# Patient Record
Sex: Male | Born: 1950
Health system: Southern US, Community
[De-identification: ages and names within clinical notes are randomized; demographics above are authoritative.]

## PROBLEM LIST (undated history)

## (undated) DIAGNOSIS — J019 Acute sinusitis, unspecified: Secondary | ICD-10-CM

## (undated) DIAGNOSIS — E119 Type 2 diabetes mellitus without complications: Secondary | ICD-10-CM

## (undated) DIAGNOSIS — Z8711 Personal history of peptic ulcer disease: Secondary | ICD-10-CM

## (undated) DIAGNOSIS — F329 Major depressive disorder, single episode, unspecified: Secondary | ICD-10-CM

## (undated) DIAGNOSIS — L989 Disorder of the skin and subcutaneous tissue, unspecified: Secondary | ICD-10-CM

## (undated) DIAGNOSIS — K279 Peptic ulcer, site unspecified, unspecified as acute or chronic, without hemorrhage or perforation: Secondary | ICD-10-CM

## (undated) DIAGNOSIS — C801 Malignant (primary) neoplasm, unspecified: Secondary | ICD-10-CM

## (undated) DIAGNOSIS — R197 Diarrhea, unspecified: Secondary | ICD-10-CM

## (undated) DIAGNOSIS — E785 Hyperlipidemia, unspecified: Secondary | ICD-10-CM

## (undated) DIAGNOSIS — F411 Generalized anxiety disorder: Secondary | ICD-10-CM

## (undated) DIAGNOSIS — G4700 Insomnia, unspecified: Secondary | ICD-10-CM

## (undated) DIAGNOSIS — E663 Overweight: Secondary | ICD-10-CM

## (undated) DIAGNOSIS — I10 Essential (primary) hypertension: Secondary | ICD-10-CM

## (undated) DIAGNOSIS — I82409 Acute embolism and thrombosis of unspecified deep veins of unspecified lower extremity: Secondary | ICD-10-CM

## (undated) DIAGNOSIS — Z9889 Other specified postprocedural states: Secondary | ICD-10-CM

## (undated) DIAGNOSIS — R112 Nausea with vomiting, unspecified: Secondary | ICD-10-CM

## (undated) HISTORY — DX: Type 2 diabetes mellitus without complications: E11.9

## (undated) HISTORY — DX: Nausea with vomiting, unspecified: R11.2

## (undated) HISTORY — DX: Personal history of peptic ulcer disease: Z87.11

## (undated) HISTORY — DX: Major depressive disorder, single episode, unspecified: F32.9

## (undated) HISTORY — DX: Generalized anxiety disorder: F41.1

## (undated) HISTORY — DX: Peptic ulcer, site unspecified, unspecified as acute or chronic, without hemorrhage or perforation: K27.9

## (undated) HISTORY — DX: Diarrhea, unspecified: R19.7

## (undated) HISTORY — DX: Acute sinusitis, unspecified: J01.90

## (undated) HISTORY — DX: Malignant (primary) neoplasm, unspecified: C80.1

## (undated) HISTORY — DX: Other specified postprocedural states: Z98.890

## (undated) HISTORY — DX: Essential (primary) hypertension: I10

## (undated) HISTORY — DX: Insomnia, unspecified: G47.00

## (undated) HISTORY — DX: Hyperlipidemia, unspecified: E78.5

## (undated) HISTORY — PX: OTHER SURGICAL HISTORY: SHX169

## (undated) HISTORY — DX: Acute embolism and thrombosis of unspecified deep veins of unspecified lower extremity: I82.409

## (undated) HISTORY — DX: Overweight: E66.3

## (undated) HISTORY — DX: Disorder of the skin and subcutaneous tissue, unspecified: L98.9

---

## 2000-02-12 ENCOUNTER — Encounter: Payer: Self-pay | Admitting: General Surgery

## 2000-02-12 ENCOUNTER — Encounter: Admission: RE | Admit: 2000-02-12 | Discharge: 2000-02-12 | Payer: Self-pay | Admitting: General Surgery

## 2000-05-02 ENCOUNTER — Encounter (INDEPENDENT_AMBULATORY_CARE_PROVIDER_SITE_OTHER): Payer: Self-pay

## 2000-05-02 ENCOUNTER — Inpatient Hospital Stay (HOSPITAL_COMMUNITY): Admission: RE | Admit: 2000-05-02 | Discharge: 2000-05-07 | Payer: Self-pay | Admitting: General Surgery

## 2004-07-17 ENCOUNTER — Ambulatory Visit: Payer: Self-pay | Admitting: Endocrinology

## 2004-10-01 ENCOUNTER — Encounter: Admission: RE | Admit: 2004-10-01 | Discharge: 2004-10-01 | Payer: Self-pay | Admitting: Internal Medicine

## 2005-05-21 ENCOUNTER — Ambulatory Visit: Payer: Self-pay | Admitting: Internal Medicine

## 2006-03-15 ENCOUNTER — Ambulatory Visit: Payer: Self-pay | Admitting: Internal Medicine

## 2006-06-16 ENCOUNTER — Ambulatory Visit: Payer: Self-pay | Admitting: Internal Medicine

## 2006-06-16 LAB — CONVERTED CEMR LAB
ALT: 37 units/L (ref 0–40)
AST: 32 units/L (ref 0–37)
Albumin: 4.2 g/dL (ref 3.5–5.2)
Alkaline Phosphatase: 56 units/L (ref 39–117)
BUN: 13 mg/dL (ref 6–23)
Basophils Absolute: 0 10*3/uL (ref 0.0–0.1)
Basophils Relative: 0.5 % (ref 0.0–1.0)
Bilirubin Urine: NEGATIVE
Bilirubin, Direct: 0.2 mg/dL (ref 0.0–0.3)
CO2: 29 meq/L (ref 19–32)
Calcium: 9.3 mg/dL (ref 8.4–10.5)
Chloride: 103 meq/L (ref 96–112)
Cholesterol: 140 mg/dL (ref 0–200)
Creatinine, Ser: 0.8 mg/dL (ref 0.4–1.5)
Eosinophils Absolute: 0.2 10*3/uL (ref 0.0–0.6)
Eosinophils Relative: 2.2 % (ref 0.0–5.0)
GFR calc Af Amer: 129 mL/min
GFR calc non Af Amer: 107 mL/min
Glucose, Bld: 111 mg/dL — ABNORMAL HIGH (ref 70–99)
HCT: 49 % (ref 39.0–52.0)
HDL: 38.8 mg/dL — ABNORMAL LOW (ref >39.0)
Hemoglobin, Urine: NEGATIVE
Hemoglobin: 17.3 g/dL — ABNORMAL HIGH (ref 13.0–17.0)
Hgb A1c MFr Bld: 5.3 % (ref 4.6–6.0)
Ketones, ur: NEGATIVE mg/dL
LDL Cholesterol: 79 mg/dL (ref 0–99)
Leukocytes, UA: NEGATIVE
Lymphocytes Relative: 17.8 % (ref 12.0–46.0)
MCHC: 35.2 g/dL (ref 30.0–36.0)
MCV: 91.8 fL (ref 78.0–100.0)
Monocytes Absolute: 0.8 10*3/uL — ABNORMAL HIGH (ref 0.2–0.7)
Monocytes Relative: 8.5 % (ref 3.0–11.0)
Neutro Abs: 7.1 10*3/uL (ref 1.4–7.7)
Neutrophils Relative %: 71 % (ref 43.0–77.0)
Nitrite: NEGATIVE
PSA: 1.66 ng/mL (ref 0.10–4.00)
Platelets: 232 10*3/uL (ref 150–400)
Potassium: 3.4 meq/L — ABNORMAL LOW (ref 3.5–5.1)
RBC: 5.34 M/uL (ref 4.22–5.81)
RDW: 11.5 % (ref 11.5–14.6)
Sodium: 140 meq/L (ref 135–145)
Specific Gravity, Urine: 1.015 (ref 1.000–1.03)
TSH: 0.86 microintl units/mL (ref 0.35–5.50)
Total Bilirubin: 1.4 mg/dL — ABNORMAL HIGH (ref 0.3–1.2)
Total CHOL/HDL Ratio: 3.6
Total Protein, Urine: NEGATIVE mg/dL
Total Protein: 7.2 g/dL (ref 6.0–8.3)
Triglycerides: 112 mg/dL (ref 0–149)
Urine Glucose: NEGATIVE mg/dL
Urobilinogen, UA: 0.2 (ref 0.0–1.0)
VLDL: 22 mg/dL (ref 0–40)
WBC: 9.8 10*3/uL (ref 4.5–10.5)
pH: 7 (ref 5.0–8.0)

## 2006-08-05 ENCOUNTER — Ambulatory Visit: Payer: Self-pay | Admitting: Internal Medicine

## 2006-11-17 ENCOUNTER — Encounter: Payer: Self-pay | Admitting: Internal Medicine

## 2006-11-17 DIAGNOSIS — I10 Essential (primary) hypertension: Secondary | ICD-10-CM

## 2006-11-17 DIAGNOSIS — Z8711 Personal history of peptic ulcer disease: Secondary | ICD-10-CM

## 2006-11-17 DIAGNOSIS — E663 Overweight: Secondary | ICD-10-CM

## 2006-11-17 HISTORY — DX: Overweight: E66.3

## 2006-11-17 HISTORY — DX: Essential (primary) hypertension: I10

## 2006-11-17 HISTORY — DX: Personal history of peptic ulcer disease: Z87.11

## 2006-11-21 DIAGNOSIS — E785 Hyperlipidemia, unspecified: Secondary | ICD-10-CM

## 2006-11-21 DIAGNOSIS — F411 Generalized anxiety disorder: Secondary | ICD-10-CM

## 2006-11-21 HISTORY — DX: Generalized anxiety disorder: F41.1

## 2006-11-21 HISTORY — DX: Hyperlipidemia, unspecified: E78.5

## 2007-07-21 ENCOUNTER — Ambulatory Visit: Payer: Self-pay | Admitting: Internal Medicine

## 2007-07-21 DIAGNOSIS — G4700 Insomnia, unspecified: Secondary | ICD-10-CM

## 2007-07-21 HISTORY — DX: Insomnia, unspecified: G47.00

## 2007-07-22 DIAGNOSIS — K279 Peptic ulcer, site unspecified, unspecified as acute or chronic, without hemorrhage or perforation: Secondary | ICD-10-CM

## 2007-07-22 DIAGNOSIS — E119 Type 2 diabetes mellitus without complications: Secondary | ICD-10-CM

## 2007-07-22 DIAGNOSIS — R7303 Prediabetes: Secondary | ICD-10-CM

## 2007-07-22 HISTORY — DX: Type 2 diabetes mellitus without complications: E11.9

## 2007-07-22 HISTORY — DX: Peptic ulcer, site unspecified, unspecified as acute or chronic, without hemorrhage or perforation: K27.9

## 2007-07-24 LAB — CONVERTED CEMR LAB
ALT: 31 units/L (ref 0–53)
AST: 25 units/L (ref 0–37)
Albumin: 4.3 g/dL (ref 3.5–5.2)
Alkaline Phosphatase: 48 units/L (ref 39–117)
BUN: 15 mg/dL (ref 6–23)
Basophils Absolute: 0 10*3/uL (ref 0.0–0.1)
Basophils Relative: 0 % (ref 0.0–1.0)
Bilirubin Urine: NEGATIVE
Bilirubin, Direct: 0.1 mg/dL (ref 0.0–0.3)
CO2: 32 meq/L (ref 19–32)
Calcium: 9.6 mg/dL (ref 8.4–10.5)
Chloride: 106 meq/L (ref 96–112)
Cholesterol: 139 mg/dL (ref 0–200)
Creatinine, Ser: 0.8 mg/dL (ref 0.4–1.5)
Eosinophils Absolute: 0.1 10*3/uL (ref 0.0–0.7)
Eosinophils Relative: 1.8 % (ref 0.0–5.0)
GFR calc Af Amer: 128 mL/min
GFR calc non Af Amer: 106 mL/min
Glucose, Bld: 100 mg/dL — ABNORMAL HIGH (ref 70–99)
HCT: 47.4 % (ref 39.0–52.0)
HDL: 36.1 mg/dL — ABNORMAL LOW (ref >39.0)
Hemoglobin, Urine: NEGATIVE
Hemoglobin: 16.1 g/dL (ref 13.0–17.0)
Hgb A1c MFr Bld: 5.5 % (ref 4.6–6.0)
Ketones, ur: NEGATIVE mg/dL
LDL Cholesterol: 78 mg/dL (ref 0–99)
Leukocytes, UA: NEGATIVE
Lymphocytes Relative: 16.8 % (ref 12.0–46.0)
MCHC: 33.9 g/dL (ref 30.0–36.0)
MCV: 94.9 fL (ref 78.0–100.0)
Monocytes Absolute: 0.9 10*3/uL (ref 0.1–1.0)
Monocytes Relative: 10.7 % (ref 3.0–12.0)
Neutro Abs: 5.7 10*3/uL (ref 1.4–7.7)
Neutrophils Relative %: 70.7 % (ref 43.0–77.0)
Nitrite: NEGATIVE
PSA: 1.64 ng/mL (ref 0.10–4.00)
Platelets: 208 10*3/uL (ref 150–400)
Potassium: 3.2 meq/L — ABNORMAL LOW (ref 3.5–5.1)
RBC: 5 M/uL (ref 4.22–5.81)
RDW: 12.4 % (ref 11.5–14.6)
Sodium: 142 meq/L (ref 135–145)
Specific Gravity, Urine: 1.015 (ref 1.000–1.03)
TSH: 0.85 microintl units/mL (ref 0.35–5.50)
Total Bilirubin: 1.1 mg/dL (ref 0.3–1.2)
Total CHOL/HDL Ratio: 3.9
Total Protein, Urine: NEGATIVE mg/dL
Total Protein: 7.5 g/dL (ref 6.0–8.3)
Triglycerides: 126 mg/dL (ref 0–149)
Urine Glucose: NEGATIVE mg/dL
Urobilinogen, UA: 0.2 (ref 0.0–1.0)
VLDL: 25 mg/dL (ref 0–40)
WBC: 8 10*3/uL (ref 4.5–10.5)
pH: 8 (ref 5.0–8.0)

## 2007-07-25 ENCOUNTER — Encounter: Payer: Self-pay | Admitting: Internal Medicine

## 2007-07-26 ENCOUNTER — Telehealth: Payer: Self-pay | Admitting: Internal Medicine

## 2007-07-30 ENCOUNTER — Encounter: Payer: Self-pay | Admitting: Internal Medicine

## 2007-08-02 ENCOUNTER — Encounter: Payer: Self-pay | Admitting: Internal Medicine

## 2007-08-04 ENCOUNTER — Telehealth: Payer: Self-pay | Admitting: Internal Medicine

## 2007-12-08 ENCOUNTER — Ambulatory Visit: Payer: Self-pay | Admitting: Internal Medicine

## 2007-12-08 DIAGNOSIS — J019 Acute sinusitis, unspecified: Secondary | ICD-10-CM

## 2007-12-08 DIAGNOSIS — L989 Disorder of the skin and subcutaneous tissue, unspecified: Secondary | ICD-10-CM

## 2007-12-08 HISTORY — DX: Disorder of the skin and subcutaneous tissue, unspecified: L98.9

## 2007-12-08 HISTORY — DX: Acute sinusitis, unspecified: J01.90

## 2008-02-25 IMAGING — CR DG CHEST 2V
2 series · 2 of 2 positions shown · non-contrast
Comparison: 07/17/04.

CLINICAL DATA: Cough and fever. 
 CHEST - 2 VIEW:

[view not recorded (1 of 2)]
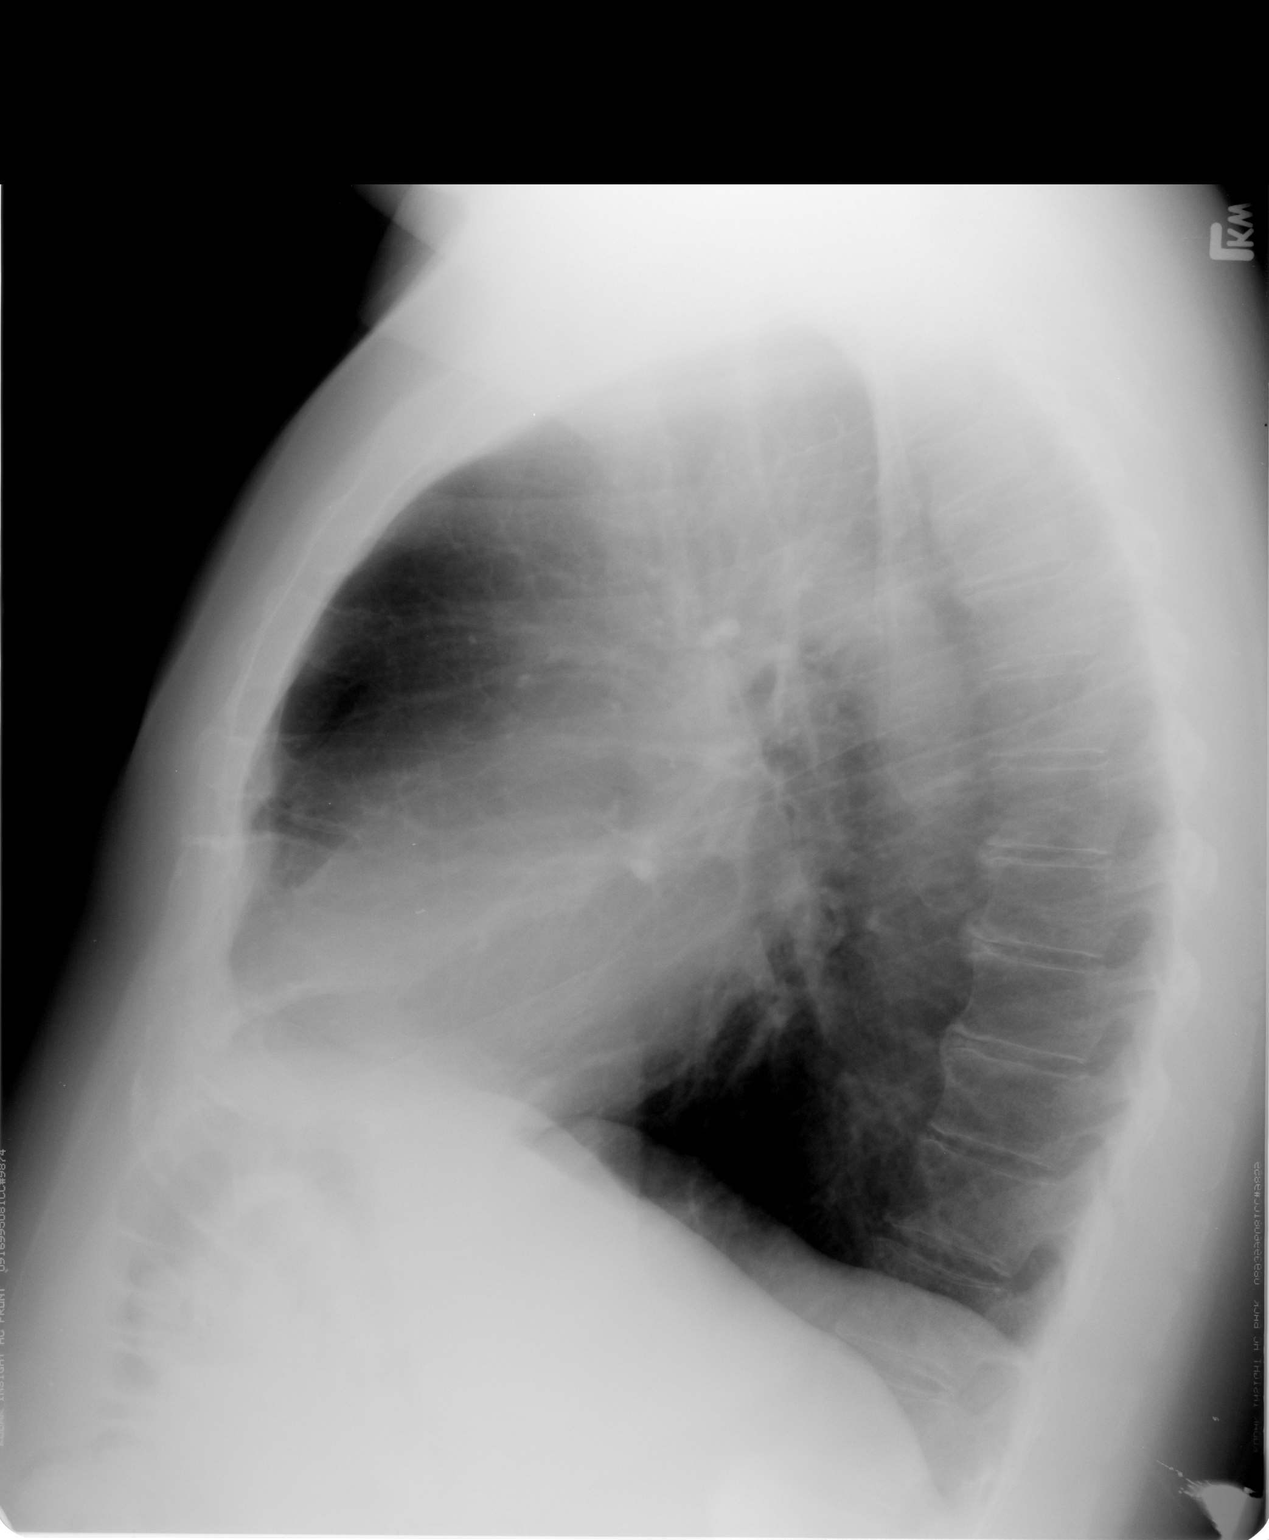

[view not recorded (2 of 2)]
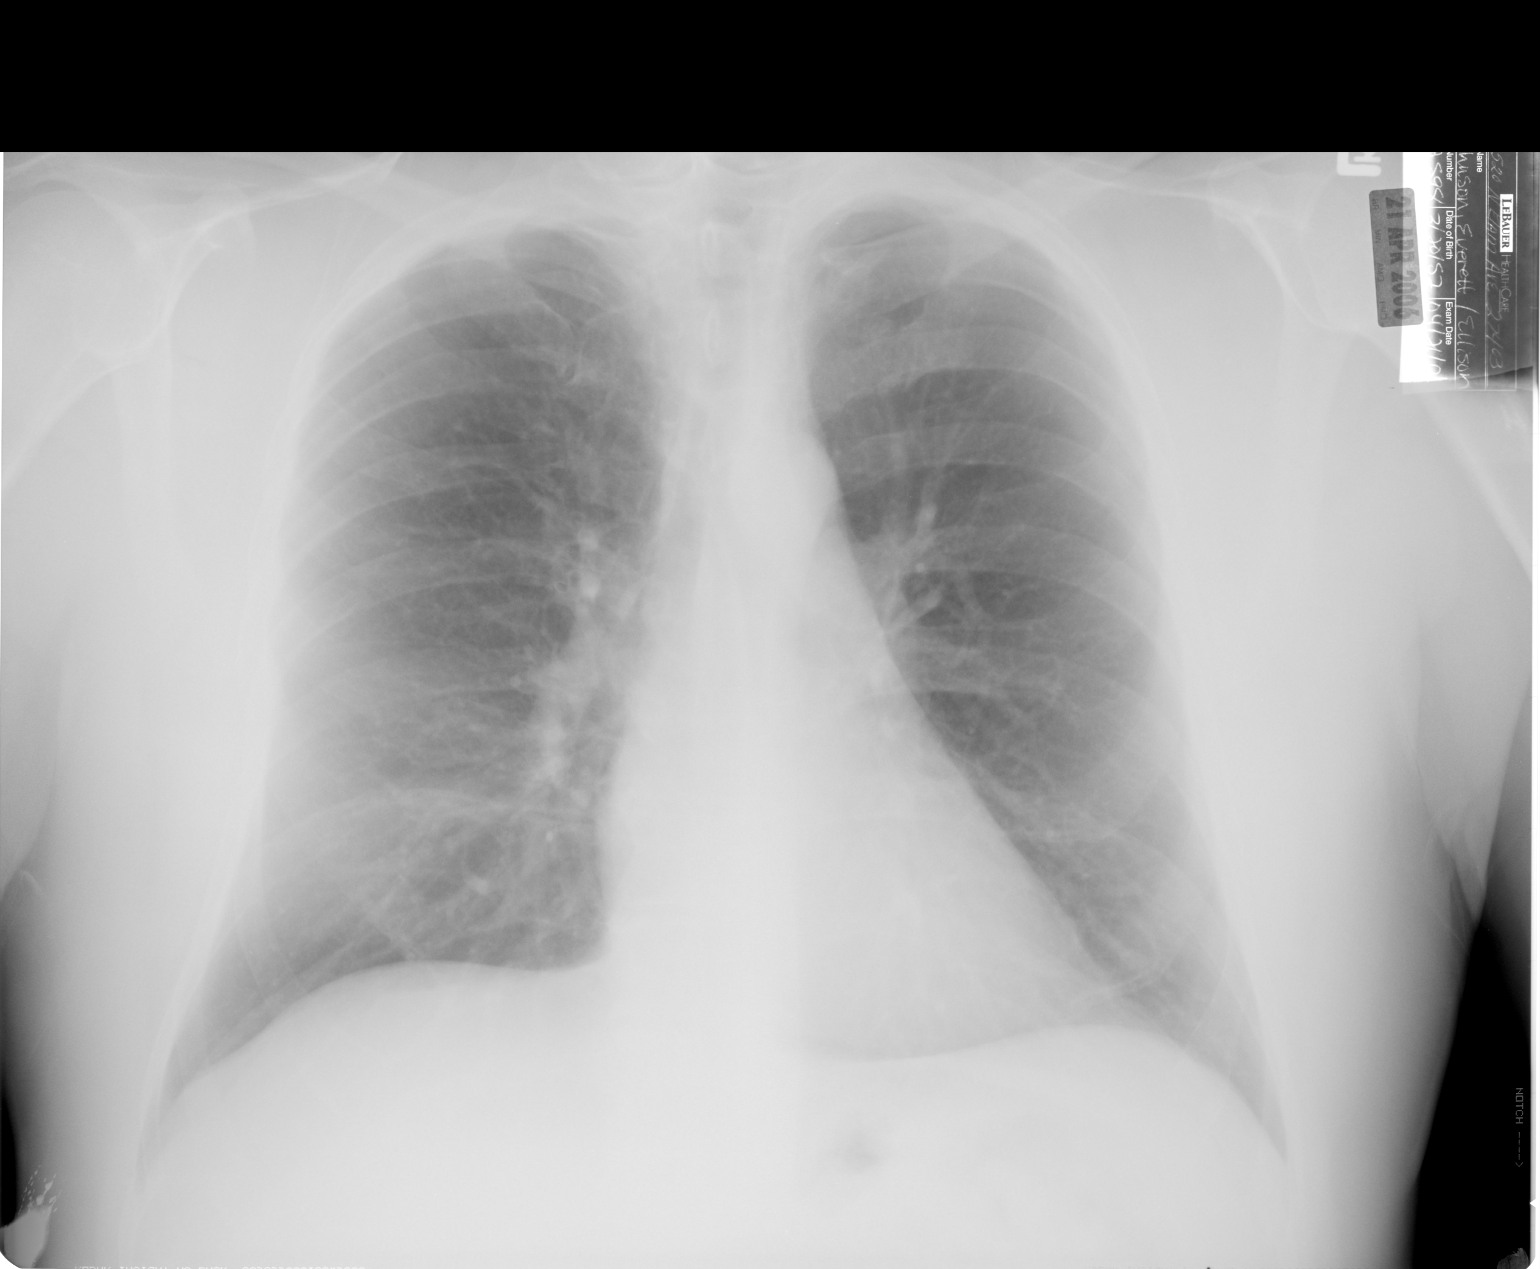

[2 of 2 positions shown; findings below may reference images not displayed]

FINDINGS: The lungs are clear.  Heart size is normal.   No effusion or focal bony abnormality.
IMPRESSION: No acute disease.  Stable compared to prior exam.

## 2008-04-16 ENCOUNTER — Telehealth: Payer: Self-pay | Admitting: Internal Medicine

## 2008-04-18 ENCOUNTER — Telehealth: Payer: Self-pay | Admitting: Internal Medicine

## 2008-05-31 ENCOUNTER — Ambulatory Visit: Payer: Self-pay | Admitting: Internal Medicine

## 2008-05-31 ENCOUNTER — Telehealth: Payer: Self-pay | Admitting: Internal Medicine

## 2008-05-31 DIAGNOSIS — F3289 Other specified depressive episodes: Secondary | ICD-10-CM

## 2008-05-31 DIAGNOSIS — F329 Major depressive disorder, single episode, unspecified: Secondary | ICD-10-CM

## 2008-05-31 DIAGNOSIS — R197 Diarrhea, unspecified: Secondary | ICD-10-CM | POA: Insufficient documentation

## 2008-05-31 HISTORY — DX: Other specified depressive episodes: F32.89

## 2008-05-31 HISTORY — DX: Major depressive disorder, single episode, unspecified: F32.9

## 2008-05-31 HISTORY — DX: Diarrhea, unspecified: R19.7

## 2008-07-29 ENCOUNTER — Ambulatory Visit: Payer: Self-pay | Admitting: Internal Medicine

## 2008-07-29 ENCOUNTER — Encounter: Payer: Self-pay | Admitting: Internal Medicine

## 2008-07-30 ENCOUNTER — Encounter: Payer: Self-pay | Admitting: Internal Medicine

## 2008-07-30 LAB — CONVERTED CEMR LAB
ALT: 27 units/L (ref 0–53)
AST: 22 units/L (ref 0–37)
Albumin: 4.4 g/dL (ref 3.5–5.2)
Alkaline Phosphatase: 60 units/L (ref 39–117)
BUN: 18 mg/dL (ref 6–23)
Basophils Absolute: 0 10*3/uL (ref 0.0–0.1)
Basophils Relative: 0.2 % (ref 0.0–3.0)
Bilirubin Urine: NEGATIVE
Bilirubin, Direct: 0.2 mg/dL (ref 0.0–0.3)
CO2: 34 meq/L — ABNORMAL HIGH (ref 19–32)
Calcium: 9.9 mg/dL (ref 8.4–10.5)
Chloride: 102 meq/L (ref 96–112)
Cholesterol: 183 mg/dL (ref 0–200)
Creatinine, Ser: 0.9 mg/dL (ref 0.4–1.5)
Eosinophils Absolute: 0.2 10*3/uL (ref 0.0–0.7)
Eosinophils Relative: 1.4 % (ref 0.0–5.0)
GFR calc non Af Amer: 92.08 mL/min (ref >60)
Glucose, Bld: 107 mg/dL — ABNORMAL HIGH (ref 70–99)
HCT: 48.6 % (ref 39.0–52.0)
HDL: 37.2 mg/dL — ABNORMAL LOW (ref >39.00)
Hemoglobin, Urine: NEGATIVE
Hemoglobin: 17.5 g/dL — ABNORMAL HIGH (ref 13.0–17.0)
Ketones, ur: NEGATIVE mg/dL
LDL Cholesterol: 112 mg/dL — ABNORMAL HIGH (ref 0–99)
Leukocytes, UA: NEGATIVE
Lymphocytes Relative: 8.7 % — ABNORMAL LOW (ref 12.0–46.0)
Lymphs Abs: 1.1 10*3/uL (ref 0.7–4.0)
MCHC: 36 g/dL (ref 30.0–36.0)
MCV: 94.5 fL (ref 78.0–100.0)
Monocytes Absolute: 1 10*3/uL (ref 0.1–1.0)
Monocytes Relative: 7.8 % (ref 3.0–12.0)
Neutro Abs: 10.2 10*3/uL — ABNORMAL HIGH (ref 1.4–7.7)
Neutrophils Relative %: 81.9 % — ABNORMAL HIGH (ref 43.0–77.0)
Nitrite: NEGATIVE
PSA: 2.1 ng/mL (ref 0.10–4.00)
Platelets: 198 10*3/uL (ref 150.0–400.0)
Potassium: 3.6 meq/L (ref 3.5–5.1)
RBC: 5.14 M/uL (ref 4.22–5.81)
RDW: 12.4 % (ref 11.5–14.6)
Sodium: 140 meq/L (ref 135–145)
Specific Gravity, Urine: 1.015 (ref 1.000–1.030)
TSH: 1.03 microintl units/mL (ref 0.35–5.50)
Total Bilirubin: 1.5 mg/dL — ABNORMAL HIGH (ref 0.3–1.2)
Total CHOL/HDL Ratio: 5
Total Protein, Urine: NEGATIVE mg/dL
Total Protein: 7.9 g/dL (ref 6.0–8.3)
Triglycerides: 170 mg/dL — ABNORMAL HIGH (ref 0.0–149.0)
Urine Glucose: NEGATIVE mg/dL
Urobilinogen, UA: 2 (ref 0.0–1.0)
VLDL: 34 mg/dL (ref 0.0–40.0)
WBC: 12.5 10*3/uL — ABNORMAL HIGH (ref 4.5–10.5)
pH: 7 (ref 5.0–8.0)

## 2008-08-05 ENCOUNTER — Ambulatory Visit: Payer: Self-pay | Admitting: Internal Medicine

## 2008-08-05 LAB — CONVERTED CEMR LAB
Basophils Absolute: 0 10*3/uL (ref 0.0–0.1)
Basophils Relative: 0.1 % (ref 0.0–3.0)
Eosinophils Absolute: 0.1 10*3/uL (ref 0.0–0.7)
Eosinophils Relative: 0.9 % (ref 0.0–5.0)
HCT: 45.4 % (ref 39.0–52.0)
Hemoglobin: 16.2 g/dL (ref 13.0–17.0)
Lymphocytes Relative: 9.7 % — ABNORMAL LOW (ref 12.0–46.0)
Lymphs Abs: 0.9 10*3/uL (ref 0.7–4.0)
MCHC: 35.8 g/dL (ref 30.0–36.0)
MCV: 95.1 fL (ref 78.0–100.0)
Monocytes Absolute: 0.7 10*3/uL (ref 0.1–1.0)
Monocytes Relative: 6.8 % (ref 3.0–12.0)
Neutro Abs: 8 10*3/uL — ABNORMAL HIGH (ref 1.4–7.7)
Neutrophils Relative %: 82.5 % — ABNORMAL HIGH (ref 43.0–77.0)
Platelets: 208 10*3/uL (ref 150.0–400.0)
RBC: 4.78 M/uL (ref 4.22–5.81)
RDW: 12.3 % (ref 11.5–14.6)
WBC: 9.7 10*3/uL (ref 4.5–10.5)

## 2008-10-01 ENCOUNTER — Telehealth (INDEPENDENT_AMBULATORY_CARE_PROVIDER_SITE_OTHER): Payer: Self-pay | Admitting: *Deleted

## 2009-04-16 ENCOUNTER — Encounter: Payer: Self-pay | Admitting: Internal Medicine

## 2009-05-02 ENCOUNTER — Encounter: Payer: Self-pay | Admitting: Internal Medicine

## 2009-07-17 ENCOUNTER — Telehealth: Payer: Self-pay | Admitting: Internal Medicine

## 2009-11-03 ENCOUNTER — Ambulatory Visit: Payer: Self-pay | Admitting: Internal Medicine

## 2009-11-03 LAB — CONVERTED CEMR LAB
ALT: 26 units/L (ref 0–53)
Alkaline Phosphatase: 63 units/L (ref 39–117)
BUN: 15 mg/dL (ref 6–23)
Basophils Relative: 0.3 % (ref 0.0–3.0)
Bilirubin, Direct: 0.2 mg/dL (ref 0.0–0.3)
CO2: 30 meq/L (ref 19–32)
Chloride: 102 meq/L (ref 96–112)
GFR calc non Af Amer: 92.87 mL/min (ref >60)
Glucose, Bld: 94 mg/dL (ref 70–99)
HCT: 51.2 % (ref 39.0–52.0)
HDL: 32.6 mg/dL — ABNORMAL LOW (ref >39.00)
Hemoglobin, Urine: NEGATIVE
Hemoglobin: 17.9 g/dL — ABNORMAL HIGH (ref 13.0–17.0)
MCHC: 35 g/dL (ref 30.0–36.0)
MCV: 94.2 fL (ref 78.0–100.0)
Monocytes Relative: 9.8 % (ref 3.0–12.0)
Platelets: 213 10*3/uL (ref 150.0–400.0)
Potassium: 3.8 meq/L (ref 3.5–5.1)
RDW: 12.7 % (ref 11.5–14.6)
Sodium: 141 meq/L (ref 135–145)
TSH: 0.99 microintl units/mL (ref 0.35–5.50)
Total CHOL/HDL Ratio: 6
Total Protein: 7.8 g/dL (ref 6.0–8.3)
Urine Glucose: NEGATIVE mg/dL
Urobilinogen, UA: 0.2 (ref 0.0–1.0)

## 2010-04-28 NOTE — Letter (Signed)
Summary: [facility]  [facility]   Imported By: Sherian Rein 05/14/2009 15:17:15  _____________________________________________________________________  External Attachment:    Type:   Image     Comment:   External Document

## 2010-04-28 NOTE — Assessment & Plan Note (Signed)
Summary: F/U APPT PER PT/#/CD   Vital Signs:  Patient profile:   60 year old male Height:      69 inches Weight:      233 pounds BMI:     34.53 O2 Sat:      96 % on Room air Temp:     98.6 degrees F oral Pulse rate:   55 / minute BP sitting:   128 / 80  (left arm) Cuff size:   large  Vitals Entered By: Zella Ball Ewing CMA (AAMA) (November 03, 2009 10:18 AM)  O2 Flow:  Room air  Preventive Care Screening     decliens colonoscopy  CC: followup/RE Pain Assessment Patient in pain? no        CC:  followup/RE.  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  Still with chronic LLE sweling and vein incompteance so he spent $5000 to fix the vein.  Trying to follow lower chol diet and vytorin now too expensvie.    Problems Prior to Update: 1)  Preventive Health Care  (ICD-V70.0) 2)  Depression  (ICD-311) 3)  Diarrhea  (ICD-787.91) 4)  Skin Lesion  (ICD-709.9) 5)  Sinusitis- Acute-nos  (ICD-461.9) 6)  Diabetes Mellitus, Type II  (ICD-250.00) 7)  Peptic Ulcer Disease  (ICD-533.90) 8)  Insomnia-sleep Disorder-unspec  (ICD-780.52) 9)  Preventive Health Care  (ICD-V70.0) 10)  Anxiety  (ICD-300.00) 11)  Obesity, Mild  (ICD-278.02) 12)  Peptic Ulcer Disease, Hx of  (ICD-V12.71) 13)  Hyperlipidemia  (ICD-272.4) 14)  Hypertension  (ICD-401.9)  Medications Prior to Update: 1)  Ecotrin Low Strength 81 Mg  Tbec (Aspirin) .Marland Kitchen.. 1 By Mouth Once Daily 2)  Vytorin 10-40 Mg  Tabs (Ezetimibe-Simvastatin) .Marland Kitchen.. 1 By Mouth Once Daily 3)  Atenolol-Chlorthalidone 100-25 Mg  Tabs (Atenolol-Chlorthalidone) .Marland Kitchen.. 1 By Mouth Once Daily 4)  Ambien Cr 12.5 Mg  Cr-Tabs (Zolpidem Tartrate) .Marland Kitchen.. 1po At Bedtime As Needed 5)  Klor-Con M10 10 Meq  Tbcr (Potassium Chloride Crys Cr) .Marland Kitchen.. 1 By Mouth Once Daily 6)  Amlodipine Besy-Benazepril Hcl 5-20 Mg Caps (Amlodipine Besy-Benazepril Hcl) .... Take 2 Capsule By Mouth Once A Day 7)  Fluoxetine Hcl 20 Mg Tabs (Fluoxetine Hcl) .Marland Kitchen.. 1po Once Daily 8)  Amlodipine Besylate 5 Mg Tabs (Amlodipine Besylate) .Marland Kitchen.. 1 By Mouth Once Daily 9)  Benazepril Hcl 20 Mg Tabs (Benazepril Hcl) .Marland Kitchen.. 1 By Mouth Once Daily 10)  Amlodipine Besylate 10 Mg Tabs (Amlodipine Besylate) .Marland Kitchen.. 1 By Mouth Once Daily 11)  Benazepril Hcl 40 Mg Tabs (Benazepril Hcl) .Marland Kitchen.. 1 By Mouth Once Daily  Current Medications (verified): 1)  Ecotrin Low Strength 81 Mg  Tbec (Aspirin) .Marland Kitchen.. 1 By Mouth Once Daily 2)  Simvastatin 20 Mg Tabs (Simvastatin) .Marland Kitchen.. 1po Once Daily 3)  Atenolol-Chlorthalidone 100-25 Mg  Tabs (Atenolol-Chlorthalidone) .Marland Kitchen.. 1 By Mouth Once Daily 4)  Klor-Con M10 10 Meq  Tbcr (Potassium Chloride Crys Cr) .Marland Kitchen.. 1 By Mouth Once Daily 5)  Fluoxetine Hcl 20 Mg Tabs (Fluoxetine Hcl) .Marland Kitchen.. 1po Once Daily 6)  Amlodipine Besylate 10 Mg Tabs (Amlodipine Besylate) .Marland Kitchen.. 1 By Mouth Once Daily 7)  Benazepril Hcl 40 Mg  Tabs (Benazepril Hcl) .Marland Kitchen.. 1 By Mouth Once Daily  Allergies (verified): 1)  ! Lipitor 2)  ! Pravachol 3)  ! 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         all otherwise negative per pt -    Physical Exam  General:  alert and overweight-appearing.   Head:  normocephalic and atraumatic.   Eyes:  vision grossly intact, pupils equal, and pupils round.   Ears:  R ear normal and L ear normal.   Nose:  no external deformity and no nasal discharge.   Mouth:  no gingival abnormalities and pharynx pink and moist.   Neck:  supple and no masses.   Lungs:  normal respiratory effort and normal breath sounds.   Heart:  normal rate and regular rhythm.   Abdomen:  soft, non-tender, and normal bowel sounds.   Msk:  no joint tenderness and no joint swelling.   Extremities:  no edema, no erythema  Neurologic:  cranial nerves II-XII intact and strength normal in all extremities.   Skin:  color normal and no rashes.   Psych:  normally interactive, good eye contact, and moderately anxious.     Impression & Recommendations:  Problem # 1:  Preventive Health Care (ICD-V70.0)  Overall doing well, age appropriate education and counseling updated and referral for appropriate preventive services done unless declined, immunizations up to date or declined, diet counseling done if overweight, urged to quit smoking if smokes , most recent labs reviewed and  current ordered if appropriate, ecg reviewed or declined (interpretation per ECG scanned in the EMR if done); information regarding Medicare Prevention requirements given if appropriate; speciality referrals updated as appropriate   Orders: TLB-BMP (Basic Metabolic Panel-BMET) (80048-METABOL) TLB-CBC Platelet - w/Differential (85025-CBCD) TLB-Hepatic/Liver Function Pnl (80076-HEPATIC) TLB-Lipid Panel (80061-LIPID) TLB-TSH (Thyroid Stimulating Hormone) (84443-TSH) TLB-PSA (Prostate Specific Antigen) (84153-PSA) TLB-Udip ONLY (81003-UDIP)  Problem # 2:  HYPERLIPIDEMIA (ICD-272.4)  His updated medication list for this problem includes:    Simvastatin 20 Mg Tabs (Simvastatin) .Marland Kitchen... 1po once daily treat as above, f/u any worsening signs or symptoms  - lower dose due to concomitant amlodipine  Complete Medication List: 1)  Ecotrin Low Strength 81 Mg Tbec (Aspirin) .Marland Kitchen.. 1 by mouth once daily 2)  Simvastatin 20 Mg Tabs (Simvastatin) .Marland Kitchen.. 1po once daily 3)  Atenolol-chlorthalidone 100-25 Mg Tabs (Atenolol-chlorthalidone) .Marland Kitchen.. 1 by mouth once daily 4)  Klor-con M10 10 Meq Tbcr (Potassium chloride crys cr) .Marland Kitchen.. 1 by  mouth once daily 5)  Fluoxetine Hcl 20 Mg Tabs (Fluoxetine hcl) .Marland Kitchen.. 1po once daily 6)  Amlodipine Besylate 10 Mg Tabs (Amlodipine besylate) .Marland Kitchen.. 1 by mouth once daily 7)  Benazepril Hcl 40 Mg Tabs (Benazepril hcl) .Marland Kitchen.. 1 by mouth once daily  Patient Instructions: 1)  stop the vytorin 2)  start the simvastatin at 20 mg per day 3)  Continue all other previous medications as before this visit 4)  Please go to the Lab in the basement for your blood and/or urine tests today 5)  Please schedule a follow-up appointment in 1 year or sooner if needed 6)  Check your Blood Pressure regularly. If it is above 140/90: you should make an appointment. Prescriptions: BENAZEPRIL HCL 40 MG TABS (BENAZEPRIL HCL) 1 by mouth once daily  #90 x 3   Entered and Authorized by:   [name] MD   Signed  by:   [name] MD on 11/03/2009   Method used:   Print then Give to Patient   RxID:   [id number] AMLODIPINE BESYLATE 10 MG TABS (AMLODIPINE BESYLATE) 1 by mouth once daily  #90 x 3   Entered and Authorized by:   [name] MD   Signed by:   [name] MD on 11/03/2009   Method used:   Print then Give to Patient   RxID:   [id number] BENAZEPRIL HCL 20 MG TABS (BENAZEPRIL HCL) 1 by mouth once daily  #90 x 3   Entered and Authorized by:   [name] MD   Signed by:   [name] MD on 11/03/2009   Method used:   Print then Give to Patient   RxID:   [id number] AMLODIPINE BESYLATE 5 MG TABS (AMLODIPINE BESYLATE) 1 by mouth once daily  #90 x 3   Entered and Authorized by:   [name] MD   Signed by:   [name] MD on 11/03/2009   Method used:   Print then Give to Patient   RxID:   [id number] FLUOXETINE HCL 20 MG TABS (FLUOXETINE HCL) 1po once daily  #90 x 3   Entered and Authorized by:   [name] MD   Signed by:   [name] MD on 11/03/2009   Method used:   Print then Give to Patient   RxID:   [id number] KLOR-CON M10 10 MEQ  TBCR (POTASSIUM CHLORIDE CRYS CR) 1 by mouth once daily  #90 x 3   Entered and Authorized by:   [name] MD   Signed by:   [name] MD on 11/03/2009   Method used:   Print then Give to Patient   RxID:   [id number] ATENOLOL-CHLORTHALIDONE 100-25 MG  TABS (ATENOLOL-CHLORTHALIDONE) 1 by mouth once daily  #90 x 3   Entered and Authorized by:   [name] MD   Signed by:   [name] MD on 11/03/2009   Method used:   Print then Give to Patient   RxID:   [id number] SIMVASTATIN 20 MG TABS (SIMVASTATIN) 1po once daily  #90 x 3   Entered and Authorized by:   [name] MD   Signed by:   [name] MD on 11/03/2009   Method used:   Print then Give to Patient   RxID:   [id number]

## 2010-04-28 NOTE — Progress Notes (Signed)
Summary: Medication   Phone Note From Pharmacy   Caller: [facility] [address] [employer] of Call: Pharmacy called stating new prescription for Amlodipine 5mg  and Benazepril 20mg  both 1 by mouth once daily should be taken as 1 by mouth two times a day.Pharmacy stated pt was getting the combination as 1 by mouth two times a day . Initial call taken by: Scharlene Gloss,  July 17, 2009 4:17 PM  Follow-up for Phone Call        change amlodipine to 10 mg per day, and benazepril to 40 mg per day - to robin to handle Follow-up by: [name] MD,  July 17, 2009 4:29 PM    New/Updated Medications: AMLODIPINE BESYLATE 10 MG TABS (AMLODIPINE BESYLATE) 1 by mouth once daily BENAZEPRIL HCL 40 MG TABS (BENAZEPRIL HCL) 1 by mouth once daily Prescriptions: BENAZEPRIL HCL 40 MG TABS (BENAZEPRIL HCL) 1 by mouth once daily  #30 x 2   Entered by:   Scharlene Gloss   Authorized by:   [name] MD   Signed by:   Scharlene Gloss on 07/17/2009   Method used:   Faxed to ...       [facility] [address] #[id number]* (retail)       [address] [place], [place]  [address]       Ph: [phone]       Fax: [phone]   RxID:   [id number] AMLODIPINE BESYLATE 10 MG TABS (AMLODIPINE BESYLATE) 1 by mouth once daily  #30 x 2   Entered by:   Scharlene Gloss   Authorized by:   [name] MD   Signed by:   Scharlene Gloss on 07/17/2009   Method used:   Faxed to ...       [facility] [address] #[id number]* (retail)       [address] [place], [place]  [address]       Ph: [phone]       Fax: [phone]   RxID:   [id number]

## 2010-04-28 NOTE — Letter (Signed)
Summary: [facility]  [facility]   Imported By: Lester [place] 06/12/2009 10:49:32  _____________________________________________________________________  External Attachment:    Type:   Image     Comment:   External Document

## 2010-08-14 NOTE — Op Note (Signed)
[facility]  Patient:    Joel King, Joel King                    MRN: [id number] Proc. Date: 05/02/00 Adm. Date:  [id number] Attending:  Brandy Hale CC:         [name], M.D. [facility]   Operative Report  PREOPERATIVE DIAGNOSES: 1. Ventral hernia, recurrent. 2. Bilateral inguinal hernias.  POSTOPERATIVE DIAGNOSES: 1. Incarcerated, recurrent ventral incisional hernia. 2. Meckels diverticulum. 3. Bilateral inguinal hernias (direct type).  OPERATIONS PERFORMED: 1. Repair incarcerated recurrent ventral incisional hernia with mesh. 2. Meckels diverticulectomy. 3. Partial omentectomy. 4. Repair bilateral inguinal hernias with mesh Armanda Heritage repair).  SURGEON:  Angelia Mould. Derrell Lolling, M.D.  FIRST ASSISTANT:  Abigail Miyamoto, M.D.  OPERATIVE INDICATIONS:  This is a 60 year old gentleman who had large umbilical hernia repaired in the 1980s with primary repair with Prolene sutures.  He did well from that.  He thinks the incision is somewhat lumpy around the umbilicus.  He has had a left inguinal hernia for at least a year and would like to have this repaired.  On exam, he had moderately large bilateral inguinal hernias and a lumpy incision, suggesting a recurrent ventral hernia.  A CT scan confirmed the presence of bilateral inguinal hernias and a large ventral hernia centered around the umbilicus with at least one loop of nonobstructive small bowel present.  No other abnormalities on CT were noted.  He is brought to the operating room electively for repair of his incarcerated recurrent ventral hernia and repair of his bilateral inguinal hernias.  DESCRIPTION OF PROCEDURE:  Following the induction of general endotracheal anesthesia, a Foley catheter was inserted.  The patients abdomen and genitalia were prepped and draped in a sterile fashion.  A curved, transverse incision was made below the umbilicus, using part of the previous  incision. Dissection was carried down to the subcutaneous tissue.  We had to dissect out numerous hernia sacs to free up the fascia all the way around.  We ultimately identified what appeared to be a loop of small bowel, but on further dissection, we found that the patient had a long Meckels diverticulum incarcerating the hernia.  The tip of the Meckels diverticulum looked like it was chronically inflamed and densely scarred to the surrounding fat.  The Meckels diverticulum was about 10 cm in length.  The rest of the small bowel proximal and distal to this was normal.  Dr. Magnus Ivan and I felt that the diverticulum was at risk for future adhesions, and the tip of it looked chronically inflamed, and we were concerned and felt that it would be worth the risk to take this out.  We blocked the incision off and elevated the small bowel.  We transected the Meckels diverticulum with the GIA stapling device and removed without handling it with anything but instruments.  We painted the staple line on the small bowel with Betadine.  The staple line was a transverse incision.  We did not compromise the lumen of the small bowel whatsoever.  We let the small bowel return to the abdominal cavity.  We had several loops of omentum which had holes in them and were ischemic from scar tissue.  We resected a moderate amount of the omentum.  Multiple Kelly clamps were placed, and the omentum was divided.  The omental vessels were ligated with 2-0 silk ties.  We had excellent hemostasis.  We undermined the subcutaneous tissue off of the anterior abdominal  fascia circumferentially using electrocautery, getting about a 4 cm margin in all directions.  We debrided all of the hernia sacs away until the edges of the fascia were fairly healthy.  We closed the fascia in the midline transversely using interrupted sutures of #1 Novofil.  We reinforced the repair with an onlay graft of polypropylene mesh.  The mesh was  about 6 inches transversely by about 5 inches vertically and cut into an elliptical shape.  The mesh was sutured in place with about 12 interrupted mattress sutures of 0 Prolene. This provided a very secure and wide coverage of the fascia and the fascial closure.  The wound was irrigated with saline.  Hemostasis was excellent.  We placed two 19 French Blake drains in the wound and brought them out through separate stab incisions on the right side.  The drains were sutured to the skin with nylon sutures and connected to suction vaults.  The subcutaneous tissue was closed with interrupted sutures of 2-0 Vicryl.  The skin was closed with skin staples.  At this point, we chose to reprep and redrape and use new instruments.  The patient was given further antibiotics.  The patient had bilateral direct inguinal hernias, and they will be dictated simultaneously, as the repair was quite similar.  Bilateral transverse oblique incisions were made overlying the inguinal canal.  Dissection was carried down to the subcutaneous tissue.  The aponeurosis of the external oblique was exposed and incised in the direction of its fibers, opening up the external inguinal ring.  Sensory nerves were preserved.  Cord structures were mobilized and encircled with a Penrose drain.  Premesenteric muscle fibers were skeletonized.  On both sides, the patient was found to have moderately large direct inguinal hernia.  Careful search was made on both sides for indirect hernia sac, and there was no evidence of indirect hernia sac on either side. The direct hernias were reduced, and the overlying soft tissue oversewn with a running suture of 2-0 Vicryl to keep it in place.  On each side, we repaired and reinforced the floor of the inguinal canal with an onlay graft of polypropylene mesh.  The mesh was cut in appropriate shape and sutured in place with running sutures of 2-0 Prolene.  The mesh was initially sutured so as to  generously overlap the fascia at the pubic tubercle, then along the  inguinal ligament inferiorly, then along the internal oblique and transversus abdominis superiorly.  The mesh on each side was then incised laterally so it would wrap around the cord structures at the internal ring.  The tails of the mesh were overlapped laterally and the suture lines completed bilaterally.  On each side, this provided a very secure repair both medial and lateral to the cord structures but allowed an adequate opening for the cord structures.  The wounds were irrigated with saline.  On each side, the external oblique was closed with a running suture of 2-0 Vicryl.  On each side, the Scarpas fascia was closed with 3-0 Vicryl sutures and on each side, the skin was closed with skin staples.  Clean bandages were placed and the patient taken to the recovery room in stable condition.  Estimated blood loss was about 75-100 cc.  Complications none.  Sponge, needle and instrument counts were correct. DD:  05/02/00 TD:  05/03/00 Job: [id number]

## 2010-08-14 NOTE — Discharge Summary (Signed)
[facility]  Patient:    Joel King, Joel King                     MRN: [id number] Proc. Date: 05/19/00 Adm. Date:  05/02/00 Disc. Date: 05/07/00 Attending:  Angelia Mould. Derrell Lolling, M.D. CC:         Joel King, M.D. [facility]   Discharge Summary  FINAL DIAGNOSES:  1. Incarcerated recurrent ventral hernia.  2. Meckels diverticulum.  3. Bilateral inguinal hernias.  4. Hypertension.  OPERATION PERFORMED:  Repair incarcerated recurrent ventral hernia with mesh, Meckels diverticulectomy, partial omentectomy, repair of bilateral inguinal hernias with mesh.  HISTORY OF PRESENT ILLNESS:  This is a 60 year old gentleman who had a large umbilical hernia repaired in the 1980s with primary repair with Prolene sutures. He did well from that. He has thought that the incision was somewhat lumpy. He has had a left inguinal hernia for at least a year, this bothers him and he requested repair. On exam, he has moderately large bilateral inguinal hernias and a lumpy abdominal incision suggesting recurrent hernia. A CT scan confirmed the presence of bilateral inguinal hernias and a large ventral hernia centered around the umbilicus with at least one loop of nonobstructive small bowel present. He was admitted to the hospital electively for repair of his multiple hernias.  For details of his past history, social history, and family history, please see the detailed admission note.  PHYSICAL EXAMINATION:  GENERAL:  Pleasant middle aged gentleman. Slightly over weight. Somewhat anxious.  LUNGS:  Clear to auscultation.  HEART:  Regular rate and rhythm, no murmur.  ABDOMEN:  Slightly protuberant. There was a curved transverse incision below the umbilicus which is about 15 cm in length. The periumbilical area is lumpy with incarcerated hernia.  GENITALIA:  Revealed normal penis, scrotum, testes, and medium size bilateral inguinal hernias, both of which are  reducible.  HOSPITAL COURSE:  On the day of admission, the patient was taken to the operating room and underwent repair of all of his hernias. I found that he had a complex recurrent incarcerated ventral hernia containing numerous defects. Incarcerated in the hernia was a large amount of omentum as well as a Meckels diverticulum. We resected part of the omentum and we resected the Meckels diverticulum and repaired the hernia with mesh. He then underwent repair of his bilateral inguinal hernias with Lichtenstein repairs.  Postoperatively, the patient did reasonably well. His Foley catheter was removed on the first postoperative day but he had urinary retention problems and had to have it reinserted for another 48 hours. He had no wound problems. He progressed on his diet and activities slowly but uneventfully. The Foley catheter was removed on February 8 and he had no problems voiding thereafter. He was discharged from the hospital on February 9. At that time, his abdomen was soft and benign. All of the wounds were clean and healing uneventfully. A Jackson-Pratt drain in the ventral hernia wound was still draining about 35 cc for 24 hours. He was given a prescription for Vicodin. We left the drains in and asked him to return to the office in three to four days to remove his staples and the drain. DD:  05/19/00 TD:  05/20/00 Job: [id number]

## 2010-11-01 ENCOUNTER — Encounter: Payer: Self-pay | Admitting: Internal Medicine

## 2010-11-01 DIAGNOSIS — Z0000 Encounter for general adult medical examination without abnormal findings: Secondary | ICD-10-CM | POA: Insufficient documentation

## 2010-11-01 DIAGNOSIS — Z0001 Encounter for general adult medical examination with abnormal findings: Secondary | ICD-10-CM | POA: Insufficient documentation

## 2010-11-02 ENCOUNTER — Other Ambulatory Visit: Payer: Self-pay | Admitting: Internal Medicine

## 2010-11-02 ENCOUNTER — Encounter: Payer: Self-pay | Admitting: Internal Medicine

## 2010-11-02 ENCOUNTER — Ambulatory Visit (INDEPENDENT_AMBULATORY_CARE_PROVIDER_SITE_OTHER): Payer: Self-pay | Admitting: Internal Medicine

## 2010-11-02 ENCOUNTER — Other Ambulatory Visit (INDEPENDENT_AMBULATORY_CARE_PROVIDER_SITE_OTHER): Payer: Self-pay

## 2010-11-02 DIAGNOSIS — I10 Essential (primary) hypertension: Secondary | ICD-10-CM

## 2010-11-02 DIAGNOSIS — Z0000 Encounter for general adult medical examination without abnormal findings: Secondary | ICD-10-CM

## 2010-11-02 DIAGNOSIS — R5383 Other fatigue: Secondary | ICD-10-CM

## 2010-11-02 DIAGNOSIS — E119 Type 2 diabetes mellitus without complications: Secondary | ICD-10-CM

## 2010-11-02 DIAGNOSIS — R5381 Other malaise: Secondary | ICD-10-CM

## 2010-11-02 LAB — CBC WITH DIFFERENTIAL/PLATELET
Basophils Absolute: 0 10*3/uL (ref 0.0–0.1)
Eosinophils Absolute: 0.4 10*3/uL (ref 0.0–0.7)
Eosinophils Relative: 3.5 % (ref 0.0–5.0)
MCV: 94.2 fl (ref 78.0–100.0)
Monocytes Absolute: 1.3 10*3/uL — ABNORMAL HIGH (ref 0.1–1.0)
Neutrophils Relative %: 70.3 % (ref 43.0–77.0)
Platelets: 205 10*3/uL (ref 150.0–400.0)
RDW: 13.2 % (ref 11.5–14.6)
WBC: 10.4 10*3/uL (ref 4.5–10.5)

## 2010-11-02 LAB — BASIC METABOLIC PANEL
CO2: 27 mEq/L (ref 19–32)
Calcium: 10.1 mg/dL (ref 8.4–10.5)
Chloride: 96 mEq/L (ref 96–112)
Sodium: 137 mEq/L (ref 135–145)

## 2010-11-02 LAB — LIPID PANEL
HDL: 31.9 mg/dL — ABNORMAL LOW (ref >39.00)
Triglycerides: 282 mg/dL — ABNORMAL HIGH (ref 0.0–149.0)

## 2010-11-02 LAB — LDL CHOLESTEROL, DIRECT: Direct LDL: 104.4 mg/dL

## 2010-11-02 LAB — TSH: TSH: 1.45 u[IU]/mL (ref 0.35–5.50)

## 2010-11-02 LAB — HEPATIC FUNCTION PANEL
AST: 26 U/L (ref 0–37)
Albumin: 5 g/dL (ref 3.5–5.2)

## 2010-11-02 LAB — PSA: PSA: 1.88 ng/mL (ref 0.10–4.00)

## 2010-11-02 MED ORDER — BENAZEPRIL HCL 40 MG PO TABS: 40.0000 mg | ORAL_TABLET | Freq: Every day | ORAL | Status: DC

## 2010-11-02 MED ORDER — ATENOLOL-CHLORTHALIDONE 100-25 MG PO TABS: 1.0000 | ORAL_TABLET | Freq: Every day | ORAL | Status: DC

## 2010-11-02 MED ORDER — SIMVASTATIN 20 MG PO TABS: 20.0000 mg | ORAL_TABLET | Freq: Every day | ORAL | Status: DC

## 2010-11-02 MED ORDER — AMLODIPINE BESYLATE 10 MG PO TABS: 10.0000 mg | ORAL_TABLET | Freq: Every day | ORAL | Status: DC

## 2010-11-02 MED ORDER — POTASSIUM CHLORIDE 10 MEQ PO TBCR: 10.0000 meq | EXTENDED_RELEASE_TABLET | Freq: Every day | ORAL | Status: DC

## 2010-11-02 NOTE — Patient Instructions (Addendum)
Continue all other medications as before Please go to LAB in the Basement for the blood and/or urine tests to be done today Please call the phone number [phone] (the PhoneTree System) for results of testing in 2-3 days;  When calling, simply dial the number, and when prompted enter the MRN number above (the Medical Record Number) and the # key, then the message should start. Please return in 1 year for your yearly visit, or sooner if needed 

## 2010-11-07 NOTE — Assessment & Plan Note (Signed)
Etiology unclear, Exam otherwise benign, to check labs as documented, follow with expectant management  

## 2010-11-07 NOTE — Assessment & Plan Note (Signed)
stable overall by hx and exam, most recent data reviewed with pt, and pt to continue medical treatment as before  Lab Results  Component Value Date   HGBA1C 5.5 07/21/2007

## 2010-11-07 NOTE — Progress Notes (Signed)
  Subjective:    Patient ID: Joel King, male    DOB: 29-Dec-1950, 60 y.o.   MRN: [id number]  HPI Here to f/u; overall doing ok,  Pt denies chest pain, increased sob or doe, wheezing, orthopnea, PND, increased LE swelling, palpitations, dizziness or syncope.  Pt denies new neurological symptoms such as new headache, or facial or extremity weakness or numbness   Pt denies polydipsia, polyuria,.  Pt states overall good compliance with meds, trying to follow lower cholesterol diet, wt overall stable but little exercise however. Does have sense of ongoing fatigue, but denies signficant hypersomnolence, despite significant wt gain. Currently unemployed, no health insurance, needs refills and labs today Past Medical History  Diagnosis Date  . ANXIETY 11/21/2006  . DEPRESSION 05/31/2008  . DIABETES MELLITUS, TYPE II 07/22/2007  . Diarrhea 05/31/2008  . HYPERLIPIDEMIA 11/21/2006  . HYPERTENSION 11/17/2006  . INSOMNIA-SLEEP DISORDER-UNSPEC 07/21/2007  . OBESITY, MILD 11/17/2006  . PEPTIC ULCER DISEASE, HX OF 11/17/2006  . PEPTIC ULCER DISEASE 07/22/2007  . SINUSITIS- ACUTE-NOS 12/08/2007  . SKIN LESION 12/08/2007   Past Surgical History  Procedure Date  . Inguinal herniorrhapy     bilat  . S/p incarcerated recurrent ventral hernia   . S/p meckel's diverticulum     reports that he has never smoked. He does not have any smokeless tobacco history on file. He reports that he does not drink alcohol. His drug history not on file. family history includes Cancer in his other and Stroke in his other. Allergies  Allergen Reactions  . Atorvastatin   . Pravastatin Sodium   . Rosuvastatin    Current Outpatient Prescriptions on File Prior to Visit  Medication Sig Dispense Refill  . aspirin 81 MG tablet Take 81 mg by mouth daily.         Review of Systems Review of Systems  Constitutional: Negative for diaphoresis and unexpected weight change.  HENT: Negative for drooling and tinnitus.   Eyes: Negative for  photophobia and visual disturbance.  Respiratory: Negative for choking and stridor.   Gastrointestinal: Negative for vomiting and blood in stool.  Genitourinary: Negative for hematuria and decreased urine volume.  Musculoskeletal: Negative for gait problem.  Skin: Negative for color change and wound.  Neurological: Negative for tremors and numbness.  Psychiatric/Behavioral: Negative for decreased concentration. The patient is not hyperactive.       Objective:   Physical Exam BP 122/88  Pulse 64  Temp(Src) 97.7 F (36.5 C) (Oral)  Ht 5\' 8"  (1.727 m)  Wt 254 lb 6 oz (115.384 kg)  BMI 38.68 kg/m2  SpO2 95% Physical Exam  VS noted Constitutional: Pt appears well-developed and well-nourished.  HENT: Head: Normocephalic.  Right Ear: External ear normal.  Left Ear: External ear normal.  Eyes: Conjunctivae and EOM are normal. Pupils are equal, round, and reactive to light.  Neck: Normal range of motion. Neck supple.  Cardiovascular: Normal rate and regular rhythm.   Pulmonary/Chest: Effort normal and breath sounds normal.  Abd:  Soft, NT, non-distended, + BS Neurological: Pt is alert. No cranial nerve deficit.  Skin: Skin is warm. No erythema.  Psychiatric: Pt behavior is normal. Thought content normal. depressed mood, 1+ nervous       Assessment & Plan:

## 2010-11-07 NOTE — Assessment & Plan Note (Signed)
stable overall by hx and exam, most recent data reviewed with pt, and pt to continue medical treatment as before  BP Readings from Last 3 Encounters:  11/02/10 122/88  11/03/09 128/80  07/29/08 126/68

## 2011-09-20 ENCOUNTER — Other Ambulatory Visit: Payer: Self-pay | Admitting: Internal Medicine

## 2012-01-12 ENCOUNTER — Telehealth: Payer: Self-pay | Admitting: Internal Medicine

## 2012-01-12 MED ORDER — ATENOLOL-CHLORTHALIDONE 100-25 MG PO TABS: 1.0000 | ORAL_TABLET | Freq: Every day | ORAL | Status: DC

## 2012-01-12 MED ORDER — SIMVASTATIN 20 MG PO TABS: 20.0000 mg | ORAL_TABLET | Freq: Every day | ORAL | Status: DC

## 2012-01-12 MED ORDER — AMLODIPINE BESYLATE 10 MG PO TABS: 10.0000 mg | ORAL_TABLET | Freq: Every day | ORAL | Status: DC

## 2012-01-12 MED ORDER — BENAZEPRIL HCL 40 MG PO TABS: 40.0000 mg | ORAL_TABLET | Freq: Every day | ORAL | Status: DC

## 2012-01-12 MED ORDER — POTASSIUM CHLORIDE ER 10 MEQ PO TBCR: 10.0000 meq | EXTENDED_RELEASE_TABLET | Freq: Every day | ORAL | Status: DC

## 2012-01-12 NOTE — Telephone Encounter (Signed)
The patient does not have insurance and is in need of getting his medicine renewed.  He states he is getting insurance in January and plans on making a follow up apt then.  His callback number is [phone]

## 2012-01-12 NOTE — Telephone Encounter (Signed)
Called the patient left detailed message of MD instructions. 

## 2012-01-12 NOTE — Telephone Encounter (Signed)
meds refilled through jan - should make ROV in jan 2014

## 2012-02-28 ENCOUNTER — Ambulatory Visit (INDEPENDENT_AMBULATORY_CARE_PROVIDER_SITE_OTHER): Payer: Self-pay | Admitting: Internal Medicine

## 2012-02-28 ENCOUNTER — Encounter: Payer: Self-pay | Admitting: Internal Medicine

## 2012-02-28 VITALS — BP 130/78 | HR 63 | Temp 97.0°F | Ht 68.0 in | Wt 258.0 lb

## 2012-02-28 DIAGNOSIS — R062 Wheezing: Secondary | ICD-10-CM

## 2012-02-28 DIAGNOSIS — Z0000 Encounter for general adult medical examination without abnormal findings: Secondary | ICD-10-CM

## 2012-02-28 DIAGNOSIS — E119 Type 2 diabetes mellitus without complications: Secondary | ICD-10-CM

## 2012-02-28 DIAGNOSIS — J40 Bronchitis, not specified as acute or chronic: Secondary | ICD-10-CM | POA: Insufficient documentation

## 2012-02-28 DIAGNOSIS — J209 Acute bronchitis, unspecified: Secondary | ICD-10-CM

## 2012-02-28 DIAGNOSIS — I10 Essential (primary) hypertension: Secondary | ICD-10-CM

## 2012-02-28 MED ORDER — BENZONATATE 100 MG PO CAPS: ORAL_CAPSULE | ORAL | Status: DC

## 2012-02-28 MED ORDER — CEPHALEXIN 500 MG PO CAPS: 500.0000 mg | ORAL_CAPSULE | Freq: Four times a day (QID) | ORAL | Status: DC

## 2012-02-28 MED ORDER — PREDNISONE 10 MG PO TABS: ORAL_TABLET | ORAL | Status: DC

## 2012-02-28 NOTE — Progress Notes (Signed)
  Subjective:    Patient ID: Joel King, male    DOB: 12-24-1950, 61 y.o.   MRN: [id number]  HPI Here with acute onset mild to mod 2-3 days ST, HA, general weakness and malaise, with prod cough greenish sputum, but Pt denies chest pain, increased sob or doe, wheezing, orthopnea, PND, increased LE swelling, palpitations, dizziness or syncope except for midl wheezing onset last night. Past Medical History  Diagnosis Date  . ANXIETY 11/21/2006  . DEPRESSION 05/31/2008  . DIABETES MELLITUS, TYPE II 07/22/2007  . Diarrhea 05/31/2008  . HYPERLIPIDEMIA 11/21/2006  . HYPERTENSION 11/17/2006  . INSOMNIA-SLEEP DISORDER-UNSPEC 07/21/2007  . OBESITY, MILD 11/17/2006  . PEPTIC ULCER DISEASE, HX OF 11/17/2006  . PEPTIC ULCER DISEASE 07/22/2007  . SINUSITIS- ACUTE-NOS 12/08/2007  . SKIN LESION 12/08/2007   Past Surgical History  Procedure Date  . Inguinal herniorrhapy     bilat  . S/p incarcerated recurrent ventral hernia   . S/p meckel's diverticulum     reports that he has never smoked. He does not have any smokeless tobacco history on file. He reports that he does not drink alcohol. His drug history not on file. family history includes Cancer in his other and Stroke in his other. Allergies  Allergen Reactions  . Atorvastatin   . Pravastatin Sodium   . Rosuvastatin    Current Outpatient Prescriptions on File Prior to Visit  Medication Sig Dispense Refill  . amLODipine (NORVASC) 10 MG tablet Take 1 tablet (10 mg total) by mouth daily.  90 tablet  0  . aspirin 81 MG tablet Take 81 mg by mouth daily.        Marland Kitchen atenolol-chlorthalidone (TENORETIC) 100-25 MG per tablet Take 1 tablet by mouth daily.  90 tablet  0  . benazepril (LOTENSIN) 40 MG tablet Take 1 tablet (40 mg total) by mouth daily.  90 tablet  0  . potassium chloride (KLOR-CON 10) 10 MEQ tablet Take 1 tablet (10 mEq total) by mouth daily.  90 tablet  0  . simvastatin (ZOCOR) 20 MG tablet Take 1 tablet (20 mg total) by mouth daily.  90 tablet   0   Review of Systems All otherwise neg per pt     Objective:   Physical Exam BP 130/78  Pulse 63  Temp 97 F (36.1 C) (Oral)  Ht 5\' 8"  (1.727 m)  Wt 258 lb (117.028 kg)  BMI 39.23 kg/m2  SpO2 92% Physical Exam  VS noted,mild ill  Constitutional: Pt appears well-developed and well-nourished.  HENT: Head: Normocephalic.  Right Ear: External ear normal.  Left Ear: External ear normal.  Bilat tm's mild erythema.  Sinus nontender.  Pharynx mild erythema Eyes: Conjunctivae and EOM are normal. Pupils are equal, round, and reactive to light.  Neck: Normal range of motion. Neck supple.  Cardiovascular: Normal rate and regular rhythm.   Pulmonary/Chest: Effort normal and breath sounds with midl wheeze bilat.  Neurological: Pt is alert. Not confused  Skin: Skin is warm. No erythema.  Psychiatric: Pt behavior is normal. Thought content normal.     Assessment & Plan:

## 2012-02-28 NOTE — Patient Instructions (Addendum)
Take all new medications as prescribed Continue all other medications as before Please continue your efforts at being more active, low cholesterol diet, and weight control. Please return in 6 weeks with Lab testing done 3-5 days before

## 2012-02-28 NOTE — Assessment & Plan Note (Signed)
Mild to mod, for antibx course,  to f/u any worsening symptoms or concerns 

## 2012-02-28 NOTE — Assessment & Plan Note (Signed)
stable overall by hx and exam, most recent data reviewed with pt, and pt to continue medical treatment as before BP Readings from Last 3 Encounters:  02/28/12 130/78  11/02/10 122/88  11/03/09 128/80

## 2012-02-28 NOTE — Assessment & Plan Note (Signed)
stable overall by hx and exam, most recent data reviewed with pt, and pt to continue medical treatment as before  Lab Results  Component Value Date   HGBA1C 5.5 07/21/2007    

## 2012-02-28 NOTE — Assessment & Plan Note (Signed)
Mild to mod, for predpack  course,  to f/u any worsening symptoms or concerns 

## 2012-04-19 ENCOUNTER — Other Ambulatory Visit: Payer: Self-pay | Admitting: Internal Medicine

## 2012-10-02 ENCOUNTER — Telehealth: Payer: Self-pay

## 2012-10-02 DIAGNOSIS — Z0000 Encounter for general adult medical examination without abnormal findings: Secondary | ICD-10-CM

## 2012-10-02 DIAGNOSIS — E119 Type 2 diabetes mellitus without complications: Secondary | ICD-10-CM

## 2012-10-02 NOTE — Telephone Encounter (Signed)
Labs entered and patient aware 

## 2012-10-02 NOTE — Telephone Encounter (Signed)
Labs entered.

## 2012-10-03 ENCOUNTER — Other Ambulatory Visit (INDEPENDENT_AMBULATORY_CARE_PROVIDER_SITE_OTHER): Payer: Self-pay

## 2012-10-03 DIAGNOSIS — E119 Type 2 diabetes mellitus without complications: Secondary | ICD-10-CM

## 2012-10-03 DIAGNOSIS — Z0000 Encounter for general adult medical examination without abnormal findings: Secondary | ICD-10-CM

## 2012-10-03 LAB — CBC WITH DIFFERENTIAL/PLATELET
Basophils Absolute: 0 10*3/uL (ref 0.0–0.1)
Eosinophils Relative: 2.5 % (ref 0.0–5.0)
Lymphocytes Relative: 17.3 % (ref 12.0–46.0)
Monocytes Relative: 10 % (ref 3.0–12.0)
Neutrophils Relative %: 69.8 % (ref 43.0–77.0)
Platelets: 198 10*3/uL (ref 150.0–400.0)
RDW: 13.3 % (ref 11.5–14.6)
WBC: 9.3 10*3/uL (ref 4.5–10.5)

## 2012-10-03 LAB — URINALYSIS, ROUTINE W REFLEX MICROSCOPIC
Hgb urine dipstick: NEGATIVE
Leukocytes, UA: NEGATIVE
Nitrite: NEGATIVE
RBC / HPF: NONE SEEN (ref 0–?)
Specific Gravity, Urine: 1.01 (ref 1.000–1.030)
Urine Glucose: NEGATIVE
Urobilinogen, UA: 0.2 (ref 0.0–1.0)

## 2012-10-03 LAB — MICROALBUMIN / CREATININE URINE RATIO
Creatinine,U: 71.5 mg/dL
Microalb Creat Ratio: 13.7 mg/g (ref 0.0–30.0)

## 2012-10-03 LAB — TSH: TSH: 2.09 u[IU]/mL (ref 0.35–5.50)

## 2012-10-03 LAB — BASIC METABOLIC PANEL
CO2: 31 mEq/L (ref 19–32)
Calcium: 9.9 mg/dL (ref 8.4–10.5)
Chloride: 100 mEq/L (ref 96–112)
Potassium: 3.5 mEq/L (ref 3.5–5.1)
Sodium: 138 mEq/L (ref 135–145)

## 2012-10-03 LAB — LIPID PANEL: Total CHOL/HDL Ratio: 6

## 2012-10-03 LAB — PSA: PSA: 2.23 ng/mL (ref 0.10–4.00)

## 2012-10-03 LAB — HEMOGLOBIN A1C: Hgb A1c MFr Bld: 5.8 % (ref 4.6–6.5)

## 2012-10-03 LAB — HEPATIC FUNCTION PANEL
Albumin: 4.3 g/dL (ref 3.5–5.2)
Alkaline Phosphatase: 57 U/L (ref 39–117)

## 2012-10-10 ENCOUNTER — Encounter: Payer: Self-pay | Admitting: Internal Medicine

## 2012-10-10 ENCOUNTER — Ambulatory Visit (INDEPENDENT_AMBULATORY_CARE_PROVIDER_SITE_OTHER): Payer: BC Managed Care – PPO | Admitting: Internal Medicine

## 2012-10-10 VITALS — BP 142/80 | HR 58 | Temp 98.3°F | Ht 68.0 in | Wt 258.0 lb

## 2012-10-10 DIAGNOSIS — Z0000 Encounter for general adult medical examination without abnormal findings: Secondary | ICD-10-CM

## 2012-10-10 DIAGNOSIS — E119 Type 2 diabetes mellitus without complications: Secondary | ICD-10-CM

## 2012-10-10 DIAGNOSIS — R972 Elevated prostate specific antigen [PSA]: Secondary | ICD-10-CM

## 2012-10-10 MED ORDER — SIMVASTATIN 20 MG PO TABS: ORAL_TABLET | ORAL | Status: DC

## 2012-10-10 MED ORDER — ATENOLOL-CHLORTHALIDONE 100-25 MG PO TABS: ORAL_TABLET | ORAL | Status: DC

## 2012-10-10 MED ORDER — AMLODIPINE BESYLATE 10 MG PO TABS: ORAL_TABLET | ORAL | Status: DC

## 2012-10-10 MED ORDER — POTASSIUM CHLORIDE CRYS ER 10 MEQ PO TBCR: EXTENDED_RELEASE_TABLET | ORAL | Status: DC

## 2012-10-10 MED ORDER — BENAZEPRIL HCL 40 MG PO TABS: ORAL_TABLET | ORAL | Status: DC

## 2012-10-10 NOTE — Progress Notes (Signed)
Subjective:    Patient ID: Joel King, male    DOB: [birth year], 62 y.o.   MRN: [id number]  HPI  Here for wellness and f/u;  Overall doing ok;  Pt denies CP, worsening SOB, DOE, wheezing, orthopnea, PND, worsening LE edema, palpitations, dizziness or syncope.  Pt denies neurological change such as new headache, facial or extremity weakness.  Pt denies polydipsia, polyuria, or low sugar symptoms. Pt states overall good compliance with treatment and medications, good tolerability, and has been trying to follow lower cholesterol diet.  Pt denies worsening depressive symptoms, suicidal ideation or panic. No fever, night sweats, wt loss, loss of appetite, or other constitutional symptoms.  Pt states good ability with ADL's, has low fall risk, home safety reviewed and adequate, no other significant changes in hearing or vision, and only occasionally active with exercise.  Still declines colonsocopy.\ Past Medical History  Diagnosis Date  . ANXIETY 11/21/2006  . DEPRESSION 05/31/2008  . DIABETES MELLITUS, TYPE II 07/22/2007  . Diarrhea 05/31/2008  . HYPERLIPIDEMIA 11/21/2006  . HYPERTENSION 11/17/2006  . INSOMNIA-SLEEP DISORDER-UNSPEC 07/21/2007  . OBESITY, MILD 11/17/2006  . PEPTIC ULCER DISEASE, HX OF 11/17/2006  . PEPTIC ULCER DISEASE 07/22/2007  . SINUSITIS- ACUTE-NOS 12/08/2007  . SKIN LESION 12/08/2007   Past Surgical History  Procedure Laterality Date  . Inguinal herniorrhapy      bilat  . S/p incarcerated recurrent ventral hernia    . S/p meckel's diverticulum      reports that he has never smoked. He does not have any smokeless tobacco history on file. He reports that he does not drink alcohol. His drug history is not on file. family history includes Cancer in his other and Stroke in his other. Allergies  Allergen Reactions  . Atorvastatin   . Pravastatin Sodium   . Rosuvastatin    Current Outpatient Prescriptions on File Prior to Visit  Medication Sig Dispense Refill  . amLODipine  (NORVASC) 10 MG tablet take 1 tablet by mouth once daily  90 tablet  1  . aspirin 81 MG tablet Take 81 mg by mouth daily.        Marland Kitchen atenolol-chlorthalidone (TENORETIC) 100-25 MG per tablet take 1 tablet by mouth once daily  90 tablet  1  . benazepril (LOTENSIN) 40 MG tablet take 1 tablet by mouth once daily  90 tablet  1  . KLOR-CON M10 10 MEQ tablet take 1 tablet by mouth once daily  90 each  1  . simvastatin (ZOCOR) 20 MG tablet take 1 tablet by mouth once daily  90 tablet  1   No current facility-administered medications on file prior to visit.   Review of Systems Constitutional: Negative for diaphoresis, activity change, appetite change or unexpected weight change.  HENT: Negative for hearing loss, ear pain, facial swelling, mouth sores and neck stiffness.   Eyes: Negative for pain, redness and visual disturbance.  Respiratory: Negative for shortness of breath and wheezing.   Cardiovascular: Negative for chest pain and palpitations.  Gastrointestinal: Negative for diarrhea, blood in stool, abdominal distention or other pain Genitourinary: Negative for hematuria, flank pain or change in urine volume.  Musculoskeletal: Negative for myalgias and joint swelling.  Skin: Negative for color change and wound.  Neurological: Negative for syncope and numbness. other than noted Hematological: Negative for adenopathy.  Psychiatric/Behavioral: Negative for hallucinations, self-injury, decreased concentration and agitation.      Objective:   Physical Exam BP 142/80  Pulse 58  Temp(Src) 98.3 F (36.8 C) (  Oral)  Ht 5\' 8"  (1.727 m)  Wt 258 lb (117.028 kg)  BMI 39.24 kg/m2  SpO2 97% VS noted,  Constitutional: Pt is oriented to person, place, and time. Appears well-developed and well-nourished./obese  Head: Normocephalic and atraumatic.  Right Ear: External ear normal.  Left Ear: External ear normal.  Nose: Nose normal.  Mouth/Throat: Oropharynx is clear and moist.  Eyes: Conjunctivae and  EOM are normal. Pupils are equal, round, and reactive to light.  Neck: Normal range of motion. Neck supple. No JVD present. No tracheal deviation present.  Cardiovascular: Normal rate, regular rhythm, normal heart sounds and intact distal pulses.   Pulmonary/Chest: Effort normal and breath sounds normal.  Abdominal: Soft. Bowel sounds are normal. There is no tenderness. No HSM  Musculoskeletal: Normal range of motion. Exhibits no edema.  Lymphadenopathy:  Has no cervical adenopathy.  Neurological: Pt is alert and oriented to person, place, and time. Pt has normal reflexes. No cranial nerve deficit.  Skin: Skin is warm and dry. No rash noted.  Psychiatric:  Has  normal mood and affect. Behavior is normal.     Assessment & Plan:

## 2012-10-10 NOTE — Assessment & Plan Note (Addendum)

## 2012-10-10 NOTE — Patient Instructions (Signed)
Your EKG was OK today You are given the copy of the lab work today Please continue all other medications as before, and refills have been done if requested. Please have the pharmacy call with any other refills you may need. Please continue your efforts at being more active, low cholesterol diet, and weight control  Please remember to sign up for My Chart if you have not done so, as this will be important to you in the future with finding out test results, communicating by private email, and scheduling acute appointments online when needed.  Please return in 6 months, or sooner if needed, with Lab testing done 3-5 days before

## 2012-10-10 NOTE — Assessment & Plan Note (Signed)
stable overall by history and exam, recent data reviewed with pt, and pt to continue medical treatment as before,  to f/u any worsening symptoms or concerns Lab Results  Component Value Date   HGBA1C 5.8 10/03/2012

## 2013-01-30 DIAGNOSIS — M722 Plantar fascial fibromatosis: Secondary | ICD-10-CM

## 2013-06-25 DIAGNOSIS — M79609 Pain in unspecified limb: Secondary | ICD-10-CM

## 2013-10-01 ENCOUNTER — Telehealth: Payer: Self-pay | Admitting: *Deleted

## 2013-10-01 DIAGNOSIS — Z0000 Encounter for general adult medical examination without abnormal findings: Secondary | ICD-10-CM

## 2013-10-01 NOTE — Telephone Encounter (Signed)
Patient would like to come to the lab before his appointment to have his physical done.  Please place lab orders if okay.

## 2013-10-02 NOTE — Telephone Encounter (Signed)
Patient is aware 

## 2013-10-02 NOTE — Telephone Encounter (Signed)
Labs ordered.

## 2013-10-24 ENCOUNTER — Other Ambulatory Visit (INDEPENDENT_AMBULATORY_CARE_PROVIDER_SITE_OTHER): Payer: No Typology Code available for payment source

## 2013-10-24 DIAGNOSIS — Z0000 Encounter for general adult medical examination without abnormal findings: Secondary | ICD-10-CM

## 2013-10-24 LAB — CBC WITH DIFFERENTIAL/PLATELET
Basophils Absolute: 0 10*3/uL (ref 0.0–0.1)
Basophils Relative: 0.4 % (ref 0.0–3.0)
EOS PCT: 4.2 % (ref 0.0–5.0)
Eosinophils Absolute: 0.4 10*3/uL (ref 0.0–0.7)
HEMATOCRIT: 47.9 % (ref 39.0–52.0)
HEMOGLOBIN: 16.5 g/dL (ref 13.0–17.0)
LYMPHS ABS: 1.6 10*3/uL (ref 0.7–4.0)
Lymphocytes Relative: 18.2 % (ref 12.0–46.0)
MCHC: 34.4 g/dL (ref 30.0–36.0)
MCV: 92.4 fl (ref 78.0–100.0)
MONO ABS: 1 10*3/uL (ref 0.1–1.0)
Monocytes Relative: 11.4 % (ref 3.0–12.0)
NEUTROS ABS: 5.9 10*3/uL (ref 1.4–7.7)
Neutrophils Relative %: 65.8 % (ref 43.0–77.0)
Platelets: 178 10*3/uL (ref 150.0–400.0)
RBC: 5.19 Mil/uL (ref 4.22–5.81)
RDW: 13 % (ref 11.5–15.5)
WBC: 8.9 10*3/uL (ref 4.0–10.5)

## 2013-10-24 LAB — BASIC METABOLIC PANEL
BUN: 16 mg/dL (ref 6–23)
CO2: 27 meq/L (ref 19–32)
CREATININE: 0.9 mg/dL (ref 0.4–1.5)
Calcium: 9.3 mg/dL (ref 8.4–10.5)
Chloride: 102 mEq/L (ref 96–112)
GFR: 86.05 mL/min (ref >60.00)
Glucose, Bld: 111 mg/dL — ABNORMAL HIGH (ref 70–99)
Potassium: 3.3 mEq/L — ABNORMAL LOW (ref 3.5–5.1)
SODIUM: 139 meq/L (ref 135–145)

## 2013-10-24 LAB — LIPID PANEL
Cholesterol: 154 mg/dL (ref 0–200)
HDL: 29 mg/dL — ABNORMAL LOW (ref >39.00)
LDL Cholesterol: 93 mg/dL (ref 0–99)
NONHDL: 125
Total CHOL/HDL Ratio: 5
Triglycerides: 159 mg/dL — ABNORMAL HIGH (ref 0.0–149.0)
VLDL: 31.8 mg/dL (ref 0.0–40.0)

## 2013-10-24 LAB — URINALYSIS, ROUTINE W REFLEX MICROSCOPIC
BILIRUBIN URINE: NEGATIVE
Hgb urine dipstick: NEGATIVE
Ketones, ur: NEGATIVE
Leukocytes, UA: NEGATIVE
NITRITE: NEGATIVE
PH: 6.5 (ref 5.0–8.0)
Specific Gravity, Urine: 1.01 (ref 1.000–1.030)
TOTAL PROTEIN, URINE-UPE24: NEGATIVE
URINE GLUCOSE: NEGATIVE
Urobilinogen, UA: 0.2 (ref 0.0–1.0)

## 2013-10-24 LAB — HEPATIC FUNCTION PANEL
ALK PHOS: 62 U/L (ref 39–117)
ALT: 25 U/L (ref 0–53)
AST: 23 U/L (ref 0–37)
Albumin: 4.1 g/dL (ref 3.5–5.2)
BILIRUBIN DIRECT: 0.1 mg/dL (ref 0.0–0.3)
TOTAL PROTEIN: 6.9 g/dL (ref 6.0–8.3)
Total Bilirubin: 0.7 mg/dL (ref 0.2–1.2)

## 2013-10-24 LAB — PSA: PSA: 2.11 ng/mL (ref 0.10–4.00)

## 2013-10-24 LAB — TSH: TSH: 1.78 u[IU]/mL (ref 0.35–4.50)

## 2013-11-02 ENCOUNTER — Ambulatory Visit (INDEPENDENT_AMBULATORY_CARE_PROVIDER_SITE_OTHER): Payer: No Typology Code available for payment source | Admitting: Internal Medicine

## 2013-11-02 ENCOUNTER — Encounter: Payer: Self-pay | Admitting: Internal Medicine

## 2013-11-02 VITALS — BP 132/78 | HR 59 | Temp 97.9°F | Wt 257.5 lb

## 2013-11-02 DIAGNOSIS — I10 Essential (primary) hypertension: Secondary | ICD-10-CM

## 2013-11-02 DIAGNOSIS — Z0000 Encounter for general adult medical examination without abnormal findings: Secondary | ICD-10-CM

## 2013-11-02 DIAGNOSIS — E119 Type 2 diabetes mellitus without complications: Secondary | ICD-10-CM

## 2013-11-02 MED ORDER — ATENOLOL-CHLORTHALIDONE 100-25 MG PO TABS: ORAL_TABLET | ORAL | Status: DC

## 2013-11-02 MED ORDER — BENAZEPRIL HCL 40 MG PO TABS: ORAL_TABLET | ORAL | Status: DC

## 2013-11-02 MED ORDER — AMLODIPINE BESYLATE 10 MG PO TABS: ORAL_TABLET | ORAL | Status: DC

## 2013-11-02 MED ORDER — SIMVASTATIN 20 MG PO TABS: ORAL_TABLET | ORAL | Status: DC

## 2013-11-02 MED ORDER — POTASSIUM CHLORIDE CRYS ER 10 MEQ PO TBCR: EXTENDED_RELEASE_TABLET | ORAL | Status: DC

## 2013-11-02 NOTE — Assessment & Plan Note (Addendum)
Diet controlled, for wt loss, better diet,  to f/u any worsening symptoms or concerns Lab Results  Component Value Date   HGBA1C 5.8 10/03/2012

## 2013-11-02 NOTE — Progress Notes (Signed)
Pre visit review using our clinic review tool, if applicable. No additional management support is needed unless otherwise documented below in the visit note. 

## 2013-11-02 NOTE — Progress Notes (Signed)
Subjective:    Patient ID: Joel King, male    DOB: [birth year], 63 y.o.   MRN: [id number]  HPI  Here for wellness and f/u;  Overall doing ok;  Pt denies CP, worsening SOB, DOE, wheezing, orthopnea, PND, worsening LE edema, palpitations, dizziness or syncope.  Pt denies neurological change such as new headache, facial or extremity weakness.  Pt denies polydipsia, polyuria, or low sugar symptoms. Pt states overall good compliance with treatment and medications, good tolerability, and has been trying to follow lower cholesterol diet.  Pt denies worsening depressive symptoms, suicidal ideation or panic. No fever, night sweats, wt loss, loss of appetite, or other constitutional symptoms.  Pt states good ability with ADL's, has low fall risk, home safety reviewed and adequate, no other significant changes in hearing or vision, and only rarely if at all active with exercise.  No current complaints Past Medical History  Diagnosis Date  . ANXIETY 11/21/2006  . DEPRESSION 05/31/2008  . DIABETES MELLITUS, TYPE II 07/22/2007  . Diarrhea 05/31/2008  . HYPERLIPIDEMIA 11/21/2006  . HYPERTENSION 11/17/2006  . INSOMNIA-SLEEP DISORDER-UNSPEC 07/21/2007  . OBESITY, MILD 11/17/2006  . PEPTIC ULCER DISEASE, HX OF 11/17/2006  . PEPTIC ULCER DISEASE 07/22/2007  . SINUSITIS- ACUTE-NOS 12/08/2007  . SKIN LESION 12/08/2007   Past Surgical History  Procedure Laterality Date  . Inguinal herniorrhapy      bilat  . S/p incarcerated recurrent ventral hernia    . S/p meckel's diverticulum      reports that he has never smoked. He does not have any smokeless tobacco history on file. He reports that he does not drink alcohol. His drug history is not on file. family history includes Cancer in his other; Stroke in his other. Allergies  Allergen Reactions  . Atorvastatin   . Pravastatin Sodium   . Rosuvastatin    Current Outpatient Prescriptions on File Prior to Visit  Medication Sig Dispense Refill  . aspirin 81 MG  tablet Take 81 mg by mouth daily.         No current facility-administered medications on file prior to visit.   Review of Systems Constitutional: Negative for increased diaphoresis, other activity, appetite or other siginficant weight change  HENT: Negative for worsening hearing loss, ear pain, facial swelling, mouth sores and neck stiffness.   Eyes: Negative for other worsening pain, redness or visual disturbance.  Respiratory: Negative for shortness of breath and wheezing.   Cardiovascular: Negative for chest pain and palpitations.  Gastrointestinal: Negative for diarrhea, blood in stool, abdominal distention or other pain Genitourinary: Negative for hematuria, flank pain or change in urine volume.  Musculoskeletal: Negative for myalgias or other joint complaints.  Skin: Negative for color change and wound.  Neurological: Negative for syncope and numbness. other than noted Hematological: Negative for adenopathy. or other swelling Psychiatric/Behavioral: Negative for hallucinations, self-injury, decreased concentration or other worsening agitation.      Objective:   Physical Exam BP 132/78  Pulse 59  Temp(Src) 97.9 F (36.6 C) (Oral)  Wt 257 lb 8 oz (116.801 kg)  SpO2 96% VS noted,  Constitutional: Pt is oriented to person, place, and time. Appears well-developed and well-nourished. Annabell Sabal Head: Normocephalic and atraumatic.  Right Ear: External ear normal.  Left Ear: External ear normal.  Nose: Nose normal.  Mouth/Throat: Oropharynx is clear and moist.  Eyes: Conjunctivae and EOM are normal. Pupils are equal, round, and reactive to light.  Neck: Normal range of motion. Neck supple. No JVD present. No tracheal  deviation present.  Cardiovascular: Normal rate, regular rhythm, normal heart sounds and intact distal pulses.   Pulmonary/Chest: Effort normal and breath sounds without rales or wheezing  Abdominal: Soft. Bowel sounds are normal. NT. No HSM  Musculoskeletal: Normal  range of motion. Exhibits no edema.  Lymphadenopathy:  Has no cervical adenopathy.  Neurological: Pt is alert and oriented to person, place, and time. Pt has normal reflexes. No cranial nerve deficit. Motor grossly intact Skin: Skin is warm and dry. No rash noted.  Psychiatric:  Has mild nervous mood and affect. Behavior is normal.     Assessment & Plan:

## 2013-11-02 NOTE — Patient Instructions (Addendum)
Please continue all other medications as before, and refills have been done if requested.  Please have the pharmacy call with any other refills you may need.  Please continue your efforts at being more active, low cholesterol diet, and weight control.  You are otherwise up to date with prevention measures today.  Please keep your appointments with your specialists as you may have planned  Please call if you change your mind about the colonoscopy  Please return in 1 year for your yearly visit, or sooner if needed, with Lab testing done 3-5 days before

## 2013-11-02 NOTE — Assessment & Plan Note (Signed)
stable overall by history and exam, recent data reviewed with pt, and pt to continue medical treatment as before,  to f/u any worsening symptoms or concerns BP Readings from Last 3 Encounters:  11/02/13 132/78  10/10/12 142/80  02/28/12 130/78

## 2013-11-02 NOTE — Assessment & Plan Note (Signed)

## 2013-11-05 ENCOUNTER — Telehealth: Payer: Self-pay | Admitting: Internal Medicine

## 2013-11-05 NOTE — Telephone Encounter (Signed)
Patient has a question in regards to his lab work that Dr. Jenny Reichmann reviewed with him on Friday. Please advise.

## 2013-11-05 NOTE — Telephone Encounter (Signed)
Called pt no answer LMOM RTC.../lmb 

## 2013-11-05 NOTE — Telephone Encounter (Signed)
Relevant patient education mailed to patient.  

## 2013-11-05 NOTE — Telephone Encounter (Signed)
Pt return call back he wanted to know did md check his a1c, inform pt md didn't he per his chart md is going to check next time order is already entered...Johny Chess

## 2013-12-22 ENCOUNTER — Encounter: Payer: Self-pay | Admitting: Family Medicine

## 2013-12-22 ENCOUNTER — Ambulatory Visit (INDEPENDENT_AMBULATORY_CARE_PROVIDER_SITE_OTHER): Payer: No Typology Code available for payment source | Admitting: Family Medicine

## 2013-12-22 ENCOUNTER — Other Ambulatory Visit: Payer: Self-pay

## 2013-12-22 VITALS — BP 140/72 | HR 60 | Temp 98.0°F | Wt 244.0 lb

## 2013-12-22 DIAGNOSIS — M25561 Pain in right knee: Secondary | ICD-10-CM | POA: Insufficient documentation

## 2013-12-22 DIAGNOSIS — M25569 Pain in unspecified knee: Secondary | ICD-10-CM

## 2013-12-22 MED ORDER — NAPROXEN 500 MG PO TABS: ORAL_TABLET | ORAL | Status: DC

## 2013-12-22 NOTE — Assessment & Plan Note (Signed)
Exam overall benign - anticipate knee strain or early arthritis flare. Treat supportively with NSAID (discussed take with food, discussed GI precautions, discussed don't take with other NSAIDs, tylenol ok), elevation and rest, ice/heating pad, discussed knee sleeve use. Update if not improving as expected. Would consider xray as next step. Pt agrees with plan.

## 2013-12-22 NOTE — Progress Notes (Signed)
   BP 140/72  Pulse 60  Temp(Src) 98 F (36.7 C)  Wt 244 lb (110.678 kg)   CC: knee pain  Subjective:    Patient ID: Joel King, male    DOB: Sep 24, 1950, 63 y.o.   MRN: [id number]  HPI: Joel King is a 63 y.o. male presenting on 12/22/2013 for Knee Pain   R knee pain ongoing for last 2-3 days. Has had pain prior to this but intermittent and quickly resolved on its own. Has been treating with bengay which helps temporarily. Improved with laying supine. Some radiation of pain up lateral leg. May have strained knee after cleaning bath tub. No locking of knee. No knee instability. Has tried tylenol and ibuprofen 600mg  which helps but temporarily. Denies swelling or redness or warmth of knee.   No h/o gout. No h/o knee problems. Denies inciting trauma or injury to knee.   Past Medical History  Diagnosis Date  . ANXIETY 11/21/2006  . DEPRESSION 05/31/2008  . DIABETES MELLITUS, TYPE II 07/22/2007  . Diarrhea 05/31/2008  . HYPERLIPIDEMIA 11/21/2006  . HYPERTENSION 11/17/2006  . INSOMNIA-SLEEP DISORDER-UNSPEC 07/21/2007  . OBESITY, MILD 11/17/2006  . PEPTIC ULCER DISEASE, HX OF 11/17/2006  . PEPTIC ULCER DISEASE 07/22/2007  . SINUSITIS- ACUTE-NOS 12/08/2007  . SKIN LESION 12/08/2007     Relevant past medical, surgical, family and social history reviewed and updated as indicated.  Allergies and medications reviewed and updated. Current Outpatient Prescriptions on File Prior to Visit  Medication Sig  . amLODipine (NORVASC) 10 MG tablet take 1 tablet by mouth once daily  . aspirin 81 MG tablet Take 81 mg by mouth daily.    Marland Kitchen atenolol-chlorthalidone (TENORETIC) 100-25 MG per tablet take 1 tablet by mouth once daily  . benazepril (LOTENSIN) 40 MG tablet take 1 tablet by mouth once daily  . potassium chloride (KLOR-CON M10) 10 MEQ tablet take 1 tablet by mouth once daily  . simvastatin (ZOCOR) 20 MG tablet take 1 tablet by mouth once daily   No current facility-administered  medications on file prior to visit.    Review of Systems Per HPI unless specifically indicated above    Objective:    BP 140/72  Pulse 60  Temp(Src) 98 F (36.7 C)  Wt 244 lb (110.678 kg)  Physical Exam  Nursing note and vitals reviewed. Constitutional: He appears well-developed and well-nourished. No distress.  Musculoskeletal: He exhibits no edema.  L knee WNL R Knee exam: No deformity on inspection. No pain with palpation of knee landmarks. Mild swelling noted. FROM in flex/extension with some crepitus. No popliteal fullness. Neg drawer test. Neg mcmurray test. No pain with valgus/varus stress. No PFgrind. No abnormal patellar mobility.       Assessment & Plan:   Problem List Items Addressed This Visit   Right knee pain - Primary     Exam overall benign - anticipate knee strain or early arthritis flare. Treat supportively with NSAID (discussed take with food, discussed GI precautions, discussed don't take with other NSAIDs, tylenol ok), elevation and rest, ice/heating pad, discussed knee sleeve use. Update if not improving as expected. Would consider xray as next step. Pt agrees with plan.        Follow up plan: Return if symptoms worsen or fail to improve.

## 2013-12-22 NOTE — Patient Instructions (Signed)
I think you have a knee strain or early arthritis. Treat with elevation of leg, stop ibuprofen and take naprosyn twice daily with food for 5 days then as needed. Continue ice or heating pad (whichever soothes knee better) may continue bengay. If not improving may buy a sleeve brace and if not better with this, return for further evaluation - and possible xray.  Knee Pain The knee is the complex joint between your thigh and your lower leg. It is made up of bones, tendons, ligaments, and cartilage. The bones that make up the knee are:  The femur in the thigh.  The tibia and fibula in the lower leg.  The patella or kneecap riding in the groove on the lower femur. CAUSES  Knee pain is a common complaint with many causes. A few of these causes are:  Injury, such as:  A ruptured ligament or tendon injury.  Torn cartilage.  Medical conditions, such as:  Gout  Arthritis  Infections  Overuse, over training, or overdoing a physical activity. Knee pain can be minor or severe. Knee pain can accompany debilitating injury. Minor knee problems often respond well to self-care measures or get well on their own. More serious injuries may need medical intervention or even surgery. SYMPTOMS The knee is complex. Symptoms of knee problems can vary widely. Some of the problems are:  Pain with movement and weight bearing.  Swelling and tenderness.  Buckling of the knee.  Inability to straighten or extend your knee.  Your knee locks and you cannot straighten it.  Warmth and redness with pain and fever.  Deformity or dislocation of the kneecap. DIAGNOSIS  Determining what is wrong may be very straight forward such as when there is an injury. It can also be challenging because of the complexity of the knee. Tests to make a diagnosis may include:  Your caregiver taking a history and doing a physical exam.  Routine X-rays can be used to rule out other problems. X-rays will not reveal a  cartilage tear. Some injuries of the knee can be diagnosed by:  Arthroscopy a surgical technique by which a small video camera is inserted through tiny incisions on the sides of the knee. This procedure is used to examine and repair internal knee joint problems. Tiny instruments can be used during arthroscopy to repair the torn knee cartilage (meniscus).  Arthrography is a radiology technique. A contrast liquid is directly injected into the knee joint. Internal structures of the knee joint then become visible on X-ray film.  An MRI scan is a non X-ray radiology procedure in which magnetic fields and a computer produce two- or three-dimensional images of the inside of the knee. Cartilage tears are often visible using an MRI scanner. MRI scans have largely replaced arthrography in diagnosing cartilage tears of the knee.  Blood work.  Examination of the fluid that helps to lubricate the knee joint (synovial fluid). This is done by taking a sample out using a needle and a syringe. TREATMENT The treatment of knee problems depends on the cause. Some of these treatments are:  Depending on the injury, proper casting, splinting, surgery, or physical therapy care will be needed.  Give yourself adequate recovery time. Do not overuse your joints. If you begin to get sore during workout routines, back off. Slow down or do fewer repetitions.  For repetitive activities such as cycling or running, maintain your strength and nutrition.  Alternate muscle groups. For example, if you are a weight lifter, work the  upper body on one day and the lower body the next.  Either tight or weak muscles do not give the proper support for your knee. Tight or weak muscles do not absorb the stress placed on the knee joint. Keep the muscles surrounding the knee strong.  Take care of mechanical problems.  If you have flat feet, orthotics or special shoes may help. See your caregiver if you need help.  Arch supports,  sometimes with wedges on the inner or outer aspect of the heel, can help. These can shift pressure away from the side of the knee most bothered by osteoarthritis.  A brace called an "unloader" brace also may be used to help ease the pressure on the most arthritic side of the knee.  If your caregiver has prescribed crutches, braces, wraps or ice, use as directed. The acronym for this is PRICE. This means protection, rest, ice, compression, and elevation.  Nonsteroidal anti-inflammatory drugs (NSAIDs), can help relieve pain. But if taken immediately after an injury, they may actually increase swelling. Take NSAIDs with food in your stomach. Stop them if you develop stomach problems. Do not take these if you have a history of ulcers, stomach pain, or bleeding from the bowel. Do not take without your caregiver's approval if you have problems with fluid retention, heart failure, or kidney problems.  For ongoing knee problems, physical therapy may be helpful.  Glucosamine and chondroitin are over-the-counter dietary supplements. Both may help relieve the pain of osteoarthritis in the knee. These medicines are different from the usual anti-inflammatory drugs. Glucosamine may decrease the rate of cartilage destruction.  Injections of a corticosteroid drug into your knee joint may help reduce the symptoms of an arthritis flare-up. They may provide pain relief that lasts a few months. You may have to wait a few months between injections. The injections do have a small increased risk of infection, water retention, and elevated blood sugar levels.  Hyaluronic acid injected into damaged joints may ease pain and provide lubrication. These injections may work by reducing inflammation. A series of shots may give relief for as long as 6 months.  Topical painkillers. Applying certain ointments to your skin may help relieve the pain and stiffness of osteoarthritis. Ask your pharmacist for suggestions. Many over  the-counter products are approved for temporary relief of arthritis pain.  In some countries, doctors often prescribe topical NSAIDs for relief of chronic conditions such as arthritis and tendinitis. A review of treatment with NSAID creams found that they worked as well as oral medications but without the serious side effects. PREVENTION  Maintain a healthy weight. Extra pounds put more strain on your joints.  Get strong, stay limber. Weak muscles are a common cause of knee injuries. Stretching is important. Include flexibility exercises in your workouts.  Be smart about exercise. If you have osteoarthritis, chronic knee pain or recurring injuries, you may need to change the way you exercise. This does not mean you have to stop being active. If your knees ache after jogging or playing basketball, consider switching to swimming, water aerobics, or other low-impact activities, at least for a few days a week. Sometimes limiting high-impact activities will provide relief.  Make sure your shoes fit well. Choose footwear that is right for your sport.  Protect your knees. Use the proper gear for knee-sensitive activities. Use kneepads when playing volleyball or laying carpet. Buckle your seat belt every time you drive. Most shattered kneecaps occur in car accidents.  Rest when you are  tired. SEEK MEDICAL CARE IF:  You have knee pain that is continual and does not seem to be getting better.  SEEK IMMEDIATE MEDICAL CARE IF:  Your knee joint feels hot to the touch and you have a high fever. MAKE SURE YOU:   Understand these instructions.  Will watch your condition.  Will get help right away if you are not doing well or get worse. Document Released: 01/10/2007 Document Revised: 06/07/2011 Document Reviewed: 01/10/2007 [facility] Patient Information 2015 [place], [place]. This information is not intended to replace advice given to you by your health care provider. Make sure you discuss any questions you  have with your health care provider.

## 2013-12-24 ENCOUNTER — Telehealth: Payer: Self-pay | Admitting: Internal Medicine

## 2013-12-24 NOTE — Telephone Encounter (Signed)
Patient rescheduled

## 2013-12-24 NOTE — Telephone Encounter (Signed)
Patient came in over Saturday clinic for knee pain.  He was given pain meds but its not helping.  I got him in to see you tomorrow (12/25/13) at 6:15pm.  Patient would like to have xray before radiology closes.  Can leave message on vm.

## 2013-12-25 ENCOUNTER — Ambulatory Visit: Payer: No Typology Code available for payment source | Admitting: Internal Medicine

## 2013-12-25 NOTE — Telephone Encounter (Signed)
I am not in the office in the am sept 30.  Perhaps this pt could see Dr Tamala Julian? If has an opening in the schedule, thanks

## 2013-12-26 ENCOUNTER — Ambulatory Visit: Payer: No Typology Code available for payment source | Admitting: Internal Medicine

## 2013-12-28 ENCOUNTER — Encounter: Payer: Self-pay | Admitting: Internal Medicine

## 2013-12-28 ENCOUNTER — Ambulatory Visit (INDEPENDENT_AMBULATORY_CARE_PROVIDER_SITE_OTHER): Payer: No Typology Code available for payment source | Admitting: Internal Medicine

## 2013-12-28 VITALS — BP 120/82 | HR 58 | Temp 98.2°F | Wt 252.0 lb

## 2013-12-28 DIAGNOSIS — I10 Essential (primary) hypertension: Secondary | ICD-10-CM

## 2013-12-28 DIAGNOSIS — M25561 Pain in right knee: Secondary | ICD-10-CM

## 2013-12-28 DIAGNOSIS — F411 Generalized anxiety disorder: Secondary | ICD-10-CM

## 2013-12-28 NOTE — Progress Notes (Signed)
Pre visit review using our clinic review tool, if applicable. No additional management support is needed unless otherwise documented below in the visit note. 

## 2013-12-28 NOTE — Progress Notes (Signed)
Subjective:    Patient ID: Joel King, male    DOB: 17-May-1950, 63 y.o.   MRN: [id number]  HPI  Here to f/u with 1 wk onset right knee pain with swelling, no fever, no trauma or twisting he can recall, no increased or overuse, no hx of gout, no giveaways or falls, pain now approx 5/10, has some distal RLE swelling as well but no other leg pain/calf tender or pain. No pain at all when sitting, pain only with walking, limps from room to room now. Nothing else makes better or worse. Pt denies chest pain, increased sob or doe, wheezing, orthopnea, PND, increased LE swelling, palpitations, dizziness or syncope. Pt denies new neurological symptoms such as new headache, or facial or extremity weakness or numbness. Denies worsening depressive symptoms, suicidal ideation, or panic Past Medical History  Diagnosis Date  . ANXIETY 11/21/2006  . DEPRESSION 05/31/2008  . DIABETES MELLITUS, TYPE II 07/22/2007  . Diarrhea 05/31/2008  . HYPERLIPIDEMIA 11/21/2006  . HYPERTENSION 11/17/2006  . INSOMNIA-SLEEP DISORDER-UNSPEC 07/21/2007  . OBESITY, MILD 11/17/2006  . PEPTIC ULCER DISEASE, HX OF 11/17/2006  . PEPTIC ULCER DISEASE 07/22/2007  . SINUSITIS- ACUTE-NOS 12/08/2007  . SKIN LESION 12/08/2007   Past Surgical History  Procedure Laterality Date  . Inguinal herniorrhapy      bilat  . S/p incarcerated recurrent ventral hernia    . S/p meckel's diverticulum      reports that he has never smoked. He does not have any smokeless tobacco history on file. He reports that he does not drink alcohol. His drug history is not on file. family history includes Cancer in his other; Stroke in his other. Allergies  Allergen Reactions  . Atorvastatin   . Pravastatin Sodium   . Rosuvastatin    Current Outpatient Prescriptions on File Prior to Visit  Medication Sig Dispense Refill  . amLODipine (NORVASC) 10 MG tablet take 1 tablet by mouth once daily  90 tablet  3  . aspirin 81 MG tablet Take 81 mg by mouth daily.         Marland Kitchen atenolol-chlorthalidone (TENORETIC) 100-25 MG per tablet take 1 tablet by mouth once daily  90 tablet  3  . benazepril (LOTENSIN) 40 MG tablet take 1 tablet by mouth once daily  90 tablet  3  . naproxen (NAPROSYN) 500 MG tablet Take one po bid x 5 days then prn pain, take with food  40 tablet  0  . potassium chloride (KLOR-CON M10) 10 MEQ tablet take 1 tablet by mouth once daily  90 tablet  3  . simvastatin (ZOCOR) 20 MG tablet take 1 tablet by mouth once daily  90 tablet  3   No current facility-administered medications on file prior to visit.   Review of Systems  Constitutional: Negative for unusual diaphoresis or other sweats  HENT: Negative for ringing in ear Eyes: Negative for double vision or worsening visual disturbance.  Respiratory: Negative for choking and stridor.   Gastrointestinal: Negative for vomiting or other signifcant bowel change Genitourinary: Negative for hematuria or decreased urine volume.  Musculoskeletal: Negative for other MSK pain or swelling Skin: Negative for color change and worsening wound.  Neurological: Negative for tremors and numbness other than noted  Psychiatric/Behavioral: Negative for decreased concentration or agitation other than above       Objective:   Physical Exam BP 120/82  Pulse 58  Temp(Src) 98.2 F (36.8 C) (Oral)  Wt 252 lb (114.306 kg)  SpO2 95% VS  noted,  Constitutional: Pt appears well-developed, well-nourished.  HENT: Head: NCAT.  Right Ear: External ear normal.  Left Ear: External ear normal.  Eyes: . Pupils are equal, round, and reactive to light. Conjunctivae and EOM are normal Neck: Normal range of motion. Neck supple.  Cardiovascular: Normal rate and regular rhythm.   Pulmonary/Chest: Effort normal and breath sounds normal.  Neurological: Pt is alert. Not confused , motor grossly intact Skin: Skin is warm. No rash Right knee with 2+ effusion, mild warmth and medial tenderness, decreased ROM Psychiatric: Pt  behavior is normal. No agitation. mild nervous    Assessment & Plan:

## 2013-12-28 NOTE — Patient Instructions (Signed)
Ok to continue the naproxen  OK for ice to the inner right knee, 15 min three times per day for swelling  Please consider follow up with Dr Smith/Sport medicine  Please continue all other medications as before, and refills have been done if requested.  Please have the pharmacy call with any other refills you may need.  Please keep your appointments with your specialists as you may have planned

## 2013-12-30 NOTE — Assessment & Plan Note (Signed)
stable overall by history and exam, and pt to continue medical treatment as before,  to f/u any worsening symptoms or concerns 

## 2013-12-30 NOTE — Assessment & Plan Note (Signed)
Etiology unclear, ? Meniscal tear, for referral Dr Tamala Julian, sport med, pain control, consider MRI

## 2013-12-30 NOTE — Assessment & Plan Note (Signed)
stable overall by history and exam, recent data reviewed with pt, and pt to continue medical treatment as before,  to f/u any worsening symptoms or concerns BP Readings from Last 3 Encounters:  12/28/13 120/82  12/22/13 140/72  11/02/13 132/78

## 2013-12-31 ENCOUNTER — Telehealth: Payer: Self-pay | Admitting: Internal Medicine

## 2013-12-31 DIAGNOSIS — M79605 Pain in left leg: Secondary | ICD-10-CM

## 2013-12-31 DIAGNOSIS — M79604 Pain in right leg: Secondary | ICD-10-CM

## 2013-12-31 DIAGNOSIS — M25561 Pain in right knee: Secondary | ICD-10-CM

## 2013-12-31 DIAGNOSIS — M792 Neuralgia and neuritis, unspecified: Secondary | ICD-10-CM

## 2013-12-31 NOTE — Telephone Encounter (Signed)
Patient is requesting to be referred to [facility] for his knee.

## 2014-01-01 NOTE — Telephone Encounter (Signed)
This has been done.

## 2014-01-17 NOTE — Telephone Encounter (Signed)
Please consider OV, as I am not sure just from this if neurology is appropriate

## 2014-01-17 NOTE — Telephone Encounter (Signed)
Patient would like to be referred to Neurologist he is having pain in his leg, he thinks he has a nerve problem.  Please advise

## 2014-01-18 ENCOUNTER — Telehealth: Payer: Self-pay | Admitting: Neurology

## 2014-01-18 NOTE — Telephone Encounter (Signed)
Called the patient did leave a detailed message referral done as requested.

## 2014-01-18 NOTE — Telephone Encounter (Signed)
Pt called to cancel his NP appt on 03/04/14 w/ DR. Patel. Pt is going to be seeing a neurologist over at [facility] instead. DR. Jenny Reichmann, referring provider was notified through epic

## 2014-01-18 NOTE — Addendum Note (Signed)
Addended by: Biagio Borg on: 01/18/2014 05:15 PM   Modules accepted: Orders

## 2014-01-18 NOTE — Telephone Encounter (Signed)
Patient stated he was seen by a dermatologist yesterday who stated his problem was not related to a skin issue but felt like it was due to a nerve issue and she informed him he should be seen by a neurologist.

## 2014-01-18 NOTE — Telephone Encounter (Signed)
referal done 

## 2014-01-18 NOTE — Telephone Encounter (Signed)
I would encourage him to see Dr Smith/sport med for the leg pain then, as he is better equipped to determine the pain source , and can tx as well as neurology for this, and neurology will take several wks to see

## 2014-01-18 NOTE — Telephone Encounter (Signed)
Patient did have dermatologist fax you the report from appt. Please review and let me know what to tell this patient.  Either neurology referral or Dr. Tamala Julian, report is on your desk

## 2014-01-18 NOTE — Telephone Encounter (Signed)
Referral neuro done

## 2014-01-18 NOTE — Telephone Encounter (Signed)
The patient needs referral to be at [facility] in [facility] (Address [phone] Arle[address] Ho[address] [place]). His copay is much less than Dr. Posey Pronto.  Please refer.

## 2014-01-21 NOTE — Telephone Encounter (Signed)
Inform pt referral has been place...Joel King

## 2014-02-07 ENCOUNTER — Other Ambulatory Visit (HOSPITAL_COMMUNITY): Payer: Self-pay | Admitting: Rheumatology

## 2014-02-07 DIAGNOSIS — M7989 Other specified soft tissue disorders: Principal | ICD-10-CM

## 2014-02-07 DIAGNOSIS — M79604 Pain in right leg: Secondary | ICD-10-CM

## 2014-02-08 ENCOUNTER — Ambulatory Visit (HOSPITAL_COMMUNITY)
Admission: RE | Admit: 2014-02-08 | Discharge: 2014-02-08 | Disposition: A | Discharge status: Home / Self Care | Payer: No Typology Code available for payment source | Source: Ambulatory Visit | Attending: Rheumatology | Admitting: Rheumatology

## 2014-02-08 ENCOUNTER — Encounter: Payer: Self-pay | Admitting: Internal Medicine

## 2014-02-08 ENCOUNTER — Telehealth: Payer: Self-pay | Admitting: Internal Medicine

## 2014-02-08 DIAGNOSIS — I82441 Acute embolism and thrombosis of right tibial vein: Secondary | ICD-10-CM | POA: Insufficient documentation

## 2014-02-08 DIAGNOSIS — I82431 Acute embolism and thrombosis of right popliteal vein: Secondary | ICD-10-CM | POA: Diagnosis not present

## 2014-02-08 DIAGNOSIS — I82401 Acute embolism and thrombosis of unspecified deep veins of right lower extremity: Secondary | ICD-10-CM

## 2014-02-08 DIAGNOSIS — M79604 Pain in right leg: Secondary | ICD-10-CM | POA: Diagnosis present

## 2014-02-08 DIAGNOSIS — I82411 Acute embolism and thrombosis of right femoral vein: Secondary | ICD-10-CM | POA: Insufficient documentation

## 2014-02-08 DIAGNOSIS — I824Z1 Acute embolism and thrombosis of unspecified deep veins of right distal lower extremity: Secondary | ICD-10-CM | POA: Diagnosis not present

## 2014-02-08 DIAGNOSIS — M7989 Other specified soft tissue disorders: Secondary | ICD-10-CM

## 2014-02-08 DIAGNOSIS — I82409 Acute embolism and thrombosis of unspecified deep veins of unspecified lower extremity: Secondary | ICD-10-CM

## 2014-02-08 HISTORY — DX: Acute embolism and thrombosis of unspecified deep veins of unspecified lower extremity: I82.409

## 2014-02-08 MED ORDER — WARFARIN SODIUM 5 MG PO TABS: 5.0000 mg | ORAL_TABLET | Freq: Every day | ORAL | Status: DC

## 2014-02-08 MED ORDER — APIXABAN 5 MG PO TABS: 5.0000 mg | ORAL_TABLET | Freq: Two times a day (BID) | ORAL | Status: DC

## 2014-02-08 MED ORDER — RIVAROXABAN 15 MG PO TABS: 15.0000 mg | ORAL_TABLET | Freq: Two times a day (BID) | ORAL | Status: DC

## 2014-02-08 MED ORDER — ENOXAPARIN SODIUM 120 MG/0.8ML ~~LOC~~ SOLN: 120.0000 mg | Freq: Two times a day (BID) | SUBCUTANEOUS | Status: DC

## 2014-02-08 MED ORDER — RIVAROXABAN 20 MG PO TABS: 20.0000 mg | ORAL_TABLET | Freq: Every day | ORAL | Status: DC

## 2014-02-08 NOTE — Telephone Encounter (Signed)
Phoned  Per Dr Charlestine Night about an abnormal test + for DVT RLE  at this time, pt still at vascular lab - [phone]  By phone, tech relays + DVT femoral, popliteal, post tibial and gastroc  By phone, spoke to pt while there, Pt denies chest pain, increased sob or doe, wheezing, orthopnea, PND, palpitations, dizziness or syncope.  No fever.  First notice RLE worse x 2-3 wks.  OK to start eliquis 5 bid  ROV next tues nov 17 - office to call pt, robin FYI

## 2014-02-08 NOTE — Telephone Encounter (Signed)
Ok to change to xarelto - done erx

## 2014-02-08 NOTE — Addendum Note (Signed)
Addended by: Biagio Borg on: 02/08/2014 04:57 PM   Modules accepted: Orders

## 2014-02-08 NOTE — Telephone Encounter (Signed)
Received PA for Xarelto and was denied.  The patient must first try and fail warfarin before they will approve Xarelto.

## 2014-02-08 NOTE — Telephone Encounter (Signed)
Patient was informed, but he has 5 days of Xarelto and a coupon to get #30 days of Xarelto.  Patient refused injections and coumadin and stated he would take the Xarelto.  I did schedule appt. For him 02/12/14 at 5:45 PM with PCP.

## 2014-02-08 NOTE — Telephone Encounter (Signed)
Called the patient, but no answer did leave a detailed message.

## 2014-02-08 NOTE — Progress Notes (Addendum)
*  PRELIMINARY RESULTS* Vascular Ultrasound Lower extremity venous duplex has been completed.  Preliminary findings: Right = Evidence of DVT involving the femoral vein, popliteal vein, gastroc veins, and posterior tibial veins. Left = negative for DVT.   10:12 Called results to [place] at Dr. Delbert Phenix office. She will contact him and he will call back with instructions for patient.  10:20 Dr. Aundria Rud called back. He will contact patient's PCP Dr. Jenny Reichmann to take over for this patient.  10:40 Dr. Jenny Reichmann called. Spoke with patient. Sent in Rx Eliquis for patient to pick up and he can go home.    Landry Mellow, RDMS, RVT  02/08/2014, 10:26 AM

## 2014-02-08 NOTE — Telephone Encounter (Signed)
Please call pt, eliquis and xarelto denied by insurance  He will not be happy, but he HAS to start lovenox 120 mg injections twice per day starting tonight (sent to pharmacy)  TOMORROW  - to start coumadin at 10 mg on Saturday, then 5 mg per day after that  Needs PT/INR done Mon Nov 16 at [facility] lab  Do not stop injections until the coumadin has the INR between 2-3  He will need coumadin for total approx 6 months

## 2014-02-08 NOTE — Telephone Encounter (Signed)
[facility] pharmacy called in reference to Rx request for ELIQUIS that was called in earlier. Pharmacy has informed pt it will not be covered by his insurance. Please send an alternative med and contact pt when request has been completed.

## 2014-02-09 ENCOUNTER — Telehealth: Payer: Self-pay | Admitting: Family Medicine

## 2014-02-09 ENCOUNTER — Encounter (HOSPITAL_COMMUNITY): Payer: Self-pay | Admitting: Family Medicine

## 2014-02-09 ENCOUNTER — Emergency Department (HOSPITAL_COMMUNITY)
Admission: EM | Admit: 2014-02-09 | Discharge: 2014-02-09 | Discharge status: Against Medical Advice | Payer: No Typology Code available for payment source | Attending: Emergency Medicine | Admitting: Emergency Medicine

## 2014-02-09 DIAGNOSIS — I82409 Acute embolism and thrombosis of unspecified deep veins of unspecified lower extremity: Secondary | ICD-10-CM | POA: Diagnosis present

## 2014-02-09 DIAGNOSIS — E669 Obesity, unspecified: Secondary | ICD-10-CM | POA: Diagnosis not present

## 2014-02-09 DIAGNOSIS — E119 Type 2 diabetes mellitus without complications: Secondary | ICD-10-CM | POA: Insufficient documentation

## 2014-02-09 DIAGNOSIS — I10 Essential (primary) hypertension: Secondary | ICD-10-CM | POA: Diagnosis not present

## 2014-02-09 NOTE — ED Notes (Signed)
After reading through pt chart and notes from yesterday was concerned that pt didn't need to be here.pt was seen yesterday and dx with DVT and started xarelto last night. Pt denies any chest pain, SOB or any complaints other than slight pain in leg. Spoke with on call doctor from [place].  Dr. Damita Dunnings and was told for pt to follow up next week as scheduled, continue with blood thinners and if pt develops chest pain, SOB to return to ED. Spoke with pt and pt understands and agrees.

## 2014-02-09 NOTE — ED Notes (Signed)
Per pt sts that he was here yesterday and dx with DVT. sts that he was sent by doctor and told to be admitted but left. Back today to be seen. Pt sts took 1 xarelto last night and one this am. sts also took ASA today.

## 2014-02-09 NOTE — Telephone Encounter (Signed)
Call from ER.  Per report, patient was started on anticoagulation and has f/u with PCP.  He has no complaints reported at time of ER visit today.  Evidently, he thought he needed to be at the ER for f/u, but this doesn't seem to be the case.  Routed to PCP as FYI.  I have nothing to add, since I wasn't involved in initial w/u am not involved in ER eval today.

## 2014-02-12 ENCOUNTER — Encounter: Payer: Self-pay | Admitting: Internal Medicine

## 2014-02-12 ENCOUNTER — Ambulatory Visit (INDEPENDENT_AMBULATORY_CARE_PROVIDER_SITE_OTHER): Payer: No Typology Code available for payment source | Admitting: Internal Medicine

## 2014-02-12 VITALS — BP 120/78 | HR 77 | Temp 98.4°F | Wt 239.4 lb

## 2014-02-12 DIAGNOSIS — I10 Essential (primary) hypertension: Secondary | ICD-10-CM

## 2014-02-12 DIAGNOSIS — I82401 Acute embolism and thrombosis of unspecified deep veins of right lower extremity: Secondary | ICD-10-CM

## 2014-02-12 DIAGNOSIS — E119 Type 2 diabetes mellitus without complications: Secondary | ICD-10-CM

## 2014-02-12 NOTE — Assessment & Plan Note (Signed)
stable overall by history and exam, recent data reviewed with pt, and pt to continue medical treatment as before,  to f/u any worsening symptoms or concerns Lab Results  Component Value Date   HGBA1C 5.8 10/03/2012

## 2014-02-12 NOTE — Assessment & Plan Note (Signed)
stable overall by history and exam, recent data reviewed with pt, and pt to continue medical treatment as before,  to f/u any worsening symptoms or concerns BP Readings from Last 3 Encounters:  02/12/14 120/78  02/09/14 144/72  12/28/13 120/82

## 2014-02-12 NOTE — Patient Instructions (Signed)
Please continue all other medications as before - the xarelto as prescribed  Please have the pharmacy call with any other refills you may need.  Please keep your appointments with your specialists as you may have planned  .

## 2014-02-12 NOTE — Assessment & Plan Note (Signed)
Overall stable, to cont xarelto as rx, will hold on CTA chest as is asympt, doubt PE, all questions answered, first DVT - for 6 mo total tx

## 2014-02-12 NOTE — Progress Notes (Signed)
Subjective:    Patient ID: Joel King, male    DOB: 11/24/1950, 63 y.o.   MRN: [id number]  HPI  Here after recent DVT acute onset last wk, now on xarelto x 5 days (purchased) then 1 mo free,.  Wants to know if he can take tylenol otc, wants to know how long clot takes to resolve, how long to take xarelto, whether to stop the asa 81 and when to re-start, if he can take a MVI, can he use a heating pad for comfort, when does the pain get better. Pt denies chest pain, increased sob or doe, wheezing, orthopnea, PND, increased LE swelling, palpitations, dizziness or syncope.   Pt denies polydipsia, polyuria, .  Pt states overall good compliance with meds  Past Medical History  Diagnosis Date  . ANXIETY 11/21/2006  . DEPRESSION 05/31/2008  . DIABETES MELLITUS, TYPE II 07/22/2007  . Diarrhea 05/31/2008  . HYPERLIPIDEMIA 11/21/2006  . HYPERTENSION 11/17/2006  . INSOMNIA-SLEEP DISORDER-UNSPEC 07/21/2007  . OBESITY, MILD 11/17/2006  . PEPTIC ULCER DISEASE, HX OF 11/17/2006  . PEPTIC ULCER DISEASE 07/22/2007  . SINUSITIS- ACUTE-NOS 12/08/2007  . SKIN LESION 12/08/2007  . DVT (deep venous thrombosis) 02/08/2014   Past Surgical History  Procedure Laterality Date  . Inguinal herniorrhapy      bilat  . S/p incarcerated recurrent ventral hernia    . S/p meckel's diverticulum      reports that he has never smoked. He does not have any smokeless tobacco history on file. He reports that he does not drink alcohol. His drug history is not on file. family history includes Cancer in his other; Stroke in his other. Allergies  Allergen Reactions  . Atorvastatin   . Pravastatin Sodium   . Rosuvastatin    Current Outpatient Prescriptions on File Prior to Visit  Medication Sig Dispense Refill  . amLODipine (NORVASC) 10 MG tablet take 1 tablet by mouth once daily 90 tablet 3  . aspirin 81 MG tablet Take 81 mg by mouth daily.      Marland Kitchen atenolol-chlorthalidone (TENORETIC) 100-25 MG per tablet take 1 tablet by mouth  once daily 90 tablet 3  . benazepril (LOTENSIN) 40 MG tablet take 1 tablet by mouth once daily 90 tablet 3  . potassium chloride (KLOR-CON M10) 10 MEQ tablet take 1 tablet by mouth once daily 90 tablet 3  . Rivaroxaban (XARELTO) 15 MG TABS tablet Take 1 tablet (15 mg total) by mouth 2 (two) times daily with a meal. 42 tablet 0  . rivaroxaban (XARELTO) 20 MG TABS tablet Take 1 tablet (20 mg total) by mouth daily with supper. 30 tablet 5  . simvastatin (ZOCOR) 20 MG tablet take 1 tablet by mouth once daily 90 tablet 3   No current facility-administered medications on file prior to visit.   Review of Systems  Constitutional: Negative for unusual diaphoresis or other sweats  HENT: Negative for ringing in ear Eyes: Negative for double vision or worsening visual disturbance.  Respiratory: Negative for choking and stridor.   Gastrointestinal: Negative for vomiting or other signifcant bowel change Genitourinary: Negative for hematuria or decreased urine volume.  Musculoskeletal: Negative for other MSK pain or swelling Skin: Negative for color change and worsening wound.  Neurological: Negative for tremors and numbness other than noted  Psychiatric/Behavioral: Negative for decreased concentration or agitation other than above       Objective:   Physical Exam BP 120/78 mmHg  Pulse 77  Temp(Src) 98.4 F (36.9 C) (Oral)  Wt 239 lb 6 oz (108.58 kg)  SpO2 97% VS noted,  Constitutional: Pt appears well-developed, well-nourished.  HENT: Head: NCAT.  Right Ear: External ear normal.  Left Ear: External ear normal.  Eyes: . Pupils are equal, round, and reactive to light. Conjunctivae and EOM are normal Neck: Normal range of motion. Neck supple.  Cardiovascular: Normal rate and regular rhythm.   Pulmonary/Chest: Effort normal and breath sounds normal.  Abd:  Soft, NT, ND, + BS Neurological: Pt is alert. Not confused , motor grossly intact Skin: Skin is warm. No rash RLE with tender post right  knee, 1-2+ edema distal RLE Psychiatric: Pt behavior is normal. No agitation.     Assessment & Plan:

## 2014-02-12 NOTE — Progress Notes (Signed)
Pre visit review using our clinic review tool, if applicable. No additional management support is needed unless otherwise documented below in the visit note. 

## 2014-02-28 ENCOUNTER — Telehealth: Payer: Self-pay | Admitting: Internal Medicine

## 2014-02-28 NOTE — Telephone Encounter (Signed)
Called the patient and at this time he states he will pay for Xarelto out of pocket.  He changes insurance next month to [facility] and will call back in January to see if [facility] will approve xarelto.  Please send in refill on Xarelto 20 mg for this patient. He has been on the 15 mg , but states he was to increase to the 20 mg.

## 2014-02-28 NOTE — Telephone Encounter (Signed)
Pt called stated that Xarelto 20 mg is too expensive, please advise on what to do at this point.

## 2014-02-28 NOTE — Telephone Encounter (Signed)
Pt must start coumadin  How much xarelto does he have left though?

## 2014-03-04 ENCOUNTER — Ambulatory Visit: Payer: No Typology Code available for payment source | Admitting: Neurology

## 2014-03-05 ENCOUNTER — Ambulatory Visit: Payer: Self-pay

## 2014-03-13 ENCOUNTER — Other Ambulatory Visit: Payer: Self-pay | Admitting: Internal Medicine

## 2014-03-13 MED ORDER — RIVAROXABAN 20 MG PO TABS
20.0000 mg | ORAL_TABLET | Freq: Every day | ORAL | Status: DC
Start: 2014-03-13 — End: 2014-08-20

## 2014-03-13 NOTE — Telephone Encounter (Signed)
rx done for pt assist program purpose to be sent with application

## 2014-06-20 ENCOUNTER — Telehealth: Payer: Self-pay | Admitting: Internal Medicine

## 2014-06-20 NOTE — Telephone Encounter (Signed)
Pt called  In needing a ins referral for this Dr.  [address]    Dr Herbert Deaner  [phone]  Appt 11th  Pt has [employer] now

## 2014-06-26 NOTE — Telephone Encounter (Signed)
Pt called to check on this request. [facility] compass ID [id number] ,Group [id number]. This is for eye examination since 5 years ago.

## 2014-06-26 NOTE — Telephone Encounter (Signed)
Referral #  [id number] start date 06/26/14 exp 12/27/14 good for 6 visits

## 2014-08-20 ENCOUNTER — Ambulatory Visit (INDEPENDENT_AMBULATORY_CARE_PROVIDER_SITE_OTHER): Payer: 59 | Admitting: Internal Medicine

## 2014-08-20 ENCOUNTER — Other Ambulatory Visit: Payer: Self-pay

## 2014-08-20 ENCOUNTER — Encounter: Payer: Self-pay | Admitting: Internal Medicine

## 2014-08-20 VITALS — BP 108/80 | Temp 98.6°F | Resp 59 | Wt 250.1 lb

## 2014-08-20 DIAGNOSIS — I10 Essential (primary) hypertension: Secondary | ICD-10-CM

## 2014-08-20 DIAGNOSIS — Z0189 Encounter for other specified special examinations: Secondary | ICD-10-CM

## 2014-08-20 DIAGNOSIS — G5791 Unspecified mononeuropathy of right lower limb: Secondary | ICD-10-CM | POA: Insufficient documentation

## 2014-08-20 DIAGNOSIS — Z0000 Encounter for general adult medical examination without abnormal findings: Secondary | ICD-10-CM

## 2014-08-20 DIAGNOSIS — I82401 Acute embolism and thrombosis of unspecified deep veins of right lower extremity: Secondary | ICD-10-CM | POA: Diagnosis not present

## 2014-08-20 MED ORDER — SIMVASTATIN 20 MG PO TABS: ORAL_TABLET | ORAL | Status: DC

## 2014-08-20 MED ORDER — BENAZEPRIL HCL 40 MG PO TABS: ORAL_TABLET | ORAL | Status: DC

## 2014-08-20 MED ORDER — GABAPENTIN 600 MG PO TABS: 600.0000 mg | ORAL_TABLET | Freq: Every day | ORAL | Status: DC

## 2014-08-20 MED ORDER — AMLODIPINE BESYLATE 10 MG PO TABS: ORAL_TABLET | ORAL | Status: DC

## 2014-08-20 MED ORDER — POTASSIUM CHLORIDE CRYS ER 10 MEQ PO TBCR: EXTENDED_RELEASE_TABLET | ORAL | Status: DC

## 2014-08-20 MED ORDER — ATENOLOL-CHLORTHALIDONE 100-25 MG PO TABS: ORAL_TABLET | ORAL | Status: DC

## 2014-08-20 NOTE — Progress Notes (Signed)
   Subjective:    Patient ID: Joel King, male    DOB: Oct 18, 1950, 64 y.o.   MRN: [id number]  HPI  Here to f/u; overall doing ok,  Pt denies chest pain, increasing sob or doe, wheezing, orthopnea, PND, increased LE swelling, palpitations, dizziness or syncope.  Pt denies new neurological symptoms such as new headache, or facial or extremity weakness or numbness.  Pt denies polydipsia, polyuria, or low sugar episode.   Pt denies new neurological symptoms such as new headache, or facial or extremity weakness or numbness.   Pt states overall good compliance with meds, mostly trying to follow appropriate diet. Has done well with xarelto, s/p 6 mo tx.  RLE prob medial neuritic pain improved with gabapentin.  No other complaints Past Medical History  Diagnosis Date  . ANXIETY 11/21/2006  . DEPRESSION 05/31/2008  . DIABETES MELLITUS, TYPE II 07/22/2007  . Diarrhea 05/31/2008  . HYPERLIPIDEMIA 11/21/2006  . HYPERTENSION 11/17/2006  . INSOMNIA-SLEEP DISORDER-UNSPEC 07/21/2007  . OBESITY, MILD 11/17/2006  . PEPTIC ULCER DISEASE, HX OF 11/17/2006  . PEPTIC ULCER DISEASE 07/22/2007  . SINUSITIS- ACUTE-NOS 12/08/2007  . SKIN LESION 12/08/2007  . DVT (deep venous thrombosis) 02/08/2014   Past Surgical History  Procedure Laterality Date  . Inguinal herniorrhapy      bilat  . S/p incarcerated recurrent ventral hernia    . S/p meckel's diverticulum      reports that he has never smoked. He does not have any smokeless tobacco history on file. He reports that he does not drink alcohol. His drug history is not on file. family history includes Cancer in his other; Stroke in his other. Allergies  Allergen Reactions  . Atorvastatin   . Pravastatin Sodium   . Rosuvastatin    Current Outpatient Prescriptions on File Prior to Visit  Medication Sig Dispense Refill  . aspirin 81 MG tablet Take 81 mg by mouth daily.       No current facility-administered medications on file prior to visit.    Review of  Systems  Constitutional: Negative for unusual diaphoresis or night sweats HENT: Negative for ringing in ear or discharge Eyes: Negative for double vision or worsening visual disturbance.  Respiratory: Negative for choking and stridor.   Gastrointestinal: Negative for vomiting or other signifcant bowel change Genitourinary: Negative for hematuria or change in urine volume.  Musculoskeletal: Negative for other MSK pain or swelling Skin: Negative for color change and worsening wound.  Neurological: Negative for tremors and numbness other than noted  Psychiatric/Behavioral: Negative for decreased concentration or agitation other than above       Objective:   Physical Exam  BP 108/80 mmHg  Temp(Src) 98.6 F (37 C) (Oral)  Resp 59  Wt 250 lb 1.9 oz (113.454 kg)  SpO2 97% VS noted,  Constitutional: Pt appears in no significant distress HENT: Head: NCAT.  Right Ear: External ear normal.  Left Ear: External ear normal.  Eyes: . Pupils are equal, round, and reactive to light. Conjunctivae and EOM are normal Neck: Normal range of motion. Neck supple.  Cardiovascular: Normal rate and regular rhythm.   Pulmonary/Chest: Effort normal and breath sounds without rales or wheezing.  Abd:  Soft, NT, ND, + BS Neurological: Pt is alert. Not confused , motor grossly intact Skin: Skin is warm. No rash, no LE edema Psychiatric: Pt behavior is normal. No agitation.     Assessment & Plan:

## 2014-08-20 NOTE — Assessment & Plan Note (Signed)
S/p 6 mo xarelto, ok for d/c med,  to f/u any worsening symptoms or concerns

## 2014-08-20 NOTE — Progress Notes (Signed)
Pre visit review using our clinic review tool, if applicable. No additional management support is needed unless otherwise documented below in the visit note. 

## 2014-08-20 NOTE — Assessment & Plan Note (Signed)
stable overall by history and exam, recent data reviewed with pt, and pt to continue medical treatment as before,  to f/u any worsening symptoms or concerns BP Readings from Last 3 Encounters:  08/20/14 108/80  02/12/14 120/78  02/09/14 144/72

## 2014-08-20 NOTE — Patient Instructions (Addendum)
OK to continue the gabapentin  OK to stop the xarelto  Please continue all other medications as before, and refills have been done if requested.  Please have the pharmacy call with any other refills you may need.  Please continue your efforts at being more active, low cholesterol diet, and weight control.  Please keep your appointments with your specialists as you may have planned  Please return in 3 months, or sooner if needed, with Lab testing done 3-5 days before

## 2014-08-20 NOTE — Assessment & Plan Note (Signed)
Improved, controlled,  to f/u any worsening symptoms or concerns

## 2014-08-30 ENCOUNTER — Telehealth: Payer: Self-pay | Admitting: Internal Medicine

## 2014-08-30 DIAGNOSIS — H538 Other visual disturbances: Secondary | ICD-10-CM

## 2014-08-30 NOTE — Telephone Encounter (Signed)
Referral done

## 2014-08-30 NOTE — Telephone Encounter (Signed)
Dawn from [facility] called wanting to know if a referral has been placed for patient to see a Dr. Doyce Loose. Opthalmology. Please call patient to let him know that the referral has been placed.

## 2014-09-02 NOTE — Telephone Encounter (Signed)
Notified pt with md response.../lmb 

## 2014-10-09 ENCOUNTER — Telehealth: Payer: Self-pay | Admitting: Internal Medicine

## 2014-10-09 DIAGNOSIS — M25579 Pain in unspecified ankle and joints of unspecified foot: Secondary | ICD-10-CM

## 2014-10-09 NOTE — Telephone Encounter (Signed)
Pt called in and needs a referral to [facility], he is having right for pain.  Panneuritis

## 2014-10-14 NOTE — Telephone Encounter (Signed)
referral has been done

## 2014-10-31 ENCOUNTER — Ambulatory Visit (INDEPENDENT_AMBULATORY_CARE_PROVIDER_SITE_OTHER): Payer: 59 | Admitting: Podiatry

## 2014-10-31 ENCOUNTER — Encounter: Payer: Self-pay | Admitting: Podiatry

## 2014-10-31 ENCOUNTER — Ambulatory Visit (INDEPENDENT_AMBULATORY_CARE_PROVIDER_SITE_OTHER): Payer: 59

## 2014-10-31 VITALS — BP 134/69 | HR 57 | Resp 15 | Ht 69.0 in | Wt 250.0 lb

## 2014-10-31 DIAGNOSIS — M7662 Achilles tendinitis, left leg: Secondary | ICD-10-CM | POA: Diagnosis not present

## 2014-10-31 DIAGNOSIS — L600 Ingrowing nail: Secondary | ICD-10-CM | POA: Diagnosis not present

## 2014-10-31 DIAGNOSIS — M7661 Achilles tendinitis, right leg: Secondary | ICD-10-CM | POA: Diagnosis not present

## 2014-10-31 DIAGNOSIS — M79673 Pain in unspecified foot: Secondary | ICD-10-CM

## 2014-10-31 MED ORDER — TRIAMCINOLONE ACETONIDE 10 MG/ML IJ SUSP
10.0000 mg | Freq: Once | INTRAMUSCULAR | Status: AC
  Administered 2014-10-31: 10 mg

## 2014-10-31 NOTE — Progress Notes (Signed)
   Subjective:    Patient ID: Joel King, male    DOB: 1951/01/12, 64 y.o.   MRN: [id number]  HPI Patient presents with foot pain in their right foot, heel of foot and back of heel. This has been going on for the past year. Pt stated, "painful to walk on sometimes". Pt has used epsom sale and blue emu, with some relief.   Review of Systems  Cardiovascular: Positive for leg swelling.  All other systems reviewed and are negative.      Objective:   Physical Exam        Assessment & Plan:

## 2014-10-31 NOTE — Patient Instructions (Signed)

## 2014-10-31 NOTE — Progress Notes (Signed)
Subjective:     Patient ID: Joel King, male   DOB: 1950/12/05, 64 y.o.   MRN: [id number]  HPI patient presents with pain in the posterior lateral aspect of the right heel and also a damaged thickened right hallux nail that's increasingly difficult for him to try   Review of Systems  All other systems reviewed and are negative.      Objective:   Physical Exam  Constitutional: He is oriented to person, place, and time.  Musculoskeletal: Normal range of motion.  Neurological: He is oriented to person, place, and time.  Skin: Skin is warm and dry.  Nursing note and vitals reviewed.  neurovascular status was intact muscle strength adequate range of motion within normal limits with mild equinus condition noted and quite a bit of discomfort right over left posterior heel at the insertional point of the tendon into the calcaneus Achilles with inflammation on the lateral side. Is found to have a severely thickened dystrophic damage right big toenail and patient is noted to be well oriented 3 and have good digital perfusion     Assessment:     Achilles tendinitis right lateral side and damaged hallux nail right with pain    Plan:     H&P and x-rays reviewed of patient and foot. At this time I did recommend a careful lateral injection explaining chances for rupture associated with it and patient is willing to accept risk. I injected the lateral side Achilles with 3 mg dexamethasone Kenalog 5 mg Xylocaine and applied ice and instructed on reduced activity and gradual physical therapy to start in around 2 weeks. For the nail I recommended removal and explained the surgery to patient and he wants to have this done understanding risk and today I infiltrated the right hallux 60 mg Xylocaine Marcaine mixture remove the hallux nail exposed matrix and applied phenol for applications 30 seconds followed by alcohol lavage and sterile dressing. Gave instructions on soaks and reappoint

## 2014-11-06 ENCOUNTER — Other Ambulatory Visit (INDEPENDENT_AMBULATORY_CARE_PROVIDER_SITE_OTHER): Payer: 59

## 2014-11-06 DIAGNOSIS — Z0189 Encounter for other specified special examinations: Secondary | ICD-10-CM

## 2014-11-06 DIAGNOSIS — Z0000 Encounter for general adult medical examination without abnormal findings: Secondary | ICD-10-CM

## 2014-11-06 LAB — LIPID PANEL
Cholesterol: 166 mg/dL (ref 0–200)
HDL: 32.6 mg/dL — ABNORMAL LOW (ref >39.00)
LDL Cholesterol: 105 mg/dL — ABNORMAL HIGH (ref 0–99)
NonHDL: 133.58
TRIGLYCERIDES: 144 mg/dL (ref 0.0–149.0)
Total CHOL/HDL Ratio: 5
VLDL: 28.8 mg/dL (ref 0.0–40.0)

## 2014-11-06 LAB — PSA: PSA: 2.24 ng/mL (ref 0.10–4.00)

## 2014-11-06 LAB — CBC WITH DIFFERENTIAL/PLATELET
BASOS PCT: 0.3 % (ref 0.0–3.0)
Basophils Absolute: 0 10*3/uL (ref 0.0–0.1)
EOS ABS: 0.2 10*3/uL (ref 0.0–0.7)
Eosinophils Relative: 2.1 % (ref 0.0–5.0)
HEMATOCRIT: 48.7 % (ref 39.0–52.0)
HEMOGLOBIN: 16.9 g/dL (ref 13.0–17.0)
LYMPHS ABS: 1.4 10*3/uL (ref 0.7–4.0)
LYMPHS PCT: 13 % (ref 12.0–46.0)
MCHC: 34.6 g/dL (ref 30.0–36.0)
MCV: 91.4 fl (ref 78.0–100.0)
Monocytes Absolute: 0.9 10*3/uL (ref 0.1–1.0)
Monocytes Relative: 8.5 % (ref 3.0–12.0)
NEUTROS ABS: 8.2 10*3/uL — AB (ref 1.4–7.7)
Neutrophils Relative %: 76.1 % (ref 43.0–77.0)
Platelets: 159 10*3/uL (ref 150.0–400.0)
RBC: 5.33 Mil/uL (ref 4.22–5.81)
RDW: 13.2 % (ref 11.5–15.5)
WBC: 10.8 10*3/uL — ABNORMAL HIGH (ref 4.0–10.5)

## 2014-11-06 LAB — BASIC METABOLIC PANEL
BUN: 19 mg/dL (ref 6–23)
CALCIUM: 9.9 mg/dL (ref 8.4–10.5)
CO2: 29 meq/L (ref 19–32)
CREATININE: 0.87 mg/dL (ref 0.40–1.50)
Chloride: 99 mEq/L (ref 96–112)
GFR: 93.78 mL/min (ref >60.00)
Glucose, Bld: 101 mg/dL — ABNORMAL HIGH (ref 70–99)
Potassium: 3.7 mEq/L (ref 3.5–5.1)
Sodium: 139 mEq/L (ref 135–145)

## 2014-11-06 LAB — URINALYSIS, ROUTINE W REFLEX MICROSCOPIC
BILIRUBIN URINE: NEGATIVE
HGB URINE DIPSTICK: NEGATIVE
Ketones, ur: NEGATIVE
Leukocytes, UA: NEGATIVE
NITRITE: NEGATIVE
Specific Gravity, Urine: 1.01 (ref 1.000–1.030)
URINE GLUCOSE: NEGATIVE
Urobilinogen, UA: 0.2 (ref 0.0–1.0)
pH: 7 (ref 5.0–8.0)

## 2014-11-06 LAB — HEPATIC FUNCTION PANEL
ALT: 17 U/L (ref 0–53)
AST: 15 U/L (ref 0–37)
Albumin: 4.5 g/dL (ref 3.5–5.2)
Alkaline Phosphatase: 66 U/L (ref 39–117)
BILIRUBIN DIRECT: 0.2 mg/dL (ref 0.0–0.3)
BILIRUBIN TOTAL: 0.9 mg/dL (ref 0.2–1.2)
Total Protein: 7.4 g/dL (ref 6.0–8.3)

## 2014-11-06 LAB — TSH: TSH: 1.92 u[IU]/mL (ref 0.35–4.50)

## 2014-11-13 ENCOUNTER — Ambulatory Visit (INDEPENDENT_AMBULATORY_CARE_PROVIDER_SITE_OTHER): Payer: 59 | Admitting: Internal Medicine

## 2014-11-13 ENCOUNTER — Encounter: Payer: Self-pay | Admitting: Internal Medicine

## 2014-11-13 VITALS — BP 130/82 | HR 60 | Temp 98.0°F | Ht 69.0 in | Wt 253.0 lb

## 2014-11-13 DIAGNOSIS — E119 Type 2 diabetes mellitus without complications: Secondary | ICD-10-CM

## 2014-11-13 DIAGNOSIS — Z0000 Encounter for general adult medical examination without abnormal findings: Secondary | ICD-10-CM

## 2014-11-13 NOTE — Progress Notes (Signed)
Pre visit review using our clinic review tool, if applicable. No additional management support is needed unless otherwise documented below in the visit note. 

## 2014-11-13 NOTE — Assessment & Plan Note (Addendum)

## 2014-11-13 NOTE — Progress Notes (Signed)
Subjective:    Patient ID: Joel King, male    DOB: June 19, 1950, 64 y.o.   MRN: [id number]  HPI  Here for wellness and f/u;  Overall doing ok;  Pt denies Chest pain, worsening SOB, DOE, wheezing, orthopnea, PND, worsening LE edema, palpitations, dizziness or syncope.  Pt denies neurological change such as new headache, facial or extremity weakness.  Pt denies polydipsia, polyuria, or low sugar symptoms. Pt states overall good compliance with treatment and medications, good tolerability, and has been trying to follow appropriate diet.  Pt denies worsening depressive symptoms, suicidal ideation or panic. No fever, night sweats, wt loss, loss of appetite, or other constitutional symptoms.  Pt states good ability with ADL's, has low fall risk, home safety reviewed and adequate, no other significant changes in hearing or vision, and only occasionally active with exercise.  BPs at home controlled.  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 No current complaints  Recent steroid shot for heel neuritis has helped Past Medical History  Diagnosis Date  . ANXIETY 11/21/2006  . DEPRESSION 05/31/2008  . DIABETES MELLITUS, TYPE II 07/22/2007  . Diarrhea 05/31/2008  . HYPERLIPIDEMIA 11/21/2006  . HYPERTENSION 11/17/2006  . INSOMNIA-SLEEP DISORDER-UNSPEC 07/21/2007  . OBESITY, MILD 11/17/2006  . PEPTIC ULCER DISEASE, HX OF 11/17/2006  . PEPTIC ULCER DISEASE 07/22/2007  . SINUSITIS- ACUTE-NOS 12/08/2007  . SKIN LESION 12/08/2007  . DVT (deep venous thrombosis) 02/08/2014   Past Surgical History  Procedure Laterality Date  . Inguinal herniorrhapy      bilat  . S/p incarcerated recurrent ventral hernia    . S/p meckel's diverticulum      reports that he has never smoked. He does not have any smokeless tobacco history on file. He reports that he does not drink alcohol. His drug history is not on file. family history includes Cancer in his other; Stroke in his other. Allergies    Allergen Reactions  . Atorvastatin   . Pravastatin Sodium   . Rosuvastatin    Current Outpatient Prescriptions on File Prior to Visit  Medication Sig Dispense Refill  . amLODipine (NORVASC) 10 MG tablet take 1 tablet by mouth once daily 90 tablet 3  . aspirin 81 MG tablet Take 81 mg by mouth daily.      Marland Kitchen atenolol-chlorthalidone (TENORETIC) 100-25 MG per tablet take 1 tablet by mouth once daily 90 tablet 3  . benazepril (LOTENSIN) 40 MG tablet take 1 tablet by mouth once daily 90 tablet 3  . gabapentin (NEURONTIN) 600 MG tablet Take 1 tablet (600 mg total) by mouth at bedtime. 30 tablet 5  . potassium chloride (KLOR-CON M10) 10 MEQ tablet take 1 tablet by mouth once daily 90 tablet 3  . simvastatin (ZOCOR) 20 MG tablet take 1 tablet by mouth once daily 90 tablet 3   No current facility-administered medications on file prior to visit.   Review of Systems Constitutional: Negative for increased diaphoresis, other activity, appetite or siginficant weight change other than noted HENT: Negative for worsening hearing loss, ear pain, facial swelling, mouth sores and neck stiffness.   Eyes: Negative for other worsening pain, redness or visual disturbance.  Respiratory: Negative for shortness of breath and wheezing  Cardiovascular: Negative for chest pain and palpitations.  Gastrointestinal: Negative for diarrhea, blood in stool, abdominal distention or other pain Genitourinary: Negative for hematuria, flank pain or change in urine volume.  Musculoskeletal: Negative for myalgias or other joint complaints.  Skin: Negative for color  change and wound or drainage.  Neurological: Negative for syncope and numbness. other than noted Hematological: Negative for adenopathy. or other swelling Psychiatric/Behavioral: Negative for hallucinations, SI, self-injury, decreased concentration or other worsening agitation.      Objective:   Physical Exam BP 130/82 mmHg  Pulse 60  Temp(Src) 98 F (36.7 C)  (Oral)  Ht 5\' 9"  (1.753 m)  Wt 253 lb (114.76 kg)  BMI 37.34 kg/m2  SpO2 98% VS noted,  Constitutional: Pt is oriented to person, place, and time. Appears well-developed and well-nourished, in no significant distress Head: Normocephalic and atraumatic.  Right Ear: External ear normal.  Left Ear: External ear normal.  Nose: Nose normal.  Mouth/Throat: Oropharynx is clear and moist.  Eyes: Conjunctivae and EOM are normal. Pupils are equal, round, and reactive to light.  Neck: Normal range of motion. Neck supple. No JVD present. No tracheal deviation present or significant neck LA or mass Cardiovascular: Normal rate, regular rhythm, normal heart sounds and intact distal pulses.   Pulmonary/Chest: Effort normal and breath sounds without rales or wheezing  Abdominal: Soft. Bowel sounds are normal. NT. No HSM  Musculoskeletal: Normal range of motion. Exhibits no edema.  Lymphadenopathy:  Has no cervical adenopathy.  Neurological: Pt is alert and oriented to person, place, and time. Pt has normal reflexes. No cranial nerve deficit. Motor grossly intact Skin: Skin is warm and dry. No rash noted.  Psychiatric:  Has normal mood and affect. Behavior is normal.     Assessment & Plan:

## 2014-11-13 NOTE — Patient Instructions (Addendum)
Your EKG was [facility] today  Please continue all other medications as before, and refills have been done if requested.  Please have the pharmacy call with any other refills you may need.  Please continue your efforts at being more active, low cholesterol diet, and weight control.  You are otherwise up to date with prevention measures today.  Please keep your appointments with your specialists as you may have planned  Please return in 6 months, or sooner if needed, with Lab testing done 3-5 days before

## 2015-01-14 ENCOUNTER — Encounter: Payer: Self-pay | Admitting: Internal Medicine

## 2015-01-14 ENCOUNTER — Ambulatory Visit (INDEPENDENT_AMBULATORY_CARE_PROVIDER_SITE_OTHER): Payer: 59 | Admitting: Internal Medicine

## 2015-01-14 VITALS — BP 126/82 | HR 64 | Temp 97.5°F | Ht 66.0 in | Wt 246.0 lb

## 2015-01-14 DIAGNOSIS — E119 Type 2 diabetes mellitus without complications: Secondary | ICD-10-CM

## 2015-01-14 DIAGNOSIS — J069 Acute upper respiratory infection, unspecified: Secondary | ICD-10-CM | POA: Insufficient documentation

## 2015-01-14 DIAGNOSIS — I10 Essential (primary) hypertension: Secondary | ICD-10-CM | POA: Diagnosis not present

## 2015-01-14 MED ORDER — AZITHROMYCIN 250 MG PO TABS: ORAL_TABLET | ORAL | Status: DC

## 2015-01-14 MED ORDER — AZITHROMYCIN 250 MG PO TABS
ORAL_TABLET | ORAL | Status: DC
Start: 2015-01-14 — End: 2015-01-14

## 2015-01-14 MED ORDER — HYDROCODONE-HOMATROPINE 5-1.5 MG/5ML PO SYRP: 5.0000 mL | ORAL_SOLUTION | Freq: Four times a day (QID) | ORAL | Status: DC | PRN

## 2015-01-14 NOTE — Patient Instructions (Signed)
Please take all new medication as prescribed - the antibiotic and cough medicine  Please continue all other medications as before, and refills have been done if requested.  Please have the pharmacy call with any other refills you may need.  Please keep your appointments with your specialists as you may have planned

## 2015-01-14 NOTE — Assessment & Plan Note (Signed)
Mild to mod, for antibx course,  to f/u any worsening symptoms or concerns 

## 2015-01-14 NOTE — Progress Notes (Signed)
Subjective:    Patient ID: Joel King, male    DOB: 02-18-1951, 64 y.o.   MRN: [id number]  HPI  Here with 2-3 days acute onset fever, facial pain, pressure, headache, general weakness and malaise, and greenish d/c, with mild ST and cough, but pt denies chest pain, wheezing, increased sob or doe, orthopnea, PND, increased LE swelling, palpitations, dizziness or syncope. Pt denies new neurological symptoms such as new headache, or facial or extremity weakness or numbness   Pt denies polydipsia, polyuria. Past Medical History  Diagnosis Date  . ANXIETY 11/21/2006  . DEPRESSION 05/31/2008  . DIABETES MELLITUS, TYPE II 07/22/2007  . Diarrhea 05/31/2008  . HYPERLIPIDEMIA 11/21/2006  . HYPERTENSION 11/17/2006  . INSOMNIA-SLEEP DISORDER-UNSPEC 07/21/2007  . OBESITY, MILD 11/17/2006  . PEPTIC ULCER DISEASE, HX OF 11/17/2006  . PEPTIC ULCER DISEASE 07/22/2007  . SINUSITIS- ACUTE-NOS 12/08/2007  . SKIN LESION 12/08/2007  . DVT (deep venous thrombosis) ([place]) 02/08/2014   Past Surgical History  Procedure Laterality Date  . Inguinal herniorrhapy      bilat  . S/p incarcerated recurrent ventral hernia    . S/p meckel's diverticulum      reports that he has never smoked. He does not have any smokeless tobacco history on file. He reports that he does not drink alcohol. His drug history is not on file. family history includes Cancer in his other; Stroke in his other. Allergies  Allergen Reactions  . Atorvastatin   . Pravastatin Sodium   . Rosuvastatin    Current Outpatient Prescriptions on File Prior to Visit  Medication Sig Dispense Refill  . amLODipine (NORVASC) 10 MG tablet take 1 tablet by mouth once daily 90 tablet 3  . aspirin 81 MG tablet Take 81 mg by mouth daily.      Marland Kitchen atenolol-chlorthalidone (TENORETIC) 100-25 MG per tablet take 1 tablet by mouth once daily 90 tablet 3  . benazepril (LOTENSIN) 40 MG tablet take 1 tablet by mouth once daily 90 tablet 3  . gabapentin (NEURONTIN) 600 MG  tablet Take 1 tablet (600 mg total) by mouth at bedtime. 30 tablet 5  . potassium chloride (KLOR-CON M10) 10 MEQ tablet take 1 tablet by mouth once daily 90 tablet 3  . simvastatin (ZOCOR) 20 MG tablet take 1 tablet by mouth once daily 90 tablet 3   No current facility-administered medications on file prior to visit.   Review of Systems  Constitutional: Negative for unusual diaphoresis or night sweats HENT: Negative for ringing in ear or discharge Eyes: Negative for double vision or worsening visual disturbance.  Respiratory: Negative for choking and stridor.   Gastrointestinal: Negative for vomiting or other signifcant bowel change Genitourinary: Negative for hematuria or change in urine volume.  Musculoskeletal: Negative for other MSK pain or swelling Skin: Negative for color change and worsening wound.  Neurological: Negative for tremors and numbness other than noted  Psychiatric/Behavioral: Negative for decreased concentration or agitation other than above       Objective:   Physical Exam BP 126/82 mmHg  Pulse 64  Temp(Src) 97.5 F (36.4 C) (Oral)  Ht 5\' 6"  (1.676 m)  Wt 246 lb (111.585 kg)  BMI 39.72 kg/m2  SpO2 96% VS noted, mild ill  Constitutional: Pt appears in no significant distress HENT: Head: NCAT.  Right Ear: External ear normal.  Left Ear: External ear normal.  Eyes: . Pupils are equal, round, and reactive to light. Conjunctivae and EOM are normal Bilat tm's with mild erythema.  Max sinus areas mild tender.  Pharynx with mild erythema, no exudate Neck: Normal range of motion. Neck supple.  Cardiovascular: Normal rate and regular rhythm.   Pulmonary/Chest: Effort normal and breath sounds without rales or wheezing.  Neurological: Pt is alert. Not confused , motor grossly intact Skin: Skin is warm. No rash, no LE edema Psychiatric: Pt behavior is normal. No agitation.     Assessment & Plan:

## 2015-01-14 NOTE — Progress Notes (Signed)
Pre visit review using our clinic review tool, if applicable. No additional management support is needed unless otherwise documented below in the visit note. 

## 2015-01-14 NOTE — Assessment & Plan Note (Signed)
stable overall by history and exam, recent data reviewed with pt, and pt to continue medical treatment as before,  to f/u any worsening symptoms or concerns BP Readings from Last 3 Encounters:  01/14/15 126/82  11/13/14 130/82  10/31/14 134/69

## 2015-01-14 NOTE — Assessment & Plan Note (Signed)
stable overall by history and exam, recent data reviewed with pt, and pt to continue medical treatment as before,  to f/u any worsening symptoms or concerns Lab Results  Component Value Date   HGBA1C 5.8 10/03/2012

## 2015-02-21 ENCOUNTER — Other Ambulatory Visit: Payer: Self-pay | Admitting: Internal Medicine

## 2015-02-26 ENCOUNTER — Other Ambulatory Visit: Payer: Self-pay | Admitting: Internal Medicine

## 2015-03-17 ENCOUNTER — Other Ambulatory Visit (INDEPENDENT_AMBULATORY_CARE_PROVIDER_SITE_OTHER): Payer: 59

## 2015-03-17 DIAGNOSIS — E119 Type 2 diabetes mellitus without complications: Secondary | ICD-10-CM | POA: Diagnosis not present

## 2015-03-17 LAB — HEMOGLOBIN A1C: HEMOGLOBIN A1C: 5.4 % (ref 4.6–6.5)

## 2015-03-17 LAB — LIPID PANEL
CHOLESTEROL: 173 mg/dL (ref 0–200)
HDL: 32.8 mg/dL — ABNORMAL LOW (ref >39.00)
LDL Cholesterol: 106 mg/dL — ABNORMAL HIGH (ref 0–99)
NONHDL: 140.6
Total CHOL/HDL Ratio: 5
Triglycerides: 173 mg/dL — ABNORMAL HIGH (ref 0.0–149.0)
VLDL: 34.6 mg/dL (ref 0.0–40.0)

## 2015-03-17 LAB — BASIC METABOLIC PANEL
BUN: 29 mg/dL — ABNORMAL HIGH (ref 6–23)
CALCIUM: 9.8 mg/dL (ref 8.4–10.5)
CO2: 30 meq/L (ref 19–32)
Chloride: 97 mEq/L (ref 96–112)
Creatinine, Ser: 1.08 mg/dL (ref 0.40–1.50)
GFR: 72.99 mL/min (ref >60.00)
GLUCOSE: 98 mg/dL (ref 70–99)
POTASSIUM: 3.1 meq/L — AB (ref 3.5–5.1)
SODIUM: 137 meq/L (ref 135–145)

## 2015-03-17 LAB — HEPATIC FUNCTION PANEL
ALBUMIN: 4.3 g/dL (ref 3.5–5.2)
ALK PHOS: 62 U/L (ref 39–117)
ALT: 39 U/L (ref 0–53)
AST: 122 U/L — AB (ref 0–37)
BILIRUBIN DIRECT: 0.2 mg/dL (ref 0.0–0.3)
TOTAL PROTEIN: 7.6 g/dL (ref 6.0–8.3)
Total Bilirubin: 1.5 mg/dL — ABNORMAL HIGH (ref 0.2–1.2)

## 2015-03-19 ENCOUNTER — Telehealth: Payer: Self-pay | Admitting: Internal Medicine

## 2015-03-19 NOTE — Telephone Encounter (Signed)
Labs dec 19 ok, except for mild low k  Ok to keep appt to address the K and any other issues

## 2015-03-19 NOTE — Telephone Encounter (Signed)
Pt request lab result that was done on 03/17/15. Pt also wondering if he need to come in to see Dr. Jenny Reichmann on 03/25/15? Please give him a call back   Phone # [phone]----ok to leave detail massage on here if no answer

## 2015-03-19 NOTE — Telephone Encounter (Signed)
Left detail massage for pt.  

## 2015-03-25 ENCOUNTER — Ambulatory Visit (INDEPENDENT_AMBULATORY_CARE_PROVIDER_SITE_OTHER): Payer: 59 | Admitting: Internal Medicine

## 2015-03-25 ENCOUNTER — Encounter: Payer: Self-pay | Admitting: Internal Medicine

## 2015-03-25 VITALS — BP 116/74 | HR 63 | Temp 98.2°F | Ht 66.0 in | Wt 247.0 lb

## 2015-03-25 DIAGNOSIS — Z0000 Encounter for general adult medical examination without abnormal findings: Secondary | ICD-10-CM

## 2015-03-25 DIAGNOSIS — K7689 Other specified diseases of liver: Secondary | ICD-10-CM

## 2015-03-25 DIAGNOSIS — R945 Abnormal results of liver function studies: Secondary | ICD-10-CM | POA: Insufficient documentation

## 2015-03-25 DIAGNOSIS — R42 Dizziness and giddiness: Secondary | ICD-10-CM | POA: Diagnosis not present

## 2015-03-25 DIAGNOSIS — E119 Type 2 diabetes mellitus without complications: Secondary | ICD-10-CM

## 2015-03-25 DIAGNOSIS — L989 Disorder of the skin and subcutaneous tissue, unspecified: Secondary | ICD-10-CM

## 2015-03-25 DIAGNOSIS — Z0189 Encounter for other specified special examinations: Secondary | ICD-10-CM

## 2015-03-25 DIAGNOSIS — E876 Hypokalemia: Secondary | ICD-10-CM | POA: Insufficient documentation

## 2015-03-25 MED ORDER — POTASSIUM CHLORIDE ER 10 MEQ PO TBCR: EXTENDED_RELEASE_TABLET | ORAL | Status: DC

## 2015-03-25 MED ORDER — MECLIZINE HCL 12.5 MG PO TABS: 12.5000 mg | ORAL_TABLET | Freq: Three times a day (TID) | ORAL | Status: DC | PRN

## 2015-03-25 NOTE — Assessment & Plan Note (Signed)
?   [facility] ca, declines derm referral for now due to high deductible insurance

## 2015-03-25 NOTE — Assessment & Plan Note (Signed)
stable overall by history and exam, recent data reviewed with pt, and pt to continue medical treatment as before,  to f/u any worsening symptoms or concerns Lab Results  Component Value Date   HGBA1C 5.4 03/17/2015

## 2015-03-25 NOTE — Assessment & Plan Note (Signed)
Etiology unclear, exam benign, for prn meclizine in case occurs again

## 2015-03-25 NOTE — Progress Notes (Signed)
Subjective:    Patient ID: Joel King, male    DOB: 1950/07/01, 64 y.o.   MRN: [id number]  HPI  Here to f/u; overall doing ok,  Pt denies chest pain, increasing sob or doe, wheezing, orthopnea, PND, increased LE swelling, palpitations, or syncope.  Pt denies new neurological symptoms such as new headache, or facial or extremity weakness or numbness.  Pt denies polydipsia, polyuria, or low sugar episode.   Pt denies new neurological symptoms such as new headache, or facial or extremity weakness or numbness.   Pt states overall good compliance with meds, mostly trying to follow appropriate diet, with wt overall stable,  but little exercise however. Denies worsening reflux, abd pain, dysphagia, n/v, bowel change or blood.  Does have a small skin lesion left face not healing for several wks, and thinks might need derm.  Had an episode of brief dizziness for a few seconds yesterday, has not happened prior or since. Past Medical History  Diagnosis Date  . ANXIETY 11/21/2006  . DEPRESSION 05/31/2008  . DIABETES MELLITUS, TYPE II 07/22/2007  . Diarrhea 05/31/2008  . HYPERLIPIDEMIA 11/21/2006  . HYPERTENSION 11/17/2006  . INSOMNIA-SLEEP DISORDER-UNSPEC 07/21/2007  . OBESITY, MILD 11/17/2006  . PEPTIC ULCER DISEASE, HX OF 11/17/2006  . PEPTIC ULCER DISEASE 07/22/2007  . SINUSITIS- ACUTE-NOS 12/08/2007  . SKIN LESION 12/08/2007  . DVT (deep venous thrombosis) ([place]) 02/08/2014   Past Surgical History  Procedure Laterality Date  . Inguinal herniorrhapy      bilat  . S/p incarcerated recurrent ventral hernia    . S/p meckel's diverticulum      reports that he has never smoked. He does not have any smokeless tobacco history on file. He reports that he does not drink alcohol. His drug history is not on file. family history includes Cancer in his other; Stroke in his other. Allergies  Allergen Reactions  . Atorvastatin   . Pravastatin Sodium   . Rosuvastatin    Current Outpatient Prescriptions on File  Prior to Visit  Medication Sig Dispense Refill  . amLODipine (NORVASC) 10 MG tablet take 1 tablet by mouth once daily 90 tablet 3  . aspirin 81 MG tablet Take 81 mg by mouth daily.      Marland Kitchen atenolol-chlorthalidone (TENORETIC) 100-25 MG per tablet take 1 tablet by mouth once daily 90 tablet 3  . benazepril (LOTENSIN) 40 MG tablet TAKE 1 TABLET BY MOUTH ONCE DAILY 90 tablet 2  . gabapentin (NEURONTIN) 600 MG tablet Take 1 tablet (600 mg total) by mouth at bedtime. 30 tablet 5  . gabapentin (NEURONTIN) 600 MG tablet TAKE 1 TABLET (600 MG TOTAL) BY MOUTH AT BEDTIME. 30 tablet 2  . potassium chloride (KLOR-CON M10) 10 MEQ tablet take 1 tablet by mouth once daily 90 tablet 3  . simvastatin (ZOCOR) 20 MG tablet take 1 tablet by mouth once daily 90 tablet 3  . azithromycin (ZITHROMAX Z-PAK) 250 MG tablet Use as directed (Patient not taking: Reported on 03/25/2015) 6 tablet 1  . HYDROcodone-homatropine (HYCODAN) 5-1.5 MG/5ML syrup Take 5 mLs by mouth every 6 (six) hours as needed for cough. (Patient not taking: Reported on 03/25/2015) 180 mL 0  . ZOSTAVAX 91478 UNT/0.65ML injection Reported on 03/25/2015  0   No current facility-administered medications on file prior to visit.    Review of Systems  Constitutional: Negative for unusual diaphoresis or night sweats HENT: Negative for ringing in ear or discharge Eyes: Negative for double vision or worsening visual disturbance.  Respiratory: Negative for choking and stridor.   Gastrointestinal: Negative for vomiting or other signifcant bowel change Genitourinary: Negative for hematuria or change in urine volume.  Musculoskeletal: Negative for other MSK pain or swelling Skin: Negative for color change and worsening wound.  Neurological: Negative for tremors and numbness other than noted  Psychiatric/Behavioral: Negative for decreased concentration or agitation other than above       Objective:   Physical Exam BP 116/74 mmHg  Pulse 63  Temp(Src) 98.2  F (36.8 C) (Oral)  Ht 5\' 6"  (1.676 m)  Wt 247 lb (112.038 kg)  BMI 39.89 kg/m2  SpO2 98% VS noted,  Constitutional: Pt appears in no significant distress HENT: Head: NCAT.  Right Ear: External ear normal.  Left Ear: External ear normal.  Eyes: . Pupils are equal, round, and reactive to light. Conjunctivae and EOM are normal Neck: Normal range of motion. Neck supple.  Cardiovascular: Normal rate and regular rhythm.   Pulmonary/Chest: Effort normal and breath sounds without rales or wheezing.  Abd:  Soft, NT, ND, + BS Neurological: Pt is alert. Not confused , motor grossly intact Skin: Skin is warm. No rash, no LE edema Psychiatric: Pt behavior is normal. No agitation.     Assessment & Plan:

## 2015-03-25 NOTE — Patient Instructions (Addendum)
OK to increase the potassium to 2 pills per day  Please take all new medication as prescribed - the meclizine for dizziness if needed  Please continue all other medications as before, and refills have been done if requested.  Please have the pharmacy call with any other refills you may need.  Please continue your efforts at being more active, low cholesterol diet, and weight control.  You are otherwise up to date with prevention measures today.  Please keep your appointments with your specialists as you may have planned  Please return in 6 months, or sooner if needed, with Lab testing done 3-5 days before only if you do NOT have traditional medicare insurance as your primary insurance

## 2015-03-25 NOTE — Progress Notes (Signed)
Pre visit review using our clinic review tool, if applicable. No additional management support is needed unless otherwise documented below in the visit note. 

## 2015-03-25 NOTE — Assessment & Plan Note (Signed)
Taking kdur 10 qd though not on med list, to increase to 20qd, f/u labs next visit

## 2015-03-25 NOTE — Assessment & Plan Note (Signed)
Lab Results  Component Value Date   WBC 10.8* 11/06/2014   HGB 16.9 11/06/2014   HCT 48.7 11/06/2014   PLT 159.0 11/06/2014   GLUCOSE 98 03/17/2015   CHOL 173 03/17/2015   TRIG 173.0* 03/17/2015   HDL 32.80* 03/17/2015   LDLDIRECT 128.5 10/03/2012   LDLCALC 106* 03/17/2015   ALT 39 03/17/2015   AST 122* 03/17/2015   NA 137 03/17/2015   K 3.1* 03/17/2015   CL 97 03/17/2015   CREATININE 1.08 03/17/2015   BUN 29* 03/17/2015   CO2 30 03/17/2015   TSH 1.92 11/06/2014   PSA 2.24 11/06/2014   HGBA1C 5.4 03/17/2015   MICROALBUR 9.8* 10/03/2012   Elevated alt - etiology unclear, declines repeat or u/s at this time due to cost concerns

## 2015-04-21 ENCOUNTER — Encounter: Payer: Self-pay | Admitting: Internal Medicine

## 2015-05-21 ENCOUNTER — Telehealth: Payer: Self-pay | Admitting: Internal Medicine

## 2015-05-21 ENCOUNTER — Other Ambulatory Visit: Payer: Self-pay

## 2015-05-21 MED ORDER — BENAZEPRIL HCL 40 MG PO TABS: 40.0000 mg | ORAL_TABLET | Freq: Every day | ORAL | Status: DC

## 2015-05-21 MED ORDER — SIMVASTATIN 20 MG PO TABS: ORAL_TABLET | ORAL | Status: DC

## 2015-05-21 MED ORDER — POTASSIUM CHLORIDE CRYS ER 10 MEQ PO TBCR: EXTENDED_RELEASE_TABLET | ORAL | Status: DC

## 2015-05-21 MED ORDER — AMLODIPINE BESYLATE 10 MG PO TABS: ORAL_TABLET | ORAL | Status: DC

## 2015-05-21 MED ORDER — ATENOLOL-CHLORTHALIDONE 100-25 MG PO TABS: ORAL_TABLET | ORAL | Status: DC

## 2015-05-21 MED ORDER — GABAPENTIN 600 MG PO TABS: 600.0000 mg | ORAL_TABLET | Freq: Every day | ORAL | Status: DC

## 2015-05-21 NOTE — Telephone Encounter (Signed)
Patient walked in to request that we send the following scripts [facility]...  Gabapentin (wants changed to a 90 day supply when we send)  Potassium chloride Benazepril HCL  Simvastatin  Amlodipine besylate Atenolol   Fax # [phone]   He requests a full years supply on all medications if possible.  Patient has new cov'g  RX Bin # [id number] RX PCN# [id number] RX Grp# [id number]   Patient's coverage does not start until 05/28/2015. They approved [place] to go ahead and send the scripts in, and they will pend the fill until the date that his covg begins

## 2015-05-30 ENCOUNTER — Other Ambulatory Visit: Payer: Self-pay | Admitting: *Deleted

## 2015-05-30 MED ORDER — AMLODIPINE BESYLATE 10 MG PO TABS: ORAL_TABLET | ORAL | Status: DC

## 2015-05-30 MED ORDER — SIMVASTATIN 20 MG PO TABS: ORAL_TABLET | ORAL | Status: DC

## 2015-05-30 MED ORDER — GABAPENTIN 600 MG PO TABS: 600.0000 mg | ORAL_TABLET | Freq: Every day | ORAL | Status: DC

## 2015-06-02 ENCOUNTER — Telehealth: Payer: Self-pay

## 2015-06-02 MED ORDER — ATENOLOL-CHLORTHALIDONE 100-25 MG PO TABS: ORAL_TABLET | ORAL | Status: DC

## 2015-06-02 MED ORDER — BENAZEPRIL HCL 40 MG PO TABS: 40.0000 mg | ORAL_TABLET | Freq: Every day | ORAL | Status: DC

## 2015-06-02 MED ORDER — POTASSIUM CHLORIDE CRYS ER 10 MEQ PO TBCR: EXTENDED_RELEASE_TABLET | ORAL | Status: DC

## 2015-06-02 NOTE — Telephone Encounter (Signed)
Medication has been sent to [facility]

## 2015-06-04 ENCOUNTER — Telehealth: Payer: Self-pay | Admitting: Internal Medicine

## 2015-06-04 NOTE — Telephone Encounter (Signed)
Patient walked in. Regarding klorcon. The sig was changed on the 12/27/20116 visit to 2 tabs per day the order that was sent in last week was for 90 @ 1 per day.   He has already received this script. Is it possible to just send another 90 count to match up to this one so he doesn't have to return this script?   Moving forward after this one, we could change back to 2 per day @ 180 count.   [facility] order # [id number] Account # [id number]  ++++++++++++++++++++++++++++++++++++++++++++++++++++++++++++++++++ amLODipine (NORVASC) 10 MG tablet [id number]  gabapentin (NEURONTIN) 600 MG tablet [id number]  simvastatin (ZOCOR) 20 MG tablet [id number]   Additionally, the above medications were only sent in with 1 refill rather than the 3 refills that the original order was sent in for. i did not see a notated reason for this. Is it possible to change that to 3 refills?

## 2015-06-05 ENCOUNTER — Other Ambulatory Visit: Payer: Self-pay

## 2015-06-05 MED ORDER — POTASSIUM CHLORIDE ER 10 MEQ PO TBCR: EXTENDED_RELEASE_TABLET | ORAL | Status: DC

## 2015-07-03 DIAGNOSIS — M25562 Pain in left knee: Secondary | ICD-10-CM | POA: Diagnosis not present

## 2015-07-03 DIAGNOSIS — M25561 Pain in right knee: Secondary | ICD-10-CM | POA: Diagnosis not present

## 2015-07-17 DIAGNOSIS — M25561 Pain in right knee: Secondary | ICD-10-CM | POA: Diagnosis not present

## 2015-08-08 DIAGNOSIS — M1711 Unilateral primary osteoarthritis, right knee: Secondary | ICD-10-CM | POA: Diagnosis not present

## 2015-08-08 DIAGNOSIS — I87323 Chronic venous hypertension (idiopathic) with inflammation of bilateral lower extremity: Secondary | ICD-10-CM | POA: Diagnosis not present

## 2015-09-05 DIAGNOSIS — I87323 Chronic venous hypertension (idiopathic) with inflammation of bilateral lower extremity: Secondary | ICD-10-CM | POA: Diagnosis not present

## 2015-09-05 DIAGNOSIS — M1711 Unilateral primary osteoarthritis, right knee: Secondary | ICD-10-CM | POA: Diagnosis not present

## 2015-09-07 ENCOUNTER — Other Ambulatory Visit: Payer: Self-pay | Admitting: Internal Medicine

## 2015-09-08 NOTE — Telephone Encounter (Signed)
Patient wanted to make sure that this came in for a qty 90 each. He is aslo requesting refills on each med.

## 2015-10-15 ENCOUNTER — Telehealth: Payer: Self-pay | Admitting: Internal Medicine

## 2015-10-15 DIAGNOSIS — E079 Disorder of thyroid, unspecified: Secondary | ICD-10-CM

## 2015-10-15 DIAGNOSIS — I10 Essential (primary) hypertension: Secondary | ICD-10-CM

## 2015-10-15 DIAGNOSIS — Z125 Encounter for screening for malignant neoplasm of prostate: Secondary | ICD-10-CM

## 2015-10-15 DIAGNOSIS — E785 Hyperlipidemia, unspecified: Secondary | ICD-10-CM

## 2015-10-15 DIAGNOSIS — E089 Diabetes mellitus due to underlying condition without complications: Secondary | ICD-10-CM

## 2015-10-15 NOTE — Telephone Encounter (Signed)
Patient has [place].  Patient would like to do labs before CPE.  Would like to know if this can be done?

## 2015-10-15 NOTE — Telephone Encounter (Signed)
Orders placed for CPE? Patient advised that he may or may not have to pay for this out of pocket.

## 2015-11-20 DIAGNOSIS — I872 Venous insufficiency (chronic) (peripheral): Secondary | ICD-10-CM | POA: Diagnosis not present

## 2015-11-24 ENCOUNTER — Other Ambulatory Visit: Payer: Self-pay | Admitting: Internal Medicine

## 2015-12-09 ENCOUNTER — Encounter: Payer: Self-pay | Admitting: Internal Medicine

## 2015-12-09 ENCOUNTER — Other Ambulatory Visit (INDEPENDENT_AMBULATORY_CARE_PROVIDER_SITE_OTHER): Payer: Medicare Other

## 2015-12-09 ENCOUNTER — Ambulatory Visit (INDEPENDENT_AMBULATORY_CARE_PROVIDER_SITE_OTHER): Payer: Medicare Other | Admitting: Internal Medicine

## 2015-12-09 VITALS — BP 136/72 | HR 63 | Temp 98.0°F | Resp 20 | Wt 250.0 lb

## 2015-12-09 DIAGNOSIS — Z0001 Encounter for general adult medical examination with abnormal findings: Secondary | ICD-10-CM | POA: Diagnosis not present

## 2015-12-09 DIAGNOSIS — R6889 Other general symptoms and signs: Secondary | ICD-10-CM

## 2015-12-09 DIAGNOSIS — Z23 Encounter for immunization: Secondary | ICD-10-CM

## 2015-12-09 DIAGNOSIS — I10 Essential (primary) hypertension: Secondary | ICD-10-CM

## 2015-12-09 DIAGNOSIS — F411 Generalized anxiety disorder: Secondary | ICD-10-CM

## 2015-12-09 DIAGNOSIS — E119 Type 2 diabetes mellitus without complications: Secondary | ICD-10-CM

## 2015-12-09 DIAGNOSIS — L299 Pruritus, unspecified: Secondary | ICD-10-CM

## 2015-12-09 DIAGNOSIS — E785 Hyperlipidemia, unspecified: Secondary | ICD-10-CM

## 2015-12-09 LAB — URINALYSIS, ROUTINE W REFLEX MICROSCOPIC
BILIRUBIN URINE: NEGATIVE
Hgb urine dipstick: NEGATIVE
Ketones, ur: NEGATIVE
LEUKOCYTES UA: NEGATIVE
Nitrite: NEGATIVE
PH: 8 (ref 5.0–8.0)
RBC / HPF: NONE SEEN (ref 0–?)
SPECIFIC GRAVITY, URINE: 1.015 (ref 1.000–1.030)
Total Protein, Urine: NEGATIVE
Urine Glucose: NEGATIVE
Urobilinogen, UA: 0.2 (ref 0.0–1.0)

## 2015-12-09 LAB — CBC WITH DIFFERENTIAL/PLATELET
BASOS ABS: 0 10*3/uL (ref 0.0–0.1)
Basophils Relative: 0 % (ref 0.0–3.0)
EOS PCT: 12.6 % — AB (ref 0.0–5.0)
Eosinophils Absolute: 1.7 10*3/uL — ABNORMAL HIGH (ref 0.0–0.7)
HCT: 48.8 % (ref 39.0–52.0)
Hemoglobin: 17.1 g/dL — ABNORMAL HIGH (ref 13.0–17.0)
LYMPHS ABS: 1.2 10*3/uL (ref 0.7–4.0)
Lymphocytes Relative: 8.9 % — ABNORMAL LOW (ref 12.0–46.0)
MCHC: 35 g/dL (ref 30.0–36.0)
MCV: 92.8 fl (ref 78.0–100.0)
MONO ABS: 1 10*3/uL (ref 0.1–1.0)
Monocytes Relative: 7.5 % (ref 3.0–12.0)
NEUTROS ABS: 9.7 10*3/uL — AB (ref 1.4–7.7)
NEUTROS PCT: 71 % (ref 43.0–77.0)
PLATELETS: 249 10*3/uL (ref 150.0–400.0)
RBC: 5.26 Mil/uL (ref 4.22–5.81)
RDW: 12.5 % (ref 11.5–15.5)
WBC: 13.6 10*3/uL — ABNORMAL HIGH (ref 4.0–10.5)

## 2015-12-09 LAB — BASIC METABOLIC PANEL
BUN: 14 mg/dL (ref 6–23)
CO2: 32 mEq/L (ref 19–32)
Calcium: 9.9 mg/dL (ref 8.4–10.5)
Chloride: 100 mEq/L (ref 96–112)
Creatinine, Ser: 0.84 mg/dL (ref 0.40–1.50)
GFR: 97.32 mL/min (ref >60.00)
GLUCOSE: 100 mg/dL — AB (ref 70–99)
POTASSIUM: 3.9 meq/L (ref 3.5–5.1)
SODIUM: 139 meq/L (ref 135–145)

## 2015-12-09 LAB — HEPATIC FUNCTION PANEL
ALBUMIN: 4.7 g/dL (ref 3.5–5.2)
ALK PHOS: 70 U/L (ref 39–117)
ALT: 19 U/L (ref 0–53)
AST: 20 U/L (ref 0–37)
Bilirubin, Direct: 0.2 mg/dL (ref 0.0–0.3)
TOTAL PROTEIN: 7.6 g/dL (ref 6.0–8.3)
Total Bilirubin: 0.9 mg/dL (ref 0.2–1.2)

## 2015-12-09 LAB — LIPID PANEL
CHOLESTEROL: 174 mg/dL (ref 0–200)
HDL: 32.2 mg/dL — ABNORMAL LOW (ref >39.00)
LDL Cholesterol: 107 mg/dL — ABNORMAL HIGH (ref 0–99)
NonHDL: 141.82
Total CHOL/HDL Ratio: 5
Triglycerides: 176 mg/dL — ABNORMAL HIGH (ref 0.0–149.0)
VLDL: 35.2 mg/dL (ref 0.0–40.0)

## 2015-12-09 LAB — MICROALBUMIN / CREATININE URINE RATIO
CREATININE, U: 93 mg/dL
MICROALB UR: 4.6 mg/dL — AB (ref 0.0–1.9)
MICROALB/CREAT RATIO: 4.9 mg/g (ref 0.0–30.0)

## 2015-12-09 LAB — HEMOGLOBIN A1C: HEMOGLOBIN A1C: 5.5 % (ref 4.6–6.5)

## 2015-12-09 LAB — TSH: TSH: 1.64 u[IU]/mL (ref 0.35–4.50)

## 2015-12-09 LAB — PSA: PSA: 2.89 ng/mL (ref 0.10–4.00)

## 2015-12-09 MED ORDER — POTASSIUM CHLORIDE ER 10 MEQ PO TBCR: EXTENDED_RELEASE_TABLET | ORAL | 3 refills | Status: DC

## 2015-12-09 MED ORDER — BENAZEPRIL HCL 40 MG PO TABS: 40.0000 mg | ORAL_TABLET | Freq: Every day | ORAL | 3 refills | Status: DC

## 2015-12-09 MED ORDER — SIMVASTATIN 20 MG PO TABS: ORAL_TABLET | ORAL | 3 refills | Status: DC

## 2015-12-09 MED ORDER — ATENOLOL-CHLORTHALIDONE 100-25 MG PO TABS: ORAL_TABLET | ORAL | 3 refills | Status: DC

## 2015-12-09 MED ORDER — TRIAMCINOLONE ACETONIDE 0.1 % EX CREA: 1.0000 "application " | TOPICAL_CREAM | Freq: Two times a day (BID) | CUTANEOUS | 1 refills | Status: AC

## 2015-12-09 MED ORDER — AMLODIPINE BESYLATE 10 MG PO TABS: ORAL_TABLET | ORAL | 3 refills | Status: DC

## 2015-12-09 MED ORDER — CLOBETASOL PROPIONATE 0.05 % EX CREA: 1.0000 "application " | TOPICAL_CREAM | Freq: Two times a day (BID) | CUTANEOUS | 1 refills | Status: DC
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
## 2015-12-09 NOTE — Progress Notes (Signed)
Pre visit review using our clinic review tool, if applicable. No additional management support is needed unless otherwise documented below in the visit note. 

## 2015-12-09 NOTE — Patient Instructions (Addendum)
You had the Pneumovax pneumonia shot today  Please take all new medication as prescribed - the steroid creams  Please continue all other medications as before, and refills have been done if requested.  Please have the pharmacy call with any other refills you may need.  Please continue your efforts at being more active, low cholesterol diet, and weight control.  You are otherwise up to date with prevention measures today.  Please keep your appointments with your specialists as you may have planned  Please go to the LAB in the Basement (turn left off the elevator) for the tests to be done today  You will be contacted by phone if any changes need to be made immediately.  Otherwise, you will receive a letter about your results with an explanation, but please check with MyChart first.  Please remember to sign up for MyChart if you have not done so, as this will be important to you in the future with finding out test results, communicating by private email, and scheduling acute appointments online when needed.  Please return in 6 months, or sooner if needed, with Lab testing done 3-5 days before

## 2015-12-15 NOTE — Assessment & Plan Note (Signed)
Stable, cont same tx 

## 2015-12-15 NOTE — Assessment & Plan Note (Signed)
stable overall by history and exam, recent data reviewed with pt, and pt to continue medical treatment as before,  to f/u any worsening symptoms or concerns Lab Results  Component Value Date   LDLCALC 107 (H) 12/09/2015

## 2015-12-15 NOTE — Assessment & Plan Note (Signed)
stable overall by history and exam, recent data reviewed with pt, and pt to continue medical treatment as before,  to f/u any worsening symptoms or concerns BP Readings from Last 3 Encounters:  12/09/15 136/72  03/25/15 116/74  01/14/15 126/82

## 2015-12-15 NOTE — Assessment & Plan Note (Signed)

## 2015-12-15 NOTE — Assessment & Plan Note (Signed)
stable overall by history and exam, recent data reviewed with pt, and pt to continue medical treatment as before,  to f/u any worsening symptoms or concerns Lab Results  Component Value Date   HGBA1C 5.5 12/09/2015

## 2015-12-15 NOTE — Assessment & Plan Note (Addendum)
Mild to mod, for topical steroid cr prn,  to f/u any worsening symptoms or concerns  In addition to the time spent performing CPE, I spent an additional 25 minutes face to face,in which greater than 50% of this time was spent in counseling and coordination of care for patient's acute illness as documented.

## 2015-12-19 ENCOUNTER — Telehealth: Payer: Self-pay | Admitting: Internal Medicine

## 2015-12-19 NOTE — Telephone Encounter (Signed)
Pt called in and said that he his legs are stilling hurting him.  They are red and still very swollen.  He said that he can not wear those compression socks because they are painful to wear.  He wants to know if he can be referred to someone who can help?  He is worried about his legscor  Best number [phone]

## 2015-12-19 NOTE — Telephone Encounter (Signed)
Not sure how to respond since the legs did not have signficant redness or swelling at the last visit  Please consider ROV such as Sat clinic in the am

## 2015-12-19 NOTE — Telephone Encounter (Signed)
Pt called back. I informed him of the notes. Pt has a doctor app tomorrow at sat clinic.

## 2015-12-20 ENCOUNTER — Encounter: Payer: Self-pay | Admitting: Family Medicine

## 2015-12-20 ENCOUNTER — Ambulatory Visit (INDEPENDENT_AMBULATORY_CARE_PROVIDER_SITE_OTHER): Payer: Medicare Other | Admitting: Family Medicine

## 2015-12-20 VITALS — BP 124/82 | HR 60 | Temp 97.9°F | Ht 66.0 in | Wt 249.0 lb

## 2015-12-20 DIAGNOSIS — L03119 Cellulitis of unspecified part of limb: Secondary | ICD-10-CM

## 2015-12-20 DIAGNOSIS — R600 Localized edema: Secondary | ICD-10-CM

## 2015-12-20 MED ORDER — CEPHALEXIN 500 MG PO CAPS: 500.0000 mg | ORAL_CAPSULE | Freq: Four times a day (QID) | ORAL | 0 refills | Status: DC

## 2015-12-20 NOTE — Progress Notes (Signed)
   Subjective:    Patient ID: Joel King, male    DOB: 1950-11-24, 65 y.o.   MRN: [id number]  HPI Here for several weeks of redness and burning in both legs. He has chronic leg edema but he has never had cellulitis before. No fever. No SOB.    Review of Systems  Constitutional: Negative.   Respiratory: Negative.   Cardiovascular: Positive for leg swelling. Negative for chest pain and palpitations.  Neurological: Negative.        Objective:   Physical Exam  Constitutional: He appears well-developed and well-nourished. No distress.  Cardiovascular: Normal rate, regular rhythm, normal heart sounds and intact distal pulses.   Pulmonary/Chest: Effort normal and breath sounds normal.  Musculoskeletal:  3+ edema in both lower legs  Skin:  Both legs below the knees are red, warm, and tender           Assessment & Plan:  Cellulitis on top of chronic venous insufficiency. Start on Keflex 500 mg QID. Elevate the legs as much as possible. Follow up with Dr. Jenny Reichmann in one week. Laurey Morale, MD

## 2015-12-20 NOTE — Progress Notes (Signed)
Pre visit review using our clinic review tool, if applicable. No additional management support is needed unless otherwise documented below in the visit note. 

## 2015-12-30 ENCOUNTER — Encounter: Payer: Self-pay | Admitting: Internal Medicine

## 2015-12-30 ENCOUNTER — Other Ambulatory Visit (INDEPENDENT_AMBULATORY_CARE_PROVIDER_SITE_OTHER): Payer: Medicare Other

## 2015-12-30 ENCOUNTER — Ambulatory Visit (INDEPENDENT_AMBULATORY_CARE_PROVIDER_SITE_OTHER): Payer: Medicare Other | Admitting: Internal Medicine

## 2015-12-30 VITALS — BP 140/80 | HR 62 | Temp 98.2°F | Resp 20 | Wt 251.4 lb

## 2015-12-30 DIAGNOSIS — L0390 Cellulitis, unspecified: Secondary | ICD-10-CM | POA: Diagnosis not present

## 2015-12-30 DIAGNOSIS — I10 Essential (primary) hypertension: Secondary | ICD-10-CM | POA: Diagnosis not present

## 2015-12-30 DIAGNOSIS — I872 Venous insufficiency (chronic) (peripheral): Secondary | ICD-10-CM | POA: Insufficient documentation

## 2015-12-30 LAB — CBC WITH DIFFERENTIAL/PLATELET
BASOS ABS: 0 10*3/uL (ref 0.0–0.1)
Basophils Relative: 0.3 % (ref 0.0–3.0)
Eosinophils Absolute: 1.2 10*3/uL — ABNORMAL HIGH (ref 0.0–0.7)
Eosinophils Relative: 9.5 % — ABNORMAL HIGH (ref 0.0–5.0)
HCT: 47.8 % (ref 39.0–52.0)
Hemoglobin: 16.5 g/dL (ref 13.0–17.0)
LYMPHS ABS: 1.2 10*3/uL (ref 0.7–4.0)
Lymphocytes Relative: 9.2 % — ABNORMAL LOW (ref 12.0–46.0)
MCHC: 34.7 g/dL (ref 30.0–36.0)
MCV: 92.5 fl (ref 78.0–100.0)
MONO ABS: 1.2 10*3/uL — AB (ref 0.1–1.0)
MONOS PCT: 9.3 % (ref 3.0–12.0)
NEUTROS ABS: 9.2 10*3/uL — AB (ref 1.4–7.7)
NEUTROS PCT: 71.7 % (ref 43.0–77.0)
PLATELETS: 234 10*3/uL (ref 150.0–400.0)
RBC: 5.16 Mil/uL (ref 4.22–5.81)
RDW: 12.7 % (ref 11.5–15.5)
WBC: 12.9 10*3/uL — ABNORMAL HIGH (ref 4.0–10.5)

## 2015-12-30 MED ORDER — VALSARTAN 160 MG PO TABS: 160.0000 mg | ORAL_TABLET | Freq: Every day | ORAL | 3 refills | Status: DC

## 2015-12-30 MED ORDER — CLINDAMYCIN HCL 300 MG PO CAPS: 300.0000 mg | ORAL_CAPSULE | Freq: Four times a day (QID) | ORAL | 0 refills | Status: DC

## 2015-12-30 MED ORDER — HYDRALAZINE HCL 50 MG PO TABS: 50.0000 mg | ORAL_TABLET | Freq: Three times a day (TID) | ORAL | 3 refills | Status: DC

## 2015-12-30 NOTE — Progress Notes (Signed)
Pre visit review using our clinic review tool, if applicable. No additional management support is needed unless otherwise documented below in the visit note. 

## 2015-12-30 NOTE — Patient Instructions (Signed)
OK to stop the Amlodipine (norvasc)  Please do not take the Diovan that was accidentally sent to your pharmacy  Please take all new medication as prescribed - the hydralazine for BP, as well as cleocin for infection  Please elevate legs when sitting if possible  Please re-start the compression stockings every day when you can  Please continue all other medications as before, and refills have been done if requested.  Please have the pharmacy call with any other refills you may need.  Please continue your efforts at being more active, low cholesterol diet, and weight control.  Please keep your appointments with your specialists as you may have planned  Please go to the LAB in the Basement (turn left off the elevator) for the tests to be done today  You will be contacted by phone if any changes need to be made immediately.  Otherwise, you will receive a letter about your results with an explanation, but please check with MyChart first.  Please remember to sign up for MyChart if you have not done so, as this will be important to you in the future with finding out test results, communicating by private email, and scheduling acute appointments online when needed.  Please return in 2 weeks, or sooner if needed

## 2015-12-30 NOTE — Progress Notes (Signed)
Subjective:    Patient ID: Joel King, male    DOB: 1950-07-21, 65 y.o.   MRN: [id number]  HPI   Here with 2-3 days onset mild to mod pain/red/swelling primarily to distal lower leg, with tenderness, heat several places but no drainage, high fevers.  Was some improved with cephalexin course when seen sept 23, but now worse again.  Pt denies chest pain, increased sob or doe, wheezing, orthopnea, PND,  palpitations, dizziness or syncope, but has had worsening left > right LE's swelling for several weeks, without significant wt change.   Wt Readings from Last 3 Encounters:  12/30/15 251 lb 6 oz (114 kg)  12/20/15 249 lb (112.9 kg)  12/09/15 250 lb (113.4 kg)   Past Medical History:  Diagnosis Date  . ANXIETY 11/21/2006  . DEPRESSION 05/31/2008  . DIABETES MELLITUS, TYPE II 07/22/2007  . Diarrhea 05/31/2008  . DVT (deep venous thrombosis) ([place]) 02/08/2014  . HYPERLIPIDEMIA 11/21/2006  . HYPERTENSION 11/17/2006  . INSOMNIA-SLEEP DISORDER-UNSPEC 07/21/2007  . OBESITY, MILD 11/17/2006  . PEPTIC ULCER DISEASE 07/22/2007  . PEPTIC ULCER DISEASE, HX OF 11/17/2006  . SINUSITIS- ACUTE-NOS 12/08/2007  . SKIN LESION 12/08/2007   Past Surgical History:  Procedure Laterality Date  . inguinal herniorrhapy     bilat  . s/p incarcerated recurrent ventral hernia    . s/p meckel's diverticulum      reports that he has never smoked. He has never used smokeless tobacco. He reports that he does not drink alcohol or use drugs. family history includes Cancer in his other; Stroke in his other. Allergies  Allergen Reactions  . Atorvastatin   . Pravastatin Sodium   . Rosuvastatin    Current Outpatient Prescriptions on File Prior to Visit  Medication Sig Dispense Refill  . aspirin 81 MG tablet Take 81 mg by mouth daily.      Marland Kitchen atenolol-chlorthalidone (TENORETIC) 100-25 MG tablet take 1 tablet by mouth once daily 90 tablet 3  . benazepril (LOTENSIN) 40 MG tablet Take 1 tablet (40 mg total) by mouth daily.  90 tablet 3  . clobetasol cream (TEMOVATE) [id number] % Apply 1 application topically 2 (two) times daily. 30 g 1  . potassium chloride (K-DUR) 10 MEQ tablet 2 tabs by mouth daily 180 tablet 3  . simvastatin (ZOCOR) 20 MG tablet TAKE 1 TABLET BY MOUTH  DAILY. 90 tablet 3  . triamcinolone cream (KENALOG) 0.1 % Apply 1 application topically 2 (two) times daily. 30 g 1  . ZOSTAVAX [id number] UNT/0.65ML injection Reported on 03/25/2015  0   No current facility-administered medications on file prior to visit.    Review of Systems  Constitutional: Negative for unusual diaphoresis or night sweats HENT: Negative for ear swelling or discharge Eyes: Negative for worsening visual haziness  Respiratory: Negative for choking and stridor.   Gastrointestinal: Negative for distension or worsening eructation Genitourinary: Negative for retention or change in urine volume.  Musculoskeletal: Negative for other MSK pain or swelling Skin: Negative for color change and worsening wound Neurological: Negative for tremors and numbness other than noted  Psychiatric/Behavioral: Negative for decreased concentration or agitation other than above       Objective:   Physical Exam BP 140/80   Pulse 62   Temp 98.2 F (36.8 C) (Oral)   Resp 20   Wt 251 lb 6 oz (114 kg)   SpO2 96%   BMI 40.57 kg/m  VS noted,  Constitutional: Pt appears in no apparent distress HENT: Head:  NCAT.  Right Ear: External ear normal.  Left Ear: External ear normal.  Eyes: . Pupils are equal, round, and reactive to light. Conjunctivae and EOM are normal Neck: Normal range of motion. Neck supple.  Cardiovascular: Normal rate and regular rhythm.   Pulmonary/Chest: Effort normal and breath sounds without rales or wheezing.  Neurological: Pt is alert. Not confused , motor grossly intact Skin:  trace to 1+ left > right LE edema, numerous varicosities and some evidence for tawny changes c/w stasis, but also large area 4x 5 cm medial mid leg below the  knee red/tender/swelling without fluctuance, ulcer or abscess  Psychiatric: Pt behavior is normal. No agitation.     Assessment & Plan:

## 2016-01-04 NOTE — Assessment & Plan Note (Addendum)
stable overall by history and exam, recent data reviewed with pt, and pt to continue medical treatment as before,  Except given leg swelling today will d/c amlodipine, and start diovan 160 qd,  to f/u any worsening symptoms or concerns BP Readings from Last 3 Encounters:  12/30/15 140/80  12/20/15 124/82  12/09/15 136/72

## 2016-01-04 NOTE — Assessment & Plan Note (Signed)
An important element of his presentation, quickly recurrent after last treatment, for cleocin empiric asd,  to f/u any worsening symptoms or concerns

## 2016-01-04 NOTE — Assessment & Plan Note (Signed)
D/w pt, for restart compression stocking use during day, leg elevation, low salt diet,  to f/u any worsening symptoms or concerns

## 2016-01-06 ENCOUNTER — Telehealth: Payer: Self-pay | Admitting: Internal Medicine

## 2016-01-06 MED ORDER — CLINDAMYCIN HCL 300 MG PO CAPS
300.0000 mg | ORAL_CAPSULE | Freq: Four times a day (QID) | ORAL | 0 refills | Status: DC
Start: 2016-01-06 — End: 2016-08-05

## 2016-01-06 NOTE — Telephone Encounter (Signed)
Patient walked in to advise that he is going to be out of the clindamycin on Thursday. His appt is scheduled for next Thursday. No available appts before then. He is asking that you call in a temporary script in the meantime, so that he is not stopping cold [place] for the cellulitis.

## 2016-01-06 NOTE — Telephone Encounter (Signed)
Medication sent to pharmacy  

## 2016-01-13 ENCOUNTER — Ambulatory Visit: Payer: Medicare Other | Admitting: Internal Medicine

## 2016-01-15 ENCOUNTER — Encounter: Payer: Self-pay | Admitting: Internal Medicine

## 2016-01-15 ENCOUNTER — Ambulatory Visit (INDEPENDENT_AMBULATORY_CARE_PROVIDER_SITE_OTHER): Payer: Medicare Other | Admitting: Internal Medicine

## 2016-01-15 VITALS — BP 128/76 | HR 64 | Temp 98.7°F | Resp 20 | Wt 250.0 lb

## 2016-01-15 DIAGNOSIS — L0390 Cellulitis, unspecified: Secondary | ICD-10-CM

## 2016-01-15 DIAGNOSIS — R21 Rash and other nonspecific skin eruption: Secondary | ICD-10-CM

## 2016-01-15 DIAGNOSIS — I872 Venous insufficiency (chronic) (peripheral): Secondary | ICD-10-CM | POA: Diagnosis not present

## 2016-01-15 DIAGNOSIS — I10 Essential (primary) hypertension: Secondary | ICD-10-CM

## 2016-01-15 DIAGNOSIS — E119 Type 2 diabetes mellitus without complications: Secondary | ICD-10-CM

## 2016-01-15 MED ORDER — KETOCONAZOLE 200 MG PO TABS: 200.0000 mg | ORAL_TABLET | Freq: Every day | ORAL | 0 refills | Status: DC

## 2016-01-15 NOTE — Patient Instructions (Signed)
Please take all new medication as prescribed - the ketoconozole antibiotic  Please remember to keep the legs elevated if you are sitting, compression stocking use during the day, low salt diet, and weight control  Please continue all other medications as before, and refills have been done if requested.  Please have the pharmacy call with any other refills you may need.  Please continue your efforts at being more active, low cholesterol diet, and weight control.  Please keep your appointments with your specialists as you may have planned  Your next blood work is due about May 07, 2016  You will be contacted regarding the referral for: Dermatology - Dr Nevada Crane (though you can cancel if you improve)

## 2016-01-15 NOTE — Progress Notes (Signed)
Subjective:    Patient ID: LIEUTENANT PALERMO, male    DOB: 26-Aug-1950, 65 y.o.   MRN: [id number]  HPI  Here to f/u recent tx for cellulitis LE's, finished antibx with some resolution of heat and tender erythema mid right leg, but unfortunately now with non tender/non warm worsening erythema to a very large area RLE anteriorly below the groin (and some post distal RLE), and no significant other improvement LLE, with some extension of the rash slightly to right lower abd wall as well.   BP mildly high a few times, but more usually less than 140/90, now off the amlodipine, taking hydralazine 50 tid Wt Readings from Last 3 Encounters:  01/15/16 250 lb (113.4 kg)  12/30/15 251 lb 6 oz (114 kg)  12/20/15 249 lb (112.9 kg)   BP Readings from Last 3 Encounters:  01/15/16 128/76  12/30/15 140/80  12/20/15 124/82   Past Medical History:  Diagnosis Date  . ANXIETY 11/21/2006  . DEPRESSION 05/31/2008  . DIABETES MELLITUS, TYPE II 07/22/2007  . Diarrhea 05/31/2008  . DVT (deep venous thrombosis) ([place]) 02/08/2014  . HYPERLIPIDEMIA 11/21/2006  . HYPERTENSION 11/17/2006  . INSOMNIA-SLEEP DISORDER-UNSPEC 07/21/2007  . OBESITY, MILD 11/17/2006  . PEPTIC ULCER DISEASE 07/22/2007  . PEPTIC ULCER DISEASE, HX OF 11/17/2006  . SINUSITIS- ACUTE-NOS 12/08/2007  . SKIN LESION 12/08/2007   Past Surgical History:  Procedure Laterality Date  . inguinal herniorrhapy     bilat  . s/p incarcerated recurrent ventral hernia    . s/p meckel's diverticulum      reports that he has never smoked. He has never used smokeless tobacco. He reports that he does not drink alcohol or use drugs. family history includes Cancer in his other; Stroke in his other. Allergies  Allergen Reactions  . Atorvastatin   . Pravastatin Sodium   . Rosuvastatin    Current Outpatient Prescriptions on File Prior to Visit  Medication Sig Dispense Refill  . aspirin 81 MG tablet Take 81 mg by mouth daily.      Marland Kitchen atenolol-chlorthalidone  (TENORETIC) 100-25 MG tablet take 1 tablet by mouth once daily 90 tablet 3  . benazepril (LOTENSIN) 40 MG tablet Take 1 tablet (40 mg total) by mouth daily. 90 tablet 3  . clindamycin (CLEOCIN) 300 MG capsule Take 1 capsule (300 mg total) by mouth 4 (four) times daily. 40 capsule 0  . clobetasol cream (TEMOVATE) [id number] % Apply 1 application topically 2 (two) times daily. 30 g 1  . hydrALAZINE (APRESOLINE) 50 MG tablet Take 1 tablet (50 mg total) by mouth 3 (three) times daily. 270 tablet 3  . potassium chloride (K-DUR) 10 MEQ tablet 2 tabs by mouth daily 180 tablet 3  . simvastatin (ZOCOR) 20 MG tablet TAKE 1 TABLET BY MOUTH  DAILY. 90 tablet 3  . triamcinolone cream (KENALOG) 0.1 % Apply 1 application topically 2 (two) times daily. 30 g 1  . ZOSTAVAX 60454 UNT/0.65ML injection Reported on 03/25/2015  0   No current facility-administered medications on file prior to visit.    Review of Systems  Constitutional: Negative for unusual diaphoresis or night sweats HENT: Negative for ear swelling or discharge Eyes: Negative for worsening visual haziness  Respiratory: Negative for choking and stridor.   Gastrointestinal: Negative for distension or worsening eructation Genitourinary: Negative for retention or change in urine volume.  Musculoskeletal: Negative for other MSK pain or swelling Skin: Negative for color change and worsening wound Neurological: Negative for tremors and numbness other  than noted  Psychiatric/Behavioral: Negative for decreased concentration or agitation other than above   All other neg per pt    Objective:   Physical Exam BP 128/76   Pulse 64   Temp 98.7 F (37.1 C) (Oral)   Resp 20   Wt 250 lb (113.4 kg)   SpO2 97%   BMI 40.35 kg/m  VS noted,  Constitutional: Pt appears in no apparent distress HENT: Head: NCAT.  Right Ear: External ear normal.  Left Ear: External ear normal.  Eyes: . Pupils are equal, round, and reactive to light. Conjunctivae and EOM are  normal Neck: Normal range of motion. Neck supple.  Cardiovascular: Normal rate and regular rhythm.   Pulmonary/Chest: Effort normal and breath sounds without rales or wheezing.  Abd:  Soft, NT, ND, + BS Neurological: Pt is alert. Not confused , motor grossly intact Skin: Skin is warm. No rash, no LE edema Psychiatric: Pt behavior is normal. No agitation.  Left LE edema slightly less, but still marked erythema, no ulcers or weepiness, non tender RLE with marked area nontender erythema to anterior, and some post distal LLE as well.  Dorsalis pedis 1+ bilat    Assessment & Plan:

## 2016-01-15 NOTE — Progress Notes (Signed)
Pre visit review using our clinic review tool, if applicable. No additional management support is needed unless otherwise documented below in the visit note. 

## 2016-01-18 NOTE — Assessment & Plan Note (Signed)
Mid right distal RLE improved heat and tenderness, no further antibx needed

## 2016-01-18 NOTE — Assessment & Plan Note (Signed)
stable overall by history and exam, recent data reviewed with pt, and pt to continue medical treatment as before,  to f/u any worsening symptoms or concerns Lab Results  Component Value Date   HGBA1C 5.5 12/09/2015

## 2016-01-18 NOTE — Assessment & Plan Note (Signed)
D/w pt, also an element by exam, for leg elevation, compression stockings, low salt diet, wt loss

## 2016-01-18 NOTE — Assessment & Plan Note (Signed)
stable overall by history and exam, recent data reviewed with pt, and pt to continue medical treatment as before,  to f/u any worsening symptoms or concerns BP Readings from Last 3 Encounters:  01/15/16 128/76  12/30/15 140/80  12/20/15 124/82

## 2016-01-18 NOTE — Assessment & Plan Note (Signed)
Suspect worsening fungal rash, for antifungal tx, refer dermatology

## 2016-01-22 DIAGNOSIS — I872 Venous insufficiency (chronic) (peripheral): Secondary | ICD-10-CM | POA: Diagnosis not present

## 2016-01-22 DIAGNOSIS — L820 Inflamed seborrheic keratosis: Secondary | ICD-10-CM | POA: Diagnosis not present

## 2016-01-22 DIAGNOSIS — B355 Tinea imbricata: Secondary | ICD-10-CM | POA: Diagnosis not present

## 2016-02-05 DIAGNOSIS — B355 Tinea imbricata: Secondary | ICD-10-CM | POA: Diagnosis not present

## 2016-02-05 DIAGNOSIS — I872 Venous insufficiency (chronic) (peripheral): Secondary | ICD-10-CM | POA: Diagnosis not present

## 2016-02-17 ENCOUNTER — Telehealth: Payer: Self-pay | Admitting: Internal Medicine

## 2016-02-17 NOTE — Telephone Encounter (Signed)
This would be extremely unusual, and I doubt that it is true since it so very commonly used  ? Perhaps the letter means the med is on back order?  Please ask pt to send the letter or drop off for [place] to look at, as we have not heard this before, and will affect many other patients

## 2016-02-17 NOTE — Telephone Encounter (Signed)
Patient walked in. States that he got a letter from [facility] stating that potassium chloride (K-DUR) 10 MEQ tablet [id number]  Is no longer being manufactured. Please prescribe an alternative.

## 2016-02-18 NOTE — Telephone Encounter (Signed)
Called patient unable to reach left message to give us a call back.

## 2016-02-18 NOTE — Telephone Encounter (Signed)
Patient called back.  Gave MD response.  °

## 2016-02-24 ENCOUNTER — Other Ambulatory Visit: Payer: Self-pay | Admitting: Internal Medicine

## 2016-02-24 MED ORDER — POTASSIUM CHLORIDE ER 10 MEQ PO TBCR: EXTENDED_RELEASE_TABLET | ORAL | 3 refills | Status: DC

## 2016-02-24 NOTE — Telephone Encounter (Signed)
Pt requested refill

## 2016-02-27 ENCOUNTER — Telehealth: Payer: Self-pay | Admitting: Internal Medicine

## 2016-02-27 NOTE — Telephone Encounter (Signed)
Ok to try taking half for now, as he really needs better BP control

## 2016-02-27 NOTE — Telephone Encounter (Signed)
hydrALAZINE (APRESOLINE) 50 MG   Patient is requesting to have this medication changed. He states that the medication is giving him headaches. Please follow up with patient. Thank you.

## 2016-03-02 NOTE — Telephone Encounter (Signed)
Patient walked in today. States that he is open to taking a half dosage of the hydralazine. Please send to [facility] on file x90. ty

## 2016-03-03 MED ORDER — HYDRALAZINE HCL 25 MG PO TABS
25.0000 mg | ORAL_TABLET | Freq: Three times a day (TID) | ORAL | 3 refills | Status: DC
Start: 2016-03-03 — End: 2016-04-30

## 2016-03-03 NOTE — Telephone Encounter (Signed)
Hydralazine 25 tid done erx to [facility]

## 2016-03-04 ENCOUNTER — Encounter (INDEPENDENT_AMBULATORY_CARE_PROVIDER_SITE_OTHER): Payer: Self-pay | Admitting: Orthopedic Surgery

## 2016-03-04 ENCOUNTER — Ambulatory Visit (INDEPENDENT_AMBULATORY_CARE_PROVIDER_SITE_OTHER): Payer: Medicare Other | Admitting: Orthopedic Surgery

## 2016-03-04 VITALS — Ht 66.0 in | Wt 250.0 lb

## 2016-03-04 DIAGNOSIS — M25562 Pain in left knee: Secondary | ICD-10-CM | POA: Diagnosis not present

## 2016-03-04 DIAGNOSIS — I87323 Chronic venous hypertension (idiopathic) with inflammation of bilateral lower extremity: Secondary | ICD-10-CM | POA: Diagnosis not present

## 2016-03-04 DIAGNOSIS — M25561 Pain in right knee: Secondary | ICD-10-CM

## 2016-03-04 DIAGNOSIS — G8929 Other chronic pain: Secondary | ICD-10-CM

## 2016-03-04 MED ORDER — METHYLPREDNISOLONE ACETATE 40 MG/ML IJ SUSP
40.0000 mg | INTRAMUSCULAR | Status: AC | PRN
  Administered 2016-03-04: 40 mg via INTRA_ARTICULAR

## 2016-03-04 MED ORDER — LIDOCAINE HCL 1 % IJ SOLN
5.0000 mL | INTRAMUSCULAR | Status: AC | PRN
  Administered 2016-03-04: 5 mL

## 2016-03-04 NOTE — Progress Notes (Signed)
Office Visit Note   Patient: Joel King           Date of Birth: [birth year]           MRN: [id number] Visit Date: 03/04/2016              Requested by: Biagio Borg, MD [address], [place] [address] PCP: Cathlean Cower, MD   Assessment & Plan: Visit Diagnoses:  1. Chronic pain of left knee   2. Idiopathic chronic venous hypertension of both lower extremities with inflammation   3. Chronic pain of right knee     Plan: Patient is not having sufficient compression with 15-20 mm compression for both legs he is given a prescription per [facility] medical supply for 30-40 m compression. After informed consent the left knee was injected from the anterior medial portal with Depo-Medrol. Patient tolerated this well follow-up as needed he has had good interval relief with previous steroid injections.  Follow-Up Instructions: Return if symptoms worsen or fail to improve.   Orders:  No orders of the defined types were placed in this encounter.  No orders of the defined types were placed in this encounter.     Procedures: Large Joint Inj Date/Time: 03/04/2016 8:48 AM Performed by: DUDA, MARCUS V Authorized by: Newt Minion   Consent Given by:  Patient Site marked: the procedure site was marked   Timeout: prior to procedure the correct patient, procedure, and site was verified   Indications:  Pain and diagnostic evaluation Location:  Knee Site:  L knee Prep: patient was prepped and draped in usual sterile fashion   Needle Size:  22 G Needle Length:  1.5 inches Approach:  Anteromedial Ultrasound Guidance: No   Fluoroscopic Guidance: No   Arthrogram: No   Medications:  5 mL lidocaine 1 %; 40 mg methylPREDNISolone acetate 40 MG/ML Aspiration Attempted: No   Patient tolerance:  Patient tolerated the procedure well with no immediate complications     Clinical Data: No additional findings.   Subjective: Chief Complaint  Patient presents with  . Left Knee  - Pain    Patient presents for bilateral knee pain. Left is doing worse than the right. He denies giving way. He reports chronic edema bilateral lower extremities, which he is wearing compression stockings for. He has had injection right knee 07/2015. He would like repeat injection today if possible.     Review of Systems   Objective: Vital Signs: Ht 5\' 6"  (1.676 m)   Wt 250 lb (113.4 kg)   BMI 40.35 kg/m   Physical Exam on examination patient is alert oriented no adenopathy well-dressed normal affect normal respiratory effort he has an antalgic gait. Examination he has massive venous stasis swelling with dermatitis in both legs. He has pending ulcerations over the medial malleolus left ankle with dry cracked skin there is no cellulitis no signs of infection. Of note patient has been on 3 courses of antibiotics stating that he had an infection in the area of the rash on his legs and abdomen. Examination he does have what appears to be a fungal rash of his abdomen recommended over-the-counter anti-fungal powder or cream. Patient has pain and swelling in both knees worse than the left than the right he is tender to palpation the lateral joint lines) cruciate are stable. There is crepitation range of motion of his knee. Patient has had good interval relief with steroid injection right knee.  Ortho Exam  Specialty  Comments:  No specialty comments available.  Imaging: No results found.   PMFS History: Patient Active Problem List   Diagnosis Date Noted  . Rash 01/15/2016  . Stasis dermatitis 01/15/2016  . Chronic venous insufficiency 12/30/2015  . Cellulitis 12/30/2015  . Itching 12/09/2015  . Dizziness 03/25/2015  . Skin lesion of face 03/25/2015  . Abnormal liver function 03/25/2015  . Hypokalemia 03/25/2015  . Acute upper respiratory infection 01/14/2015  . Neuritis of right lower extremity 08/20/2014  . DVT (deep venous thrombosis) ([place]) 02/08/2014  . Right knee pain 12/22/2013   . Fatigue 11/02/2010  . Encounter for well adult exam with abnormal findings 11/01/2010  . DEPRESSION 05/31/2008  . Diarrhea 05/31/2008  . SKIN LESION 12/08/2007  . Diabetes ([place]) 07/22/2007  . PEPTIC ULCER DISEASE 07/22/2007  . INSOMNIA-SLEEP DISORDER-UNSPEC 07/21/2007  . Hyperlipidemia 11/21/2006  . Anxiety state 11/21/2006  . OBESITY, MILD 11/17/2006  . Essential hypertension 11/17/2006   Past Medical History:  Diagnosis Date  . ANXIETY 11/21/2006  . DEPRESSION 05/31/2008  . DIABETES MELLITUS, TYPE II 07/22/2007  . Diarrhea 05/31/2008  . DVT (deep venous thrombosis) ([place]) 02/08/2014  . HYPERLIPIDEMIA 11/21/2006  . HYPERTENSION 11/17/2006  . INSOMNIA-SLEEP DISORDER-UNSPEC 07/21/2007  . OBESITY, MILD 11/17/2006  . PEPTIC ULCER DISEASE 07/22/2007  . PEPTIC ULCER DISEASE, HX OF 11/17/2006  . SINUSITIS- ACUTE-NOS 12/08/2007  . SKIN LESION 12/08/2007    Family History  Problem Relation Age of Onset  . Stroke Other   . Cancer Other     prostate    Past Surgical History:  Procedure Laterality Date  . inguinal herniorrhapy     bilat  . s/p incarcerated recurrent ventral hernia    . s/p meckel's diverticulum     Social History   Occupational History  . Not on file.   Social History Main Topics  . Smoking status: Never Smoker  . Smokeless tobacco: Never Used  . Alcohol use No  . Drug use: No  . Sexual activity: Not on file

## 2016-03-04 NOTE — Telephone Encounter (Signed)
Patient informed medication is at pharmacy.

## 2016-03-16 ENCOUNTER — Encounter (INDEPENDENT_AMBULATORY_CARE_PROVIDER_SITE_OTHER): Payer: Self-pay | Admitting: Orthopedic Surgery

## 2016-03-16 ENCOUNTER — Ambulatory Visit (INDEPENDENT_AMBULATORY_CARE_PROVIDER_SITE_OTHER): Payer: Medicare Other | Admitting: Orthopedic Surgery

## 2016-03-16 VITALS — Ht 66.0 in | Wt 250.0 lb

## 2016-03-16 DIAGNOSIS — M722 Plantar fascial fibromatosis: Secondary | ICD-10-CM | POA: Diagnosis not present

## 2016-03-16 DIAGNOSIS — M79671 Pain in right foot: Secondary | ICD-10-CM | POA: Diagnosis not present

## 2016-03-16 DIAGNOSIS — M6701 Short Achilles tendon (acquired), right ankle: Secondary | ICD-10-CM

## 2016-03-16 DIAGNOSIS — M7661 Achilles tendinitis, right leg: Secondary | ICD-10-CM

## 2016-03-16 MED ORDER — LIDOCAINE HCL 1 % IJ SOLN
2.0000 mL | INTRAMUSCULAR | Status: AC | PRN
  Administered 2016-03-16: 2 mL

## 2016-03-16 MED ORDER — METHYLPREDNISOLONE ACETATE 40 MG/ML IJ SUSP
40.0000 mg | INTRAMUSCULAR | Status: AC | PRN
  Administered 2016-03-16: 40 mg

## 2016-03-16 NOTE — Progress Notes (Addendum)
Office Visit Note   Patient: Joel King           Date of Birth: [birth year]           MRN: [id number] Visit Date: 03/16/2016              Requested by: Biagio Borg, MD [address] [place], [place] [address] PCP: Cathlean Cower, MD   Assessment & Plan: Visit Diagnoses:  1. Plantar fasciitis   2. Achilles tendinitis, right leg     Plan: Injection of the right fascia today. Instructed him on heel cord stretching. May use anti-inflammatories. We'll follow-up in the office as needed.  Follow-Up Instructions: Return in about 4 weeks (around 04/13/2016), or if symptoms worsen or fail to improve.   Orders:  No orders of the defined types were placed in this encounter.  No orders of the defined types were placed in this encounter.     Procedures: Foot Inj Date/Time: 03/16/2016 3:38 PM Performed by: Dondra Prader RENEE Authorized by: Dondra Prader RENEE   Consent Given by:  Patient Site marked: the procedure site was marked   Timeout: prior to procedure the correct patient, procedure, and site was verified   Indications:  Fasciitis and pain Condition: Plantar Fasciitis   Location: right plantar fascia muscle   Prep: patient was prepped and draped in usual sterile fashion   Needle Size:  22 G Medications:  2 mL lidocaine 1 %; 40 mg methylPREDNISolone acetate 40 MG/ML Patient Tolerance:  Patient tolerated the procedure well with no immediate complications     Clinical Data: No additional findings.   Subjective: Chief Complaint  Patient presents with  . Right Foot - Pain    "heel spurs" NKI    The patient is a 65 year old gentleman who is seen today for evaluation of Right heel pain. States that the pain has been getting worse. Points to origin of plantar fascia as well as insertion of Achilles has painful areas. He did have x rays back in June of this year with [name] and he did a cortisone injection with him at that time and this did last for a while per  pt report. States the pain him has just returned a couple weeks ago to the right heel. He states that now it is extremely tender and that he is having difficulty walking on this foot.   Is a new balance walking shoes with custom orthotics made by [name]. Has been rolling his foot over a can.     Review of Systems  Constitutional: Negative for chills and fever.     Objective: Vital Signs: Ht 5\' 6"  (1.676 m)   Wt 250 lb (113.4 kg)   BMI 40.35 kg/m   Physical Exam  Constitutional: He is oriented to person, place, and time. He appears well-developed and well-nourished.  Pulmonary/Chest: Effort normal.  Musculoskeletal:       Right ankle: Achilles tendon exhibits pain. Achilles tendon exhibits no defect and normal Thompson's test results.       Right foot: There is tenderness.  Point tender at origin of plantar fascia. .  Does have heel cord tightness with dorsiflexion to neutral.    Neurological: He is alert and oriented to person, place, and time.  Psychiatric: He has a normal mood and affect.  Nursing note reviewed.   Ortho Exam  Specialty Comments:  No specialty comments available.  Imaging: No results found.   PMFS History: Patient Active  Problem List   Diagnosis Date Noted  . Plantar fasciitis 03/16/2016  . Achilles tendinitis, right leg 03/16/2016  . Rash 01/15/2016  . Stasis dermatitis 01/15/2016  . Chronic venous insufficiency 12/30/2015  . Cellulitis 12/30/2015  . Itching 12/09/2015  . Dizziness 03/25/2015  . Skin lesion of face 03/25/2015  . Abnormal liver function 03/25/2015  . Hypokalemia 03/25/2015  . Acute upper respiratory infection 01/14/2015  . Neuritis of right lower extremity 08/20/2014  . DVT (deep venous thrombosis) ([place]) 02/08/2014  . Right knee pain 12/22/2013  . Fatigue 11/02/2010  . Encounter for well adult exam with abnormal findings 11/01/2010  . DEPRESSION 05/31/2008  . Diarrhea 05/31/2008  . SKIN LESION 12/08/2007  . Diabetes  ([place]) 07/22/2007  . PEPTIC ULCER DISEASE 07/22/2007  . INSOMNIA-SLEEP DISORDER-UNSPEC 07/21/2007  . Hyperlipidemia 11/21/2006  . Anxiety state 11/21/2006  . OBESITY, MILD 11/17/2006  . Essential hypertension 11/17/2006   Past Medical History:  Diagnosis Date  . ANXIETY 11/21/2006  . DEPRESSION 05/31/2008  . DIABETES MELLITUS, TYPE II 07/22/2007  . Diarrhea 05/31/2008  . DVT (deep venous thrombosis) ([place]) 02/08/2014  . HYPERLIPIDEMIA 11/21/2006  . HYPERTENSION 11/17/2006  . INSOMNIA-SLEEP DISORDER-UNSPEC 07/21/2007  . OBESITY, MILD 11/17/2006  . PEPTIC ULCER DISEASE 07/22/2007  . PEPTIC ULCER DISEASE, HX OF 11/17/2006  . SINUSITIS- ACUTE-NOS 12/08/2007  . SKIN LESION 12/08/2007    Family History  Problem Relation Age of Onset  . Stroke Other   . Cancer Other     prostate    Past Surgical History:  Procedure Laterality Date  . inguinal herniorrhapy     bilat  . s/p incarcerated recurrent ventral hernia    . s/p meckel's diverticulum     Social History   Occupational History  . Not on file.   Social History Main Topics  . Smoking status: Never Smoker  . Smokeless tobacco: Never Used  . Alcohol use No  . Drug use: No  . Sexual activity: Not on file

## 2016-04-08 ENCOUNTER — Telehealth: Payer: Self-pay | Admitting: Emergency Medicine

## 2016-04-08 NOTE — Telephone Encounter (Signed)
Unable to reach patient left message to give us a call back. 

## 2016-04-08 NOTE — Telephone Encounter (Signed)
Pt came in and stated at his last appt Dr Jenny Reichmann changed his dosage on hydrALAZINE (APRESOLINE) 25 MG tablet. He is still having headaches and wants to know if he wants him to change his medication or change the dosage again. Pharmacy is [facility] [place] Please advise thank.

## 2016-04-08 NOTE — Telephone Encounter (Signed)
Can he tell us how his BP are running at home?    Hydralazine does not often cause headaches, this is very unusual, so I would think the HA's are possibly not related

## 2016-04-23 ENCOUNTER — Other Ambulatory Visit: Payer: Self-pay | Admitting: *Deleted

## 2016-04-23 MED ORDER — BENAZEPRIL HCL 40 MG PO TABS: 40.0000 mg | ORAL_TABLET | Freq: Every day | ORAL | 2 refills | Status: DC

## 2016-04-23 MED ORDER — POTASSIUM CHLORIDE ER 10 MEQ PO TBCR: EXTENDED_RELEASE_TABLET | ORAL | 2 refills | Status: DC

## 2016-04-23 MED ORDER — SIMVASTATIN 20 MG PO TABS: ORAL_TABLET | ORAL | 2 refills | Status: DC

## 2016-04-23 MED ORDER — ATENOLOL-CHLORTHALIDONE 100-25 MG PO TABS: ORAL_TABLET | ORAL | 2 refills | Status: DC

## 2016-04-29 ENCOUNTER — Telehealth: Payer: Self-pay

## 2016-04-29 NOTE — Telephone Encounter (Signed)
Please advise patient wants to go back on amlodipine instead of the hydrazaline.

## 2016-04-30 MED ORDER — AMLODIPINE BESYLATE 10 MG PO TABS: 10.0000 mg | ORAL_TABLET | Freq: Every day | ORAL | 3 refills | Status: DC

## 2016-04-30 NOTE — Telephone Encounter (Signed)
Ok for chang hydralazine to amlodipine 10 qd - done ers

## 2016-04-30 NOTE — Addendum Note (Signed)
Addended by: Biagio Borg on: 04/30/2016 12:48 PM   Modules accepted: Orders

## 2016-04-30 NOTE — Telephone Encounter (Signed)
Left message for patient

## 2016-08-05 ENCOUNTER — Encounter: Payer: Self-pay | Admitting: Internal Medicine

## 2016-08-05 ENCOUNTER — Other Ambulatory Visit (INDEPENDENT_AMBULATORY_CARE_PROVIDER_SITE_OTHER): Payer: Medicare PPO

## 2016-08-05 ENCOUNTER — Ambulatory Visit (INDEPENDENT_AMBULATORY_CARE_PROVIDER_SITE_OTHER): Payer: Medicare PPO | Admitting: Internal Medicine

## 2016-08-05 VITALS — BP 118/64 | HR 65 | Ht 66.0 in | Wt 250.0 lb

## 2016-08-05 DIAGNOSIS — Z1159 Encounter for screening for other viral diseases: Secondary | ICD-10-CM | POA: Diagnosis not present

## 2016-08-05 DIAGNOSIS — R7303 Prediabetes: Secondary | ICD-10-CM

## 2016-08-05 DIAGNOSIS — Z0000 Encounter for general adult medical examination without abnormal findings: Secondary | ICD-10-CM

## 2016-08-05 DIAGNOSIS — Z0001 Encounter for general adult medical examination with abnormal findings: Secondary | ICD-10-CM

## 2016-08-05 DIAGNOSIS — G4700 Insomnia, unspecified: Secondary | ICD-10-CM | POA: Diagnosis not present

## 2016-08-05 DIAGNOSIS — R7989 Other specified abnormal findings of blood chemistry: Secondary | ICD-10-CM | POA: Diagnosis not present

## 2016-08-05 LAB — BASIC METABOLIC PANEL
BUN: 16 mg/dL (ref 6–23)
CHLORIDE: 103 meq/L (ref 96–112)
CO2: 30 mEq/L (ref 19–32)
CREATININE: 0.93 mg/dL (ref 0.40–1.50)
Calcium: 9.6 mg/dL (ref 8.4–10.5)
GFR: 86.36 mL/min (ref >60.00)
Glucose, Bld: 110 mg/dL — ABNORMAL HIGH (ref 70–99)
POTASSIUM: 3.6 meq/L (ref 3.5–5.1)
SODIUM: 140 meq/L (ref 135–145)

## 2016-08-05 LAB — HEPATIC FUNCTION PANEL
ALK PHOS: 60 U/L (ref 39–117)
ALT: 20 U/L (ref 0–53)
AST: 20 U/L (ref 0–37)
Albumin: 4.7 g/dL (ref 3.5–5.2)
BILIRUBIN DIRECT: 0.1 mg/dL (ref 0.0–0.3)
BILIRUBIN TOTAL: 0.8 mg/dL (ref 0.2–1.2)
Total Protein: 7.2 g/dL (ref 6.0–8.3)

## 2016-08-05 LAB — CBC WITH DIFFERENTIAL/PLATELET
Basophils Absolute: 0 10*3/uL (ref 0.0–0.1)
Basophils Relative: 0.4 % (ref 0.0–3.0)
EOS ABS: 0.2 10*3/uL (ref 0.0–0.7)
Eosinophils Relative: 2.3 % (ref 0.0–5.0)
HEMATOCRIT: 46.9 % (ref 39.0–52.0)
Hemoglobin: 16.6 g/dL (ref 13.0–17.0)
LYMPHS ABS: 1.3 10*3/uL (ref 0.7–4.0)
LYMPHS PCT: 12.2 % (ref 12.0–46.0)
MCHC: 35.4 g/dL (ref 30.0–36.0)
MCV: 91.5 fl (ref 78.0–100.0)
Monocytes Absolute: 1 10*3/uL (ref 0.1–1.0)
Monocytes Relative: 10 % (ref 3.0–12.0)
NEUTROS ABS: 7.7 10*3/uL (ref 1.4–7.7)
Neutrophils Relative %: 75.1 % (ref 43.0–77.0)
Platelets: 204 10*3/uL (ref 150.0–400.0)
RBC: 5.13 Mil/uL (ref 4.22–5.81)
RDW: 13.4 % (ref 11.5–15.5)
WBC: 10.3 10*3/uL (ref 4.0–10.5)

## 2016-08-05 LAB — URINALYSIS, ROUTINE W REFLEX MICROSCOPIC
Bilirubin Urine: NEGATIVE
Hgb urine dipstick: NEGATIVE
KETONES UR: NEGATIVE
Nitrite: NEGATIVE
SPECIFIC GRAVITY, URINE: 1.01 (ref 1.000–1.030)
Total Protein, Urine: NEGATIVE
Urine Glucose: NEGATIVE
Urobilinogen, UA: 0.2 (ref 0.0–1.0)
pH: 7 (ref 5.0–8.0)

## 2016-08-05 LAB — LIPID PANEL
CHOL/HDL RATIO: 5
Cholesterol: 165 mg/dL (ref 0–200)
HDL: 33.8 mg/dL — ABNORMAL LOW (ref >39.00)
NonHDL: 130.97
Triglycerides: 213 mg/dL — ABNORMAL HIGH (ref 0.0–149.0)
VLDL: 42.6 mg/dL — ABNORMAL HIGH (ref 0.0–40.0)

## 2016-08-05 LAB — TSH: TSH: 1.26 u[IU]/mL (ref 0.35–4.50)

## 2016-08-05 LAB — HEMOGLOBIN A1C: HEMOGLOBIN A1C: 5.4 % (ref 4.6–6.5)

## 2016-08-05 LAB — LDL CHOLESTEROL, DIRECT: LDL DIRECT: 106 mg/dL

## 2016-08-05 LAB — PSA: PSA: 3.62 ng/mL (ref 0.10–4.00)

## 2016-08-05 LAB — HEPATITIS C ANTIBODY: HCV Ab: NEGATIVE

## 2016-08-05 MED ORDER — SIMVASTATIN 20 MG PO TABS: ORAL_TABLET | ORAL | 3 refills | Status: DC

## 2016-08-05 MED ORDER — ROSUVASTATIN CALCIUM 20 MG PO TABS: 20.0000 mg | ORAL_TABLET | Freq: Every day | ORAL | 3 refills | Status: DC

## 2016-08-05 MED ORDER — TRAZODONE HCL 50 MG PO TABS: 25.0000 mg | ORAL_TABLET | Freq: Every evening | ORAL | 1 refills | Status: DC | PRN

## 2016-08-05 NOTE — Progress Notes (Signed)
Subjective:    Patient ID: Joel King, male    DOB: Apr 16, 1950, 66 y.o.   MRN: [id number]  HPI  Here for wellness and f/u;  Overall doing ok;  Pt denies Chest pain, worsening SOB, DOE, wheezing, orthopnea, PND, worsening LE edema, palpitations, dizziness or syncope.  Pt denies neurological change such as new headache, facial or extremity weakness.  Pt denies polydipsia, polyuria, or low sugar symptoms. Pt states overall good compliance with treatment and medications, good tolerability, and has been trying to follow appropriate diet.  Pt denies worsening depressive symptoms, suicidal ideation or panic. No fever, night sweats, wt loss, loss of appetite, or other constitutional symptoms.  Pt states good ability with ADL's, has low fall risk, home safety reviewed and adequate, no other significant changes in hearing or vision, and not active with exercise. Wt Readings from Last 3 Encounters:  08/05/16 250 lb (113.4 kg)  03/16/16 250 lb (113.4 kg)  03/04/16 250 lb (113.4 kg)  Has chronic persistent bilat edema, wears compression stockings. Has early cataracts, plans to f/u optho later this yr. Decliens colnoscopy for now Denies worsening reflux, abd pain, dysphagia, n/v, bowel change or blood.  Ambien not helping with sleep nowl asks for change Past Medical History:  Diagnosis Date  . ANXIETY 11/21/2006  . DEPRESSION 05/31/2008  . DIABETES MELLITUS, TYPE II 07/22/2007  . Diarrhea 05/31/2008  . DVT (deep venous thrombosis) ([place]) 02/08/2014  . HYPERLIPIDEMIA 11/21/2006  . HYPERTENSION 11/17/2006  . INSOMNIA-SLEEP DISORDER-UNSPEC 07/21/2007  . OBESITY, MILD 11/17/2006  . PEPTIC ULCER DISEASE 07/22/2007  . PEPTIC ULCER DISEASE, HX OF 11/17/2006  . SINUSITIS- ACUTE-NOS 12/08/2007  . SKIN LESION 12/08/2007   Past Surgical History:  Procedure Laterality Date  . inguinal herniorrhapy     bilat  . s/p incarcerated recurrent ventral hernia    . s/p meckel's diverticulum      reports that he has never  smoked. He has never used smokeless tobacco. He reports that he does not drink alcohol or use drugs. family history includes Cancer in his other; Stroke in his other. Allergies  Allergen Reactions  . Atorvastatin   . Pravastatin Sodium   . Rosuvastatin    Current Outpatient Prescriptions on File Prior to Visit  Medication Sig Dispense Refill  . amLODipine (NORVASC) 10 MG tablet Take 1 tablet (10 mg total) by mouth daily. 90 tablet 3  . aspirin 81 MG tablet Take 81 mg by mouth daily.      Marland Kitchen atenolol-chlorthalidone (TENORETIC) 100-25 MG tablet take 1 tablet by mouth once daily 90 tablet 2  . benazepril (LOTENSIN) 40 MG tablet Take 1 tablet (40 mg total) by mouth daily. 90 tablet 2  . clobetasol cream (TEMOVATE) 0.94 % Apply 1 application topically 2 (two) times daily. 30 g 1  . ketoconazole (NIZORAL) 200 MG tablet Take 1 tablet (200 mg total) by mouth daily. 10 tablet 0  . potassium chloride (K-DUR) 10 MEQ tablet 2 tabs by mouth daily 180 tablet 2  . triamcinolone cream (KENALOG) 0.1 % Apply 1 application topically 2 (two) times daily. 30 g 1   No current facility-administered medications on file prior to visit.    Review of Systems Constitutional: Negative for other unusual diaphoresis, sweats, appetite or weight changes HENT: Negative for other worsening hearing loss, ear pain, facial swelling, mouth sores or neck stiffness.   Eyes: Negative for other worsening pain, redness or other visual disturbance.  Respiratory: Negative for other stridor or swelling Cardiovascular:  Negative for other palpitations or other chest pain  Gastrointestinal: Negative for worsening diarrhea or loose stools, blood in stool, distention or other pain Genitourinary: Negative for hematuria, flank pain or other change in urine volume.  Musculoskeletal: Negative for myalgias or other joint swelling.  Skin: Negative for other color change, or other wound or worsening drainage.  Neurological: Negative for other  syncope or numbness. Hematological: Negative for other adenopathy or swelling Psychiatric/Behavioral: Negative for hallucinations, other worsening agitation, SI, self-injury, or new decreased concentration \\All  other system neg per pt    Objective:   Physical Exam BP 118/64   Pulse 65   Ht 5\' 6"  (1.676 m)   Wt 250 lb (113.4 kg)   SpO2 99%   BMI 40.35 kg/m  VS noted, morbid obese Constitutional: Pt is oriented to person, place, and time. Appears well-developed and well-nourished, in no significant distress and comfortable Head: Normocephalic and atraumatic  Eyes: Conjunctivae and EOM are normal. Pupils are equal, round, and reactive to light Right Ear: External ear normal without discharge Left Ear: External ear normal without discharge Nose: Nose without discharge or deformity Mouth/Throat: Oropharynx is without other ulcerations and moist  Neck: Normal range of motion. Neck supple. No JVD present. No tracheal deviation present or significant neck LA or mass Cardiovascular: Normal rate, regular rhythm, normal heart sounds and intact distal pulses.   Pulmonary/Chest: WOB normal and breath sounds without rales or wheezing  Abdominal: Soft. Bowel sounds are normal. NT. No HSM  Musculoskeletal: Normal range of motion. Exhibits no edema Lymphadenopathy: Has no other cervical adenopathy.  Neurological: Pt is alert and oriented to person, place, and time. Pt has normal reflexes. No cranial nerve deficit. Motor grossly intact, Gait intact Skin: Skin is warm and dry. No rash noted or new ulcerations Psychiatric:  Has nervous mood and affect. Behavior is normal without agitation No other exam findings    Assessment & Plan:

## 2016-08-05 NOTE — Patient Instructions (Addendum)
Please take all new medication as prescribed - the trazodone 50 mg at bedtime as needed  Please continue all other medications as before, and refills have been done if requested.  Please have the pharmacy call with any other refills you may need.  Please continue your efforts at being more active, low cholesterol diet, and weight control.  You are otherwise up to date with prevention measures today.  Please keep your appointments with your specialists as you may have planned  Please go to the LAB in the Basement (turn left off the elevator) for the tests to be done today  You will be contacted by phone if any changes need to be made immediately.  Otherwise, you will receive a letter about your results with an explanation, but please check with MyChart first.  Please remember to sign up for MyChart if you have not done so, as this will be important to you in the future with finding out test results, communicating by private email, and scheduling acute appointments online when needed.  Please return in 1 year for your yearly visit, or sooner if needed, with Lab testing done 3-5 days before

## 2016-08-08 NOTE — Assessment & Plan Note (Signed)
Mild persistent, for change ambien to trazodone qhs prn,  to f/u any worsening symptoms or concerns

## 2016-08-08 NOTE — Assessment & Plan Note (Signed)
stable overall by history and exam, recent data reviewed with pt, and pt to continue medical treatment as before,  to f/u any worsening symptoms or concerns Lab Results  Component Value Date   HGBA1C 5.4 08/05/2016

## 2016-08-08 NOTE — Assessment & Plan Note (Signed)

## 2016-08-24 ENCOUNTER — Ambulatory Visit (INDEPENDENT_AMBULATORY_CARE_PROVIDER_SITE_OTHER): Payer: Medicare PPO | Admitting: Family

## 2016-08-24 DIAGNOSIS — M1711 Unilateral primary osteoarthritis, right knee: Secondary | ICD-10-CM

## 2016-08-24 DIAGNOSIS — M1712 Unilateral primary osteoarthritis, left knee: Secondary | ICD-10-CM

## 2016-08-24 DIAGNOSIS — M170 Bilateral primary osteoarthritis of knee: Secondary | ICD-10-CM | POA: Diagnosis not present

## 2016-08-24 MED ORDER — METHYLPREDNISOLONE ACETATE 40 MG/ML IJ SUSP
40.0000 mg | INTRAMUSCULAR | Status: AC | PRN
  Administered 2016-08-24: 40 mg via INTRA_ARTICULAR

## 2016-08-24 MED ORDER — LIDOCAINE HCL 1 % IJ SOLN
5.0000 mL | INTRAMUSCULAR | Status: AC | PRN
  Administered 2016-08-24: 5 mL

## 2016-08-24 NOTE — Progress Notes (Signed)
Office Visit Note   Patient: Joel King           Date of Birth: 1950-05-27           MRN: [id number] Visit Date: 08/24/2016              Requested by: Biagio Borg, MD [place], [place] [address] PCP: Biagio Borg, MD  No chief complaint on file.     HPI: The patient is a 66 year old gentleman who presents today complaining of bilateral knee pain associated with osteoarthritis. His last Depo-Medrol injection for the right knee was in December. Even longer for the left knee. States the pain has returned. The left knee was so painful with even keeping him up last night. He's been using topical agents with minimal relief. Requests repeat injection today.  Assessment & Plan: Visit Diagnoses:  1. Bilateral primary osteoarthritis of knee     Plan: Injection bilateral knees with Depo-Medrol today. He'll follow-up in office as needed.  Follow-Up Instructions: Return if symptoms worsen or fail to improve.   Right Knee Exam   Tenderness  The patient is experiencing tenderness in the medial joint line.  Range of Motion  The patient has normal right knee ROM.  Other  Erythema: absent Swelling: mild Other tests: no effusion present   Left Knee Exam   Tenderness  The patient is experiencing tenderness in the medial joint line.  Range of Motion  The patient has normal left knee ROM.  Other  Erythema: absent Swelling: mild Effusion: no effusion present      Patient is alert, oriented, no adenopathy, well-dressed, normal affect, normal respiratory effort.   Imaging: No results found.  Labs: Lab Results  Component Value Date   HGBA1C 5.4 08/05/2016   HGBA1C 5.5 12/09/2015   HGBA1C 5.4 03/17/2015    Orders:  No orders of the defined types were placed in this encounter.  No orders of the defined types were placed in this encounter.    Procedures: Large Joint Inj Date/Time: 08/24/2016 9:37 AM Performed by: Suzan Slick Authorized by: Dondra Prader R   Consent Given by:  Patient Site marked: the procedure site was marked   Timeout: prior to procedure the correct patient, procedure, and site was verified   Indications:  Pain and diagnostic evaluation Location:  Knee Site:  R knee Needle Size:  22 G Needle Length:  1.5 inches Ultrasound Guidance: No   Fluoroscopic Guidance: No   Arthrogram: No   Medications:  5 mL lidocaine 1 %; 40 mg methylPREDNISolone acetate 40 MG/ML Aspiration Attempted: No   Patient tolerance:  Patient tolerated the procedure well with no immediate complications Large Joint Inj Date/Time: 08/24/2016 9:37 AM Performed by: Suzan Slick Authorized by: Dondra Prader R   Consent Given by:  Patient Site marked: the procedure site was marked   Timeout: prior to procedure the correct patient, procedure, and site was verified   Indications:  Pain and diagnostic evaluation Location:  Knee Site:  L knee Needle Size:  22 G Needle Length:  1.5 inches Ultrasound Guidance: No   Fluoroscopic Guidance: No   Arthrogram: No   Medications:  5 mL lidocaine 1 %; 40 mg methylPREDNISolone acetate 40 MG/ML Aspiration Attempted: No   Patient tolerance:  Patient tolerated the procedure well with no immediate complications    Clinical Data: No additional findings.  ROS:  All other systems negative, except as noted  in the HPI. Review of Systems  Constitutional: Negative for chills and fever.  Cardiovascular: Positive for leg swelling.  Musculoskeletal: Positive for arthralgias and joint swelling.    Objective: Vital Signs: There were no vitals taken for this visit.  Specialty Comments:  No specialty comments available.  PMFS History: Patient Active Problem List   Diagnosis Date Noted  . Bilateral primary osteoarthritis of knee 08/24/2016  . Plantar fasciitis 03/16/2016  . Achilles tendinitis, right leg 03/16/2016  . Rash 01/15/2016  . Stasis dermatitis 01/15/2016  . Chronic  venous insufficiency 12/30/2015  . Cellulitis 12/30/2015  . Itching 12/09/2015  . Dizziness 03/25/2015  . Skin lesion of face 03/25/2015  . Abnormal liver function 03/25/2015  . Hypokalemia 03/25/2015  . Acute upper respiratory infection 01/14/2015  . Neuritis of right lower extremity 08/20/2014  . DVT (deep venous thrombosis) ([place]) 02/08/2014  . Right knee pain 12/22/2013  . Fatigue 11/02/2010  . Preventative health care 11/01/2010  . DEPRESSION 05/31/2008  . Diarrhea 05/31/2008  . SKIN LESION 12/08/2007  . Pre-diabetes 07/22/2007  . PEPTIC ULCER DISEASE 07/22/2007  . INSOMNIA-SLEEP DISORDER-UNSPEC 07/21/2007  . Hyperlipidemia 11/21/2006  . Anxiety state 11/21/2006  . OBESITY, MILD 11/17/2006  . Essential hypertension 11/17/2006   Past Medical History:  Diagnosis Date  . ANXIETY 11/21/2006  . DEPRESSION 05/31/2008  . DIABETES MELLITUS, TYPE II 07/22/2007  . Diarrhea 05/31/2008  . DVT (deep venous thrombosis) ([place]) 02/08/2014  . HYPERLIPIDEMIA 11/21/2006  . HYPERTENSION 11/17/2006  . INSOMNIA-SLEEP DISORDER-UNSPEC 07/21/2007  . OBESITY, MILD 11/17/2006  . PEPTIC ULCER DISEASE 07/22/2007  . PEPTIC ULCER DISEASE, HX OF 11/17/2006  . SINUSITIS- ACUTE-NOS 12/08/2007  . SKIN LESION 12/08/2007    Family History  Problem Relation Age of Onset  . Stroke Other   . Cancer Other        prostate    Past Surgical History:  Procedure Laterality Date  . inguinal herniorrhapy     bilat  . s/p incarcerated recurrent ventral hernia    . s/p meckel's diverticulum     Social History   Occupational History  . Not on file.   Social History Main Topics  . Smoking status: Never Smoker  . Smokeless tobacco: Never Used  . Alcohol use No  . Drug use: No  . Sexual activity: Not on file

## 2016-09-02 ENCOUNTER — Ambulatory Visit (INDEPENDENT_AMBULATORY_CARE_PROVIDER_SITE_OTHER): Payer: Medicare Other | Admitting: Orthopedic Surgery

## 2016-09-28 DIAGNOSIS — H25093 Other age-related incipient cataract, bilateral: Secondary | ICD-10-CM | POA: Diagnosis not present

## 2016-11-03 ENCOUNTER — Other Ambulatory Visit: Payer: Self-pay | Admitting: Internal Medicine

## 2016-12-13 ENCOUNTER — Ambulatory Visit (INDEPENDENT_AMBULATORY_CARE_PROVIDER_SITE_OTHER): Payer: Medicare PPO | Admitting: Orthopedic Surgery

## 2016-12-13 ENCOUNTER — Encounter (INDEPENDENT_AMBULATORY_CARE_PROVIDER_SITE_OTHER): Payer: Self-pay | Admitting: Orthopedic Surgery

## 2016-12-13 DIAGNOSIS — M25461 Effusion, right knee: Secondary | ICD-10-CM | POA: Diagnosis not present

## 2016-12-13 DIAGNOSIS — M170 Bilateral primary osteoarthritis of knee: Secondary | ICD-10-CM

## 2016-12-13 DIAGNOSIS — M1712 Unilateral primary osteoarthritis, left knee: Secondary | ICD-10-CM | POA: Diagnosis not present

## 2016-12-13 DIAGNOSIS — M1711 Unilateral primary osteoarthritis, right knee: Secondary | ICD-10-CM | POA: Diagnosis not present

## 2016-12-13 MED ORDER — LIDOCAINE HCL 1 % IJ SOLN
5.0000 mL | INTRAMUSCULAR | Status: AC | PRN
  Administered 2016-12-13: 5 mL

## 2016-12-13 MED ORDER — METHYLPREDNISOLONE ACETATE 40 MG/ML IJ SUSP
40.0000 mg | INTRAMUSCULAR | Status: AC | PRN
  Administered 2016-12-13: 40 mg via INTRA_ARTICULAR

## 2016-12-13 NOTE — Progress Notes (Signed)
Office Visit Note   Patient: Joel King           Date of Birth: [birth year]           MRN: [id number] Visit Date: 12/13/2016              Requested by: Biagio Borg, MD [place], [place] [address] PCP: Biagio Borg, MD  Chief Complaint  Patient presents with  . Left Knee - Pain  . Right Knee - Pain      HPI: Patient is a 66 year old gentleman who presents with increasing pain in both knees. Patient reports decreasing range of motion and swelling around the right knee. Patient states he has had good relief with previous steroid injections.  Assessment & Plan: Visit Diagnoses:  1. Bilateral primary osteoarthritis of knee   2. Effusion, right knee     Plan: Knees were aspirated and injected. Patient had improved range of motion. Plan to follow-up as needed.  Follow-Up Instructions: Return if symptoms worsen or fail to improve.   Ortho Exam  Patient is alert, oriented, no adenopathy, well-dressed, normal affect, normal respiratory effort. Examination patient has an antalgic gait he has decreased range of motion with the right knee he has a tense effusion of the right knee is tender to palpation of the medial lateral joint lines) cruciate are stable. Examination the left knee there is no effusion he is tender to palpation of the medial and lateral joint lines collaterals and cruciates are stable.  Imaging: No results found. No images are attached to the encounter.  Labs: Lab Results  Component Value Date   HGBA1C 5.4 08/05/2016   HGBA1C 5.5 12/09/2015   HGBA1C 5.4 03/17/2015    Orders:  No orders of the defined types were placed in this encounter.  No orders of the defined types were placed in this encounter.    Procedures: Large Joint Inj Date/Time: 12/13/2016 8:39 AM Performed by: DUDA, MARCUS V Authorized by: Newt Minion   Consent Given by:  Patient Site marked: the procedure site was marked   Timeout: prior to procedure the  correct patient, procedure, and site was verified   Indications:  Pain and diagnostic evaluation Location:  Knee Site:  R knee Prep: patient was prepped and draped in usual sterile fashion   Needle Size:  22 G Needle Length:  1.5 inches Approach:  Superolateral Ultrasound Guidance: No   Fluoroscopic Guidance: No   Arthrogram: No   Medications:  5 mL lidocaine 1 %; 40 mg methylPREDNISolone acetate 40 MG/ML Aspiration Attempted: No   Aspirate amount (mL):  50 Aspirate:  Clear Patient tolerance:  Patient tolerated the procedure well with no immediate complications Large Joint Inj Date/Time: 12/13/2016 8:40 AM Performed by: DUDA, MARCUS V Authorized by: Newt Minion   Consent Given by:  Patient Site marked: the procedure site was marked   Timeout: prior to procedure the correct patient, procedure, and site was verified   Indications:  Pain and diagnostic evaluation Location:  Knee Site:  L knee Prep: patient was prepped and draped in usual sterile fashion   Needle Size:  22 G Needle Length:  1.5 inches Approach:  Anteromedial Ultrasound Guidance: No   Fluoroscopic Guidance: No   Arthrogram: No   Medications:  5 mL lidocaine 1 %; 40 mg methylPREDNISolone acetate 40 MG/ML Aspiration Attempted: No   Patient tolerance:  Patient tolerated the procedure well with no immediate complications  Clinical Data: No additional findings.  ROS:  All other systems negative, except as noted in the HPI. Review of Systems  Objective: Vital Signs: There were no vitals taken for this visit.  Specialty Comments:  No specialty comments available.  PMFS History: Patient Active Problem List   Diagnosis Date Noted  . Bilateral primary osteoarthritis of knee 08/24/2016  . Plantar fasciitis 03/16/2016  . Achilles tendinitis, right leg 03/16/2016  . Rash 01/15/2016  . Stasis dermatitis 01/15/2016  . Chronic venous insufficiency 12/30/2015  . Cellulitis 12/30/2015  . Itching  12/09/2015  . Dizziness 03/25/2015  . Skin lesion of face 03/25/2015  . Abnormal liver function 03/25/2015  . Hypokalemia 03/25/2015  . Acute upper respiratory infection 01/14/2015  . Neuritis of right lower extremity 08/20/2014  . DVT (deep venous thrombosis) ([place]) 02/08/2014  . Right knee pain 12/22/2013  . Fatigue 11/02/2010  . Preventative health care 11/01/2010  . DEPRESSION 05/31/2008  . Diarrhea 05/31/2008  . SKIN LESION 12/08/2007  . Pre-diabetes 07/22/2007  . PEPTIC ULCER DISEASE 07/22/2007  . INSOMNIA-SLEEP DISORDER-UNSPEC 07/21/2007  . Hyperlipidemia 11/21/2006  . Anxiety state 11/21/2006  . OBESITY, MILD 11/17/2006  . Essential hypertension 11/17/2006   Past Medical History:  Diagnosis Date  . ANXIETY 11/21/2006  . DEPRESSION 05/31/2008  . DIABETES MELLITUS, TYPE II 07/22/2007  . Diarrhea 05/31/2008  . DVT (deep venous thrombosis) ([place]) 02/08/2014  . HYPERLIPIDEMIA 11/21/2006  . HYPERTENSION 11/17/2006  . INSOMNIA-SLEEP DISORDER-UNSPEC 07/21/2007  . OBESITY, MILD 11/17/2006  . PEPTIC ULCER DISEASE 07/22/2007  . PEPTIC ULCER DISEASE, HX OF 11/17/2006  . SINUSITIS- ACUTE-NOS 12/08/2007  . SKIN LESION 12/08/2007    Family History  Problem Relation Age of Onset  . Stroke Other   . Cancer Other        prostate    Past Surgical History:  Procedure Laterality Date  . inguinal herniorrhapy     bilat  . s/p incarcerated recurrent ventral hernia    . s/p meckel's diverticulum     Social History   Occupational History  . Not on file.   Social History Main Topics  . Smoking status: Never Smoker  . Smokeless tobacco: Never Used  . Alcohol use No  . Drug use: No  . Sexual activity: Not on file

## 2017-01-03 DIAGNOSIS — C44319 Basal cell carcinoma of skin of other parts of face: Secondary | ICD-10-CM | POA: Diagnosis not present

## 2017-03-24 DIAGNOSIS — B078 Other viral warts: Secondary | ICD-10-CM | POA: Diagnosis not present

## 2017-03-24 DIAGNOSIS — L570 Actinic keratosis: Secondary | ICD-10-CM | POA: Diagnosis not present

## 2017-03-24 DIAGNOSIS — X32XXXD Exposure to sunlight, subsequent encounter: Secondary | ICD-10-CM | POA: Diagnosis not present

## 2017-04-12 DIAGNOSIS — C44319 Basal cell carcinoma of skin of other parts of face: Secondary | ICD-10-CM | POA: Diagnosis not present

## 2017-04-16 ENCOUNTER — Other Ambulatory Visit: Payer: Self-pay | Admitting: Internal Medicine

## 2017-05-24 ENCOUNTER — Telehealth (INDEPENDENT_AMBULATORY_CARE_PROVIDER_SITE_OTHER): Payer: Self-pay | Admitting: Orthopedic Surgery

## 2017-05-24 NOTE — Telephone Encounter (Signed)
FYI patient requesting cortisone injections in both knees at his appt made for 06/13/17.

## 2017-05-24 NOTE — Telephone Encounter (Signed)
Pt to discuss treatment with Dr. Sharol Given at time of appt.

## 2017-06-13 ENCOUNTER — Encounter (INDEPENDENT_AMBULATORY_CARE_PROVIDER_SITE_OTHER): Payer: Self-pay | Admitting: Orthopedic Surgery

## 2017-06-13 ENCOUNTER — Ambulatory Visit (INDEPENDENT_AMBULATORY_CARE_PROVIDER_SITE_OTHER): Payer: Medicare PPO | Admitting: Orthopedic Surgery

## 2017-06-13 VITALS — Ht 66.0 in | Wt 250.0 lb

## 2017-06-13 DIAGNOSIS — M170 Bilateral primary osteoarthritis of knee: Secondary | ICD-10-CM

## 2017-06-13 DIAGNOSIS — M1711 Unilateral primary osteoarthritis, right knee: Secondary | ICD-10-CM

## 2017-06-13 DIAGNOSIS — M1712 Unilateral primary osteoarthritis, left knee: Secondary | ICD-10-CM

## 2017-06-13 NOTE — Progress Notes (Signed)
Office Visit Note   Patient: Joel King           Date of Birth: 08/29/1950           MRN: [id number] Visit Date: 06/13/2017              Requested by: Biagio Borg, MD [address] [place], [place] [address] PCP: Biagio Borg, MD  Chief Complaint  Patient presents with  . Right Knee - Pain  . Left Knee - Pain      HPI: Patient is a 67 year old gentleman with osteoarthritis both knees and venous insufficiency both lower extremities.  Patient is currently wearing medical compression stocking.  He states he is prediabetic his hemoglobin A1c is 5.4.  Patient has had steroid injections for his knees in the past he states that these have lasted about 5 months.  Assessment & Plan: Visit Diagnoses:  1. Bilateral primary osteoarthritis of knee     Plan: Both knees were injected without complications he will continue with his compression stockings.  Follow-Up Instructions: Return if symptoms worsen or fail to improve.   Ortho Exam  Patient is alert, oriented, no adenopathy, well-dressed, normal affect, normal respiratory effort. Examination patient has an antalgic gait.  Is a mild effusion of both knees he is tender to palpation over the medial and lateral joint lines bilaterally collaterals and cruciates are stable.  He is crepitation range of motion of both knees.  Imaging: No results found. No images are attached to the encounter.  Labs: Lab Results  Component Value Date   HGBA1C 5.4 08/05/2016   HGBA1C 5.5 12/09/2015   HGBA1C 5.4 03/17/2015    @LABSALLVALUES (HGBA1)@  Body mass index is 40.35 kg/m.  Orders:  No orders of the defined types were placed in this encounter.  No orders of the defined types were placed in this encounter.    Procedures: Large Joint Inj: bilateral knee on 06/13/2017 9:12 AM Indications: pain and diagnostic evaluation Details: 22 G 1.5 in needle, anteromedial approach  Arthrogram: No  Outcome: tolerated well, no  immediate complications Procedure, treatment alternatives, risks and benefits explained, specific risks discussed. Consent was given by the patient. Immediately prior to procedure a time out was called to verify the correct patient, procedure, equipment, support staff and site/side marked as required. Patient was prepped and draped in the usual sterile fashion.      Clinical Data: No additional findings.  ROS:  All other systems negative, except as noted in the HPI. Review of Systems  Objective: Vital Signs: Ht 5\' 6"  (1.676 m)   Wt 250 lb (113.4 kg)   BMI 40.35 kg/m   Specialty Comments:  No specialty comments available.  PMFS History: Patient Active Problem List   Diagnosis Date Noted  . Effusion, right knee 12/13/2016  . Bilateral primary osteoarthritis of knee 08/24/2016  . Plantar fasciitis 03/16/2016  . Achilles tendinitis, right leg 03/16/2016  . Rash 01/15/2016  . Stasis dermatitis 01/15/2016  . Chronic venous insufficiency 12/30/2015  . Cellulitis 12/30/2015  . Itching 12/09/2015  . Dizziness 03/25/2015  . Skin lesion of face 03/25/2015  . Abnormal liver function 03/25/2015  . Hypokalemia 03/25/2015  . Acute upper respiratory infection 01/14/2015  . Neuritis of right lower extremity 08/20/2014  . DVT (deep venous thrombosis) ([place]) 02/08/2014  . Right knee pain 12/22/2013  . Fatigue 11/02/2010  . Preventative health care 11/01/2010  . DEPRESSION 05/31/2008  . Diarrhea 05/31/2008  . SKIN LESION  12/08/2007  . Pre-diabetes 07/22/2007  . PEPTIC ULCER DISEASE 07/22/2007  . INSOMNIA-SLEEP DISORDER-UNSPEC 07/21/2007  . Hyperlipidemia 11/21/2006  . Anxiety state 11/21/2006  . OBESITY, MILD 11/17/2006  . Essential hypertension 11/17/2006   Past Medical History:  Diagnosis Date  . ANXIETY 11/21/2006  . DEPRESSION 05/31/2008  . DIABETES MELLITUS, TYPE II 07/22/2007  . Diarrhea 05/31/2008  . DVT (deep venous thrombosis) ([place]) 02/08/2014  . HYPERLIPIDEMIA 11/21/2006    . HYPERTENSION 11/17/2006  . INSOMNIA-SLEEP DISORDER-UNSPEC 07/21/2007  . OBESITY, MILD 11/17/2006  . PEPTIC ULCER DISEASE 07/22/2007  . PEPTIC ULCER DISEASE, HX OF 11/17/2006  . SINUSITIS- ACUTE-NOS 12/08/2007  . SKIN LESION 12/08/2007    Family History  Problem Relation Age of Onset  . Stroke Other   . Cancer Other        prostate    Past Surgical History:  Procedure Laterality Date  . inguinal herniorrhapy     bilat  . s/p incarcerated recurrent ventral hernia    . s/p meckel's diverticulum     Social History   Occupational History  . Not on file  Tobacco Use  . Smoking status: Never Smoker  . Smokeless tobacco: Never Used  Substance and Sexual Activity  . Alcohol use: No  . Drug use: No  . Sexual activity: Not on file

## 2017-07-20 ENCOUNTER — Encounter: Payer: Self-pay | Admitting: Internal Medicine

## 2017-08-12 ENCOUNTER — Encounter: Payer: Self-pay | Admitting: Internal Medicine

## 2017-08-12 ENCOUNTER — Ambulatory Visit (INDEPENDENT_AMBULATORY_CARE_PROVIDER_SITE_OTHER): Payer: Medicare PPO | Admitting: Internal Medicine

## 2017-08-12 ENCOUNTER — Telehealth: Payer: Self-pay

## 2017-08-12 ENCOUNTER — Other Ambulatory Visit (INDEPENDENT_AMBULATORY_CARE_PROVIDER_SITE_OTHER): Payer: Medicare PPO

## 2017-08-12 ENCOUNTER — Other Ambulatory Visit: Payer: Self-pay | Admitting: Internal Medicine

## 2017-08-12 VITALS — BP 120/78 | HR 61 | Temp 98.3°F | Ht 66.0 in | Wt 246.0 lb

## 2017-08-12 DIAGNOSIS — R7303 Prediabetes: Secondary | ICD-10-CM

## 2017-08-12 DIAGNOSIS — Z0000 Encounter for general adult medical examination without abnormal findings: Secondary | ICD-10-CM

## 2017-08-12 DIAGNOSIS — N401 Enlarged prostate with lower urinary tract symptoms: Secondary | ICD-10-CM

## 2017-08-12 DIAGNOSIS — R3914 Feeling of incomplete bladder emptying: Secondary | ICD-10-CM

## 2017-08-12 DIAGNOSIS — R972 Elevated prostate specific antigen [PSA]: Secondary | ICD-10-CM

## 2017-08-12 LAB — BASIC METABOLIC PANEL
BUN: 17 mg/dL (ref 6–23)
CHLORIDE: 99 meq/L (ref 96–112)
CO2: 32 meq/L (ref 19–32)
CREATININE: 0.86 mg/dL (ref 0.40–1.50)
Calcium: 9.9 mg/dL (ref 8.4–10.5)
GFR: 94.23 mL/min (ref >60.00)
Glucose, Bld: 124 mg/dL — ABNORMAL HIGH (ref 70–99)
Potassium: 3.4 mEq/L — ABNORMAL LOW (ref 3.5–5.1)
Sodium: 140 mEq/L (ref 135–145)

## 2017-08-12 LAB — HEPATIC FUNCTION PANEL
ALK PHOS: 63 U/L (ref 39–117)
ALT: 20 U/L (ref 0–53)
AST: 18 U/L (ref 0–37)
Albumin: 4.7 g/dL (ref 3.5–5.2)
BILIRUBIN DIRECT: 0.2 mg/dL (ref 0.0–0.3)
BILIRUBIN TOTAL: 0.8 mg/dL (ref 0.2–1.2)
Total Protein: 7.8 g/dL (ref 6.0–8.3)

## 2017-08-12 LAB — PSA: PSA: 5.94 ng/mL — ABNORMAL HIGH (ref 0.10–4.00)

## 2017-08-12 LAB — CBC WITH DIFFERENTIAL/PLATELET
BASOS ABS: 0 10*3/uL (ref 0.0–0.1)
Basophils Relative: 0.3 % (ref 0.0–3.0)
EOS ABS: 0.3 10*3/uL (ref 0.0–0.7)
Eosinophils Relative: 2.3 % (ref 0.0–5.0)
HCT: 49.2 % (ref 39.0–52.0)
Hemoglobin: 17.3 g/dL — ABNORMAL HIGH (ref 13.0–17.0)
LYMPHS ABS: 1.2 10*3/uL (ref 0.7–4.0)
Lymphocytes Relative: 10.8 % — ABNORMAL LOW (ref 12.0–46.0)
MCHC: 35.2 g/dL (ref 30.0–36.0)
MCV: 91.7 fl (ref 78.0–100.0)
MONO ABS: 1 10*3/uL (ref 0.1–1.0)
MONOS PCT: 9 % (ref 3.0–12.0)
NEUTROS PCT: 77.6 % — AB (ref 43.0–77.0)
Neutro Abs: 8.7 10*3/uL — ABNORMAL HIGH (ref 1.4–7.7)
Platelets: 219 10*3/uL (ref 150.0–400.0)
RBC: 5.36 Mil/uL (ref 4.22–5.81)
RDW: 13.3 % (ref 11.5–15.5)
WBC: 11.2 10*3/uL — ABNORMAL HIGH (ref 4.0–10.5)

## 2017-08-12 LAB — URINALYSIS, ROUTINE W REFLEX MICROSCOPIC
BILIRUBIN URINE: NEGATIVE
HGB URINE DIPSTICK: NEGATIVE
KETONES UR: NEGATIVE
NITRITE: NEGATIVE
Specific Gravity, Urine: 1.02 (ref 1.000–1.030)
TOTAL PROTEIN, URINE-UPE24: 30 — AB
URINE GLUCOSE: NEGATIVE
Urobilinogen, UA: 0.2 (ref 0.0–1.0)
pH: 8 (ref 5.0–8.0)

## 2017-08-12 LAB — LIPID PANEL
CHOL/HDL RATIO: 5
Cholesterol: 159 mg/dL (ref 0–200)
HDL: 34.2 mg/dL — ABNORMAL LOW (ref >39.00)
LDL CALC: 91 mg/dL (ref 0–99)
NONHDL: 125.03
TRIGLYCERIDES: 172 mg/dL — AB (ref 0.0–149.0)
VLDL: 34.4 mg/dL (ref 0.0–40.0)

## 2017-08-12 LAB — HEMOGLOBIN A1C: Hgb A1c MFr Bld: 5.6 % (ref 4.6–6.5)

## 2017-08-12 LAB — TSH: TSH: 1.57 u[IU]/mL (ref 0.35–4.50)

## 2017-08-12 MED ORDER — FINASTERIDE 5 MG PO TABS: 5.0000 mg | ORAL_TABLET | Freq: Every day | ORAL | 3 refills | Status: DC

## 2017-08-12 MED ORDER — BENAZEPRIL HCL 40 MG PO TABS: 40.0000 mg | ORAL_TABLET | Freq: Every day | ORAL | 3 refills | Status: DC

## 2017-08-12 MED ORDER — ATENOLOL-CHLORTHALIDONE 100-25 MG PO TABS
ORAL_TABLET | ORAL | 3 refills | Status: DC
Start: 2017-08-12 — End: 2018-08-28

## 2017-08-12 MED ORDER — SIMVASTATIN 20 MG PO TABS: ORAL_TABLET | ORAL | 3 refills | Status: DC

## 2017-08-12 MED ORDER — POTASSIUM CHLORIDE ER 10 MEQ PO TBCR
EXTENDED_RELEASE_TABLET | ORAL | 3 refills | Status: DC
Start: 2017-08-12 — End: 2018-10-17

## 2017-08-12 MED ORDER — AMLODIPINE BESYLATE 10 MG PO TABS: 10.0000 mg | ORAL_TABLET | Freq: Every day | ORAL | 3 refills | Status: DC

## 2017-08-12 MED ORDER — FINASTERIDE 5 MG PO TABS
5.0000 mg | ORAL_TABLET | Freq: Every day | ORAL | 3 refills | Status: DC
Start: 2017-08-12 — End: 2017-08-12

## 2017-08-12 MED ORDER — ZOSTER VAC RECOMB ADJUVANTED 50 MCG/0.5ML IM SUSR: 0.5000 mL | Freq: Once | INTRAMUSCULAR | 1 refills | Status: AC

## 2017-08-12 NOTE — Progress Notes (Signed)
Subjective:    Patient ID: Joel King, male    DOB: September 04, 1950, 67 y.o.   MRN: [id number]  HPI  Here for wellness and f/u;  Overall doing ok;  Pt denies Chest pain, worsening SOB, DOE, wheezing, orthopnea, PND, worsening LE edema, palpitations, dizziness or syncope.  Pt denies neurological change such as new headache, facial or extremity weakness.  Pt denies polydipsia, polyuria, or low sugar symptoms. Pt states overall good compliance with treatment and medications, good tolerability, and has been trying to follow appropriate diet.  Pt denies worsening depressive symptoms, suicidal ideation or panic. No fever, night sweats, wt loss, loss of appetite, or other constitutional symptoms.  Pt states good ability with ADL's, has low fall risk, home safety reviewed and adequate, no other significant changes in hearing or vision, and only occasionally active with exercise  Has colonoscopy scheduled for July 1 with Dr Henrene Pastor. S/p basal cell ca left face removed jan 2019.   Has stable chronic LE venous insuff bilat LE no change.  BP at home > 140/90.  Denies urinary symptoms such as dysuria, frequency, urgency, flank pain, hematuria or n/v, fever, chills,. But has reduced flow and straining Past Medical History:  Diagnosis Date  . ANXIETY 11/21/2006  . DEPRESSION 05/31/2008  . DIABETES MELLITUS, TYPE II 07/22/2007  . Diarrhea 05/31/2008  . DVT (deep venous thrombosis) ([place]) 02/08/2014  . HYPERLIPIDEMIA 11/21/2006  . HYPERTENSION 11/17/2006  . INSOMNIA-SLEEP DISORDER-UNSPEC 07/21/2007  . OBESITY, MILD 11/17/2006  . PEPTIC ULCER DISEASE 07/22/2007  . PEPTIC ULCER DISEASE, HX OF 11/17/2006  . SINUSITIS- ACUTE-NOS 12/08/2007  . SKIN LESION 12/08/2007   Past Surgical History:  Procedure Laterality Date  . inguinal herniorrhapy     bilat  . s/p incarcerated recurrent ventral hernia    . s/p meckel's diverticulum      reports that he has never smoked. He has never used smokeless tobacco. He reports that he  does not drink alcohol or use drugs. family history includes Cancer in his other; Stroke in his other. Allergies  Allergen Reactions  . Atorvastatin   . Pravastatin Sodium   . Rosuvastatin    Current Outpatient Medications on File Prior to Visit  Medication Sig Dispense Refill  . aspirin 81 MG tablet Take 81 mg by mouth daily.      . clobetasol cream (TEMOVATE) 7.74 % Apply 1 application topically 2 (two) times daily. 30 g 1   No current facility-administered medications on file prior to visit.    Review of Systems Constitutional: Negative for other unusual diaphoresis, sweats, appetite or weight changes HENT: Negative for other worsening hearing loss, ear pain, facial swelling, mouth sores or neck stiffness.   Eyes: Negative for other worsening pain, redness or other visual disturbance.  Respiratory: Negative for other stridor or swelling Cardiovascular: Negative for other palpitations or other chest pain  Gastrointestinal: Negative for worsening diarrhea or loose stools, blood in stool, distention or other pain Genitourinary: Negative for hematuria, flank pain or other change in urine volume.  Musculoskeletal: Negative for myalgias or other joint swelling.  Skin: Negative for other color change, or other wound or worsening drainage.  Neurological: Negative for other syncope or numbness. Hematological: Negative for other adenopathy or swelling Psychiatric/Behavioral: Negative for hallucinations, other worsening agitation, SI, self-injury, or new decreased concentration All other system neg per pt    Objective:   Physical Exam BP 120/78   Pulse 61   Temp 98.3 F (36.8 C) (Oral)  Ht 5\' 6"  (1.676 m)   Wt 246 lb (111.6 kg)   SpO2 97%   BMI 39.71 kg/m  VS noted,  Constitutional: Pt is oriented to person, place, and time. Appears well-developed and well-nourished, in no significant distress and comfortable Head: Normocephalic and atraumatic  Eyes: Conjunctivae and EOM are  normal. Pupils are equal, round, and reactive to light Right Ear: External ear normal without discharge Left Ear: External ear normal without discharge Nose: Nose without discharge or deformity Mouth/Throat: Oropharynx is without other ulcerations and moist  Neck: Normal range of motion. Neck supple. No JVD present. No tracheal deviation present or significant neck LA or mass Cardiovascular: Normal rate, regular rhythm, normal heart sounds and intact distal pulses.   Pulmonary/Chest: WOB normal and breath sounds without rales or wheezing  Abdominal: Soft. Bowel sounds are normal. NT. No HSM  Musculoskeletal: Normal range of motion. Exhibits no edema Lymphadenopathy: Has no other cervical adenopathy.  Neurological: Pt is alert and oriented to person, place, and time. Pt has normal reflexes. No cranial nerve deficit. Motor grossly intact, Gait intact Skin: Skin is warm and dry. No rash noted or new ulcerations Psychiatric:  Has normal mood and affect. Behavior is normal without agitation No other exam findings    Assessment & Plan:

## 2017-08-12 NOTE — Telephone Encounter (Signed)
Called pt, LVM   CRM Created  

## 2017-08-12 NOTE — Telephone Encounter (Signed)
-----   Message from Biagio Borg, MD sent at 08/12/2017 12:33 PM EDT ----- Left message on MyChart, pt to cont same tx except  The test results show that your current treatment is OK, except the PSA is now elevated as we suspected could be the case.  This may not actually be prostate cancer, but we should refer you to Urology for further consideration.  Shirron to please inform pt, I will do referral

## 2017-08-12 NOTE — Patient Instructions (Addendum)
Your Shingles shot prescription was sent to [facility]  Please take all new medication as prescribed - the Proscar 5 mg per day  Please continue all other medications as before, and refills have been done if requested.  Please have the pharmacy call with any other refills you may need.  Please continue your efforts at being more active, low cholesterol diet, and weight control.  You are otherwise up to date with prevention measures today.  Please keep your appointments with your specialists as you may have planned  Please go to the LAB in the Basement (turn left off the elevator) for the tests to be done today  You will be contacted by phone if any changes need to be made immediately.  Otherwise, you will receive a letter about your results with an explanation, but please check with MyChart first.  Please return in 1 year for your yearly visit, or sooner if needed, with Lab testing done 3-5 days before

## 2017-08-13 ENCOUNTER — Encounter: Payer: Self-pay | Admitting: Internal Medicine

## 2017-08-13 DIAGNOSIS — N400 Enlarged prostate without lower urinary tract symptoms: Secondary | ICD-10-CM | POA: Insufficient documentation

## 2017-08-13 NOTE — Assessment & Plan Note (Signed)
To start proscar 5 qd

## 2017-08-13 NOTE — Assessment & Plan Note (Signed)
Lab Results  Component Value Date   HGBA1C 5.6 08/12/2017  stable overall by history and exam, recent data reviewed with pt, and pt to continue medical treatment as before,  to f/u any worsening symptoms or concerns

## 2017-08-13 NOTE — Assessment & Plan Note (Signed)

## 2017-09-13 ENCOUNTER — Encounter: Payer: Self-pay | Admitting: Internal Medicine

## 2017-09-13 ENCOUNTER — Ambulatory Visit (AMBULATORY_SURGERY_CENTER): Payer: Self-pay

## 2017-09-13 VITALS — Ht 67.5 in | Wt 251.2 lb

## 2017-09-13 DIAGNOSIS — Z1211 Encounter for screening for malignant neoplasm of colon: Secondary | ICD-10-CM

## 2017-09-13 MED ORDER — NA SULFATE-K SULFATE-MG SULF 17.5-3.13-1.6 GM/177ML PO SOLN: 1.0000 | Freq: Once | ORAL | 0 refills | Status: AC

## 2017-09-13 NOTE — Progress Notes (Signed)
Per pt, no allergies to soy or egg products.Pt not taking any weight loss meds or using  O2 at home.  Pt refused emmi video.  During the PV- pt was very anxious and asked many questions. I went over the instructions several times and spent over 45 minutes with the pt The pt states he does not want the driver to hear the results of the colon. Informed pt he could request the results be sealed in an envelope.

## 2017-09-23 DIAGNOSIS — M79676 Pain in unspecified toe(s): Secondary | ICD-10-CM

## 2017-09-26 ENCOUNTER — Encounter: Payer: Self-pay | Admitting: Internal Medicine

## 2017-09-26 ENCOUNTER — Ambulatory Visit (AMBULATORY_SURGERY_CENTER): Payer: Medicare PPO | Admitting: Internal Medicine

## 2017-09-26 ENCOUNTER — Other Ambulatory Visit: Payer: Self-pay

## 2017-09-26 VITALS — BP 113/83 | HR 56 | Temp 98.2°F | Resp 13 | Ht 67.0 in | Wt 251.0 lb

## 2017-09-26 DIAGNOSIS — D123 Benign neoplasm of transverse colon: Secondary | ICD-10-CM

## 2017-09-26 DIAGNOSIS — K219 Gastro-esophageal reflux disease without esophagitis: Secondary | ICD-10-CM | POA: Diagnosis not present

## 2017-09-26 DIAGNOSIS — I10 Essential (primary) hypertension: Secondary | ICD-10-CM | POA: Diagnosis not present

## 2017-09-26 DIAGNOSIS — Z1211 Encounter for screening for malignant neoplasm of colon: Secondary | ICD-10-CM

## 2017-09-26 MED ORDER — SODIUM CHLORIDE 0.9 % IV SOLN: 500.0000 mL | Freq: Once | INTRAVENOUS | Status: DC

## 2017-09-26 NOTE — Progress Notes (Signed)
Called to room to assist during endoscopic procedure.  Patient ID and intended procedure confirmed with present staff. Received instructions for my participation in the procedure from the performing physician.  

## 2017-09-26 NOTE — Patient Instructions (Signed)
One polyp today, removed.  Handouts given on polyps and hemorrhoids.  YOU HAD AN ENDOSCOPIC PROCEDURE TODAY AT [facility]:   Refer to the procedure report that was given to you for any specific questions about what was found during the examination.  If the procedure report does not answer your questions, please call your gastroenterologist to clarify.  If you requested that your care partner not be given the details of your procedure findings, then the procedure report has been included in a sealed envelope for you to review at your convenience later.  YOU SHOULD EXPECT: Some feelings of bloating in the abdomen. Passage of more gas than usual.  Walking can help get rid of the air that was put into your GI tract during the procedure and reduce the bloating. If you had a lower endoscopy (such as a colonoscopy or flexible sigmoidoscopy) you may notice spotting of blood in your stool or on the toilet paper. If you underwent a bowel prep for your procedure, you may not have a normal bowel movement for a few days.  Please Note:  You might notice some irritation and congestion in your nose or some drainage.  This is from the oxygen used during your procedure.  There is no need for concern and it should clear up in a day or so.  SYMPTOMS TO REPORT IMMEDIATELY:   Following lower endoscopy (colonoscopy or flexible sigmoidoscopy):  Excessive amounts of blood in the stool  Significant tenderness or worsening of abdominal pains  Swelling of the abdomen that is new, acute  Fever of 100F or higher   For urgent or emergent issues, a gastroenterologist can be reached at any hour by calling [phone].   DIET:  We do recommend a small meal at first, but then you may proceed to your regular diet.  Drink plenty of fluids but you should avoid alcoholic beverages for 24 hours.  ACTIVITY:  You should plan to take it easy for the rest of today and you should NOT DRIVE or use heavy machinery  until tomorrow (because of the sedation medicines used during the test).    FOLLOW UP: Our staff will call the number listed on your records the next business day following your procedure to check on you and address any questions or concerns that you may have regarding the information given to you following your procedure. If we do not reach you, we will leave a message.  However, if you are feeling well and you are not experiencing any problems, there is no need to return our call.  We will assume that you have returned to your regular daily activities without incident.  If any biopsies were taken you will be contacted by phone or by letter within the next 1-3 weeks.  Please call us at [phone] if you have not heard about the biopsies in 3 weeks.    SIGNATURES/CONFIDENTIALITY: You and/or your care partner have signed paperwork which will be entered into your electronic medical record.  These signatures attest to the fact that that the information above on your After Visit Summary has been reviewed and is understood.  Full responsibility of the confidentiality of this discharge information lies with you and/or your care-partner.

## 2017-09-26 NOTE — Progress Notes (Signed)
Report to PACU, RN, vss, BBS= Clear.  

## 2017-09-26 NOTE — Progress Notes (Signed)
Pt's states no medical or surgical changes since previsit or office visit. 

## 2017-09-26 NOTE — Op Note (Signed)
[place] Patient Name: Joel King Procedure Date: 09/26/2017 8:36 AM MRN: [id number] Endoscopist: Docia Chuck. Henrene Pastor , MD Age: 67 Referring MD:  Date of Birth: [birth year] Gender: Male Account #: [id number] Procedure:                Colonoscopy, with cold snare polypectomy x 1 Indications:              Screening for colorectal malignant neoplasm Medicines:                Monitored Anesthesia Care Procedure:                Pre-Anesthesia Assessment:                           - Prior to the procedure, a History and Physical                            was performed, and patient medications and                            allergies were reviewed. The patient's tolerance of                            previous anesthesia was also reviewed. The risks                            and benefits of the procedure and the sedation                            options and risks were discussed with the patient.                            All questions were answered, and informed consent                            was obtained. Prior Anticoagulants: The patient has                            taken no previous anticoagulant or antiplatelet                            agents. ASA Grade Assessment: II - A patient with                            mild systemic disease. After reviewing the risks                            and benefits, the patient was deemed in                            satisfactory condition to undergo the procedure.                           After obtaining informed consent, the colonoscope  was passed under direct vision. Throughout the                            procedure, the patient's blood pressure, pulse, and                            oxygen saturations were monitored continuously. The                            Colonoscope was introduced through the anus and                            advanced to the the cecum, identified by      appendiceal orifice and ileocecal valve. The                            ileocecal valve, appendiceal orifice, and rectum                            were photographed. The quality of the bowel                            preparation was good. The colonoscopy was performed                            without difficulty. The patient tolerated the                            procedure well. The bowel preparation used was                            SUPREP. Scope In: 8:48:18 AM Scope Out: 9:06:07 AM Scope Withdrawal Time: 0 hours 14 minutes 59 seconds  Total Procedure Duration: 0 hours 17 minutes 49 seconds  Findings:                 A 5 mm polyp was found in the proximal transverse                            colon. The polyp was removed with a cold snare.                            Resection and retrieval were complete.                           Internal hemorrhoids were found during retroflexion.                           The exam was otherwise without abnormality on                            direct and retroflexion views. Complications:            No immediate complications. Estimated blood loss:  None. Estimated Blood Loss:     Estimated blood loss: none. Impression:               - One 5 mm polyp in the proximal transverse colon,                            removed with a cold snare. Resected and retrieved.                           - Internal hemorrhoids.                           - The examination was otherwise normal on direct                            and retroflexion views. Recommendation:           - Repeat colonoscopy in 5 years for surveillance.                           - Patient has a contact number available for                            emergencies. The signs and symptoms of potential                            delayed complications were discussed with the                            patient. Return to normal activities tomorrow.                             Written discharge instructions were provided to the                            patient.                           - Resume previous diet.                           - Continue present medications.                           - Await pathology results. Docia Chuck. Henrene Pastor, MD 09/26/2017 9:09:23 AM This report has been signed electronically.

## 2017-09-27 ENCOUNTER — Telehealth: Payer: Self-pay

## 2017-09-27 NOTE — Telephone Encounter (Signed)
  Follow up Call-  Call back number 09/26/2017  Post procedure Call Back phone  # [phone]  Permission to leave phone message Yes  Some recent data might be hidden     Patient questions:  Do you have a fever, pain , or abdominal swelling? No. Pain Score  0 *  Have you tolerated food without any problems? Yes.    Have you been able to return to your normal activities? Yes.    Do you have any questions about your discharge instructions: Diet   No. Medications  No. Follow up visit  No.  Do you have questions or concerns about your Care? No.  Actions: * If pain score is 4 or above: No action needed, pain <4.

## 2017-10-03 ENCOUNTER — Encounter: Payer: Self-pay | Admitting: Internal Medicine

## 2017-10-14 DIAGNOSIS — D123 Benign neoplasm of transverse colon: Secondary | ICD-10-CM | POA: Diagnosis not present

## 2017-10-20 ENCOUNTER — Telehealth: Payer: Self-pay | Admitting: Podiatry

## 2017-10-20 NOTE — Telephone Encounter (Signed)
Pt called checking on status of refurbished orthotics he dropped off on 6.28.19. I told pt I would discuss with you and call him back this afternoon.  I called [facility] where the order was placed and they never received the orthotics to refurbish and they do not have any original molds for pt. I also have left message for [employer] to check to see if they got them.

## 2017-10-20 NOTE — Telephone Encounter (Signed)
Left message for pt that the orthotics he brought in to be refurbished have been lost in the mail. We will need a new mold of his foot and to please call me to schedule a quick appt and we will have a new pair made and have them rushed.

## 2017-10-25 NOTE — Telephone Encounter (Signed)
Pt returned call and is scheduled to see Liliane Channel on 8.19.19 to be recast for orthotics that were to be refurbished.Marland Kitchen

## 2017-11-03 ENCOUNTER — Encounter (INDEPENDENT_AMBULATORY_CARE_PROVIDER_SITE_OTHER): Payer: Self-pay | Admitting: Orthopedic Surgery

## 2017-11-03 ENCOUNTER — Ambulatory Visit (INDEPENDENT_AMBULATORY_CARE_PROVIDER_SITE_OTHER): Payer: Medicare PPO | Admitting: Orthopedic Surgery

## 2017-11-03 VITALS — Ht 67.0 in | Wt 251.0 lb

## 2017-11-03 DIAGNOSIS — M170 Bilateral primary osteoarthritis of knee: Secondary | ICD-10-CM | POA: Diagnosis not present

## 2017-11-03 DIAGNOSIS — Z6841 Body Mass Index (BMI) 40.0 and over, adult: Secondary | ICD-10-CM | POA: Diagnosis not present

## 2017-11-03 NOTE — Progress Notes (Signed)
Office Visit Note   Patient: Joel King           Date of Birth: February 23, 1951           MRN: [id number] Visit Date: 11/03/2017              Requested by: Biagio Borg, MD [place], [place] [id number] PCP: Biagio Borg, MD  Chief Complaint  Patient presents with  . Left Knee - Pain    Last injection 05/2017  . Right Knee - Pain      HPI: Patient is a 67 year old gentleman with osteoarthritis bilateral knees.  He is about 6 months status post previous injection which provided him good temporary relief.  Assessment & Plan: Visit Diagnoses:  1. Bilateral primary osteoarthritis of knee   2. Body mass index 40.0-44.9, adult ([place])   3. Morbid obesity ([place])     Plan: Both knees were injected he tolerated this well.  Patient inquired regarding hyaluronic acid injection.  He will call us if he wants to pursue consideration for hyaluronic acid injections.  Follow-Up Instructions: Return if symptoms worsen or fail to improve.   Ortho Exam  Patient is alert, oriented, no adenopathy, well-dressed, normal affect, normal respiratory effort. Examination patient is slow to get from a sitting to a standing position he has an antalgic gait.  He has swelling and edema around both knees but no redness no cellulitis there is crepitation with range of motion there is tenderness to palpation along medial and lateral joint lines.  Collaterals and cruciates are stable.  Imaging: No results found. No images are attached to the encounter.  Labs: Lab Results  Component Value Date   HGBA1C 5.6 08/12/2017   HGBA1C 5.4 08/05/2016   HGBA1C 5.5 12/09/2015     Lab Results  Component Value Date   ALBUMIN 4.7 08/12/2017   ALBUMIN 4.7 08/05/2016   ALBUMIN 4.7 12/09/2015    Body mass index is 39.31 kg/m.  Orders:  Orders Placed This Encounter  Procedures  . Large Joint Inj   No orders of the defined types were placed in this encounter.    Procedures: Large Joint  Inj: bilateral knee on 11/03/2017 8:29 AM Indications: pain and diagnostic evaluation Details: 22 G 1.5 in needle, anteromedial approach  Arthrogram: No  Outcome: tolerated well, no immediate complications Procedure, treatment alternatives, risks and benefits explained, specific risks discussed. Consent was given by the patient. Immediately prior to procedure a time out was called to verify the correct patient, procedure, equipment, support staff and site/side marked as required. Patient was prepped and draped in the usual sterile fashion.      Clinical Data: No additional findings.  ROS:  All other systems negative, except as noted in the HPI. Review of Systems  Objective: Vital Signs: Ht 5\' 7"  (1.702 m)   Wt 251 lb (113.9 kg)   BMI 39.31 kg/m   Specialty Comments:  No specialty comments available.  PMFS History: Patient Active Problem List   Diagnosis Date Noted  . BPH (benign prostatic hyperplasia) 08/13/2017  . Effusion, right knee 12/13/2016  . Bilateral primary osteoarthritis of knee 08/24/2016  . Plantar fasciitis 03/16/2016  . Achilles tendinitis, right leg 03/16/2016  . Rash 01/15/2016  . Stasis dermatitis 01/15/2016  . Chronic venous insufficiency 12/30/2015  . Cellulitis 12/30/2015  . Itching 12/09/2015  . Dizziness 03/25/2015  . Skin lesion of face 03/25/2015  . Abnormal liver function 03/25/2015  .  Hypokalemia 03/25/2015  . Acute upper respiratory infection 01/14/2015  . Neuritis of right lower extremity 08/20/2014  . DVT (deep venous thrombosis) ([place]) 02/08/2014  . Right knee pain 12/22/2013  . Fatigue 11/02/2010  . Preventative health care 11/01/2010  . DEPRESSION 05/31/2008  . Diarrhea 05/31/2008  . SKIN LESION 12/08/2007  . Pre-diabetes 07/22/2007  . PEPTIC ULCER DISEASE 07/22/2007  . INSOMNIA-SLEEP DISORDER-UNSPEC 07/21/2007  . Hyperlipidemia 11/21/2006  . Anxiety state 11/21/2006  . OBESITY, MILD 11/17/2006  . Essential hypertension  11/17/2006   Past Medical History:  Diagnosis Date  . ANXIETY 11/21/2006  . Cancer ([place])    skin cancer left cheek  . DEPRESSION 05/31/2008  . DIABETES MELLITUS, TYPE II 07/22/2007   no meds  . Diarrhea 05/31/2008  . DVT (deep venous thrombosis) ([place]) 02/08/2014  . HYPERLIPIDEMIA 11/21/2006  . HYPERTENSION 11/17/2006  . INSOMNIA-SLEEP DISORDER-UNSPEC 07/21/2007  . OBESITY, MILD 11/17/2006  . PEPTIC ULCER DISEASE 07/22/2007  . PEPTIC ULCER DISEASE, HX OF 11/17/2006  . Post-operative nausea and vomiting    past 2 surgeries  . SINUSITIS- ACUTE-NOS 12/08/2007  . SKIN LESION 12/08/2007    Family History  Problem Relation Age of Onset  . Heart disease Mother   . Stroke Other   . Cancer Other        prostate  . Heart disease Father   . COPD Brother     Past Surgical History:  Procedure Laterality Date  . inguinal herniorrhapy     bilat  . s/p incarcerated recurrent ventral hernia    . s/p meckel's diverticulum     Social History   Occupational History  . Not on file  Tobacco Use  . Smoking status: Never Smoker  . Smokeless tobacco: Never Used  Substance and Sexual Activity  . Alcohol use: No  . Drug use: No  . Sexual activity: Not on file

## 2017-11-09 DIAGNOSIS — N401 Enlarged prostate with lower urinary tract symptoms: Secondary | ICD-10-CM | POA: Diagnosis not present

## 2017-11-09 DIAGNOSIS — R350 Frequency of micturition: Secondary | ICD-10-CM | POA: Diagnosis not present

## 2017-11-09 DIAGNOSIS — R972 Elevated prostate specific antigen [PSA]: Secondary | ICD-10-CM | POA: Diagnosis not present

## 2017-11-14 ENCOUNTER — Ambulatory Visit: Payer: Medicare PPO | Admitting: Orthotics

## 2017-11-14 DIAGNOSIS — M7661 Achilles tendinitis, right leg: Secondary | ICD-10-CM

## 2017-11-14 NOTE — Progress Notes (Signed)
Patietn came in for recasting due to lost f/o that was set for refurbishing.

## 2017-12-05 ENCOUNTER — Encounter: Payer: Medicare PPO | Admitting: Orthotics

## 2018-02-09 ENCOUNTER — Encounter (INDEPENDENT_AMBULATORY_CARE_PROVIDER_SITE_OTHER): Payer: Self-pay | Admitting: Orthopedic Surgery

## 2018-02-09 ENCOUNTER — Ambulatory Visit (INDEPENDENT_AMBULATORY_CARE_PROVIDER_SITE_OTHER): Payer: Medicare PPO | Admitting: Physician Assistant

## 2018-02-09 VITALS — Ht 67.0 in | Wt 251.0 lb

## 2018-02-09 DIAGNOSIS — M170 Bilateral primary osteoarthritis of knee: Secondary | ICD-10-CM

## 2018-02-09 NOTE — Progress Notes (Signed)
Office Visit Note   Patient: Joel King           Date of Birth: [birth year]           MRN: [id number] Visit Date: 02/09/2018              Requested by: Biagio Borg, MD [place], [place] [address] PCP: Biagio Borg, MD  Chief Complaint  Patient presents with  . Left Knee - Pain    Bilateral Knee Inj ; Hyaluronic Acid  . Right Knee - Pain      HPI: The patient is a 67 year old male who presents for follow-up of his bilateral knee osteoarthritis.  He had steroid injections of bilateral knees back in August of this year and reports that they helped significantly but he has noticed over the past several weeks that his pain is returning.  It is not as severe as it was when he was injected back in August but it is getting that way.  He notes pain going up and down stairs and when he has to stand for long periods.  Assessment & Plan: Visit Diagnoses:  1. Bilateral primary osteoarthritis of knee     Plan: After informed consent the bilateral knees were injected with a combination of lidocaine and Depo-Medrol under sterile techniques and the patient tolerated this well.  We did discuss possible hyaluronic acid injections but the patient reports that he feels he is getting good results from the cortisone would like to stick with this for now.  The patient will call back should he desire to have a hyaluronic acid injection to bilateral knees.  Otherwise he will follow-up here on an as-needed basis.  Follow-Up Instructions: Return if symptoms worsen or fail to improve.   Ortho Exam  Patient is alert, oriented, no adenopathy, well-dressed, normal affect, normal respiratory effort. Patient ambulates with an antalgic appearing gait.  He does have difficulty going from sitting to standing.  He has swelling about both knees with mild effusion left side worse than right but no signs of cellulitis or infection.  He has crepitus with range of motion and tenderness along  with gentle joint line both medial and laterally.  There is no knee instability.  Imaging: No results found. No images are attached to the encounter.  Labs: Lab Results  Component Value Date   HGBA1C 5.6 08/12/2017   HGBA1C 5.4 08/05/2016   HGBA1C 5.5 12/09/2015     Lab Results  Component Value Date   ALBUMIN 4.7 08/12/2017   ALBUMIN 4.7 08/05/2016   ALBUMIN 4.7 12/09/2015    Body mass index is 39.31 kg/m.  Orders:  Orders Placed This Encounter  Procedures  . Large Joint Inj: bilateral knee   No orders of the defined types were placed in this encounter.    Procedures: Large Joint Inj: bilateral knee on 02/09/2018 8:38 AM Indications: pain and diagnostic evaluation Details: 22 G 1.5 in needle, anteromedial approach  Arthrogram: No  Outcome: tolerated well, no immediate complications Procedure, treatment alternatives, risks and benefits explained, specific risks discussed. Consent was given by the patient. Immediately prior to procedure a time out was called to verify the correct patient, procedure, equipment, support staff and site/side marked as required. Patient was prepped and draped in the usual sterile fashion.      Clinical Data: No additional findings.  ROS:  All other systems negative, except as noted in the HPI. Review of Systems  Objective:  Vital Signs: Ht 5\' 7"  (1.702 m)   Wt 251 lb (113.9 kg)   BMI 39.31 kg/m   Specialty Comments:  No specialty comments available.  PMFS History: Patient Active Problem List   Diagnosis Date Noted  . BPH (benign prostatic hyperplasia) 08/13/2017  . Effusion, right knee 12/13/2016  . Bilateral primary osteoarthritis of knee 08/24/2016  . Plantar fasciitis 03/16/2016  . Achilles tendinitis, right leg 03/16/2016  . Rash 01/15/2016  . Stasis dermatitis 01/15/2016  . Chronic venous insufficiency 12/30/2015  . Cellulitis 12/30/2015  . Itching 12/09/2015  . Dizziness 03/25/2015  . Skin lesion of face  03/25/2015  . Abnormal liver function 03/25/2015  . Hypokalemia 03/25/2015  . Acute upper respiratory infection 01/14/2015  . Neuritis of right lower extremity 08/20/2014  . DVT (deep venous thrombosis) ([place]) 02/08/2014  . Right knee pain 12/22/2013  . Fatigue 11/02/2010  . Preventative health care 11/01/2010  . DEPRESSION 05/31/2008  . Diarrhea 05/31/2008  . SKIN LESION 12/08/2007  . Pre-diabetes 07/22/2007  . PEPTIC ULCER DISEASE 07/22/2007  . INSOMNIA-SLEEP DISORDER-UNSPEC 07/21/2007  . Hyperlipidemia 11/21/2006  . Anxiety state 11/21/2006  . OBESITY, MILD 11/17/2006  . Essential hypertension 11/17/2006   Past Medical History:  Diagnosis Date  . ANXIETY 11/21/2006  . Cancer ([place])    skin cancer left cheek  . DEPRESSION 05/31/2008  . DIABETES MELLITUS, TYPE II 07/22/2007   no meds  . Diarrhea 05/31/2008  . DVT (deep venous thrombosis) ([place]) 02/08/2014  . HYPERLIPIDEMIA 11/21/2006  . HYPERTENSION 11/17/2006  . INSOMNIA-SLEEP DISORDER-UNSPEC 07/21/2007  . OBESITY, MILD 11/17/2006  . PEPTIC ULCER DISEASE 07/22/2007  . PEPTIC ULCER DISEASE, HX OF 11/17/2006  . Post-operative nausea and vomiting    past 2 surgeries  . SINUSITIS- ACUTE-NOS 12/08/2007  . SKIN LESION 12/08/2007    Family History  Problem Relation Age of Onset  . Heart disease Mother   . Stroke Other   . Cancer Other        prostate  . Heart disease Father   . COPD Brother     Past Surgical History:  Procedure Laterality Date  . inguinal herniorrhapy     bilat  . s/p incarcerated recurrent ventral hernia    . s/p meckel's diverticulum     Social History   Occupational History  . Not on file  Tobacco Use  . Smoking status: Never Smoker  . Smokeless tobacco: Never Used  Substance and Sexual Activity  . Alcohol use: No  . Drug use: No  . Sexual activity: Not on file

## 2018-02-15 ENCOUNTER — Telehealth (INDEPENDENT_AMBULATORY_CARE_PROVIDER_SITE_OTHER): Payer: Self-pay

## 2018-02-15 NOTE — Telephone Encounter (Signed)
Submitted VOB for Monovisc, bilateral knee. 

## 2018-02-22 DIAGNOSIS — R351 Nocturia: Secondary | ICD-10-CM | POA: Diagnosis not present

## 2018-02-22 DIAGNOSIS — R350 Frequency of micturition: Secondary | ICD-10-CM | POA: Diagnosis not present

## 2018-03-09 ENCOUNTER — Telehealth (INDEPENDENT_AMBULATORY_CARE_PROVIDER_SITE_OTHER): Payer: Self-pay

## 2018-03-09 NOTE — Telephone Encounter (Signed)
Called and left a VM advising patient to CB to schedule an appointment for gel injection.  Approved for Monovisc, bilateral knee. [place] Patient will be responsible for 20% OOP. May have a Co-pay of $50.00? No PA required

## 2018-03-14 ENCOUNTER — Telehealth: Payer: Self-pay

## 2018-03-14 NOTE — Telephone Encounter (Signed)
Left message for patient to call back to schedule.  °

## 2018-03-14 NOTE — Telephone Encounter (Signed)
Please schedule pt an appt.    Copied from [place] [id number]. Topic: General - Other >> Mar 14, 2018  8:02 AM Keene Breath wrote: Reason for CRM: Patient called to request that the doctor call him in some medicine for a bad cough and wheezing.  Patient would prefer not to come in, but if it gets worse he would be willing to schedule an appointment.  Please advise and call patient back at [phone]

## 2018-03-15 ENCOUNTER — Ambulatory Visit: Payer: Medicare PPO | Admitting: Internal Medicine

## 2018-03-15 ENCOUNTER — Encounter: Payer: Self-pay | Admitting: Internal Medicine

## 2018-03-15 DIAGNOSIS — R059 Cough, unspecified: Secondary | ICD-10-CM

## 2018-03-15 DIAGNOSIS — I10 Essential (primary) hypertension: Secondary | ICD-10-CM | POA: Diagnosis not present

## 2018-03-15 DIAGNOSIS — R05 Cough: Secondary | ICD-10-CM | POA: Diagnosis not present

## 2018-03-15 DIAGNOSIS — R7303 Prediabetes: Secondary | ICD-10-CM

## 2018-03-15 MED ORDER — HYDROCODONE-HOMATROPINE 5-1.5 MG/5ML PO SYRP: 5.0000 mL | ORAL_SOLUTION | Freq: Four times a day (QID) | ORAL | 0 refills | Status: AC | PRN

## 2018-03-15 MED ORDER — AZITHROMYCIN 250 MG PO TABS: ORAL_TABLET | ORAL | 1 refills | Status: DC

## 2018-03-15 MED ORDER — GUAIFENESIN ER 600 MG PO TB12: 600.0000 mg | ORAL_TABLET | Freq: Two times a day (BID) | ORAL | 1 refills | Status: DC | PRN

## 2018-03-15 NOTE — Progress Notes (Signed)
Subjective:    Patient ID: Joel King, male    DOB: 07-20-1950, 67 y.o.   MRN: [id number]  HPI  Here with acute onset mild to mod 2-3 days ST, HA, general weakness and malaise, with prod cough greenish sputum, but Pt denies chest pain, increased sob or doe, wheezing, orthopnea, PND, increased LE swelling, palpitations, dizziness or syncope.  Pt denies new neurological symptoms such as new headache, or facial or extremity weakness or numbness   Pt denies polydipsia, polyuria.  Denies urinary symptoms such as dysuria, frequency, urgency, flank pain, hematuria or n/v, fever, chills, except for frequency c/w OAB, and recently given samples of myrbetrix to try.  No prostate biopsy needed after recent elevated PSA   Pt denies wt loss, night sweats, loss of appetite, or other constitutional symptoms.   Past Medical History:  Diagnosis Date  . ANXIETY 11/21/2006  . Cancer ([place])    skin cancer left cheek  . DEPRESSION 05/31/2008  . DIABETES MELLITUS, TYPE II 07/22/2007   no meds  . Diarrhea 05/31/2008  . DVT (deep venous thrombosis) ([place]) 02/08/2014  . HYPERLIPIDEMIA 11/21/2006  . HYPERTENSION 11/17/2006  . INSOMNIA-SLEEP DISORDER-UNSPEC 07/21/2007  . OBESITY, MILD 11/17/2006  . PEPTIC ULCER DISEASE 07/22/2007  . PEPTIC ULCER DISEASE, HX OF 11/17/2006  . Post-operative nausea and vomiting    past 2 surgeries  . SINUSITIS- ACUTE-NOS 12/08/2007  . SKIN LESION 12/08/2007   Past Surgical History:  Procedure Laterality Date  . inguinal herniorrhapy     bilat  . s/p incarcerated recurrent ventral hernia    . s/p meckel's diverticulum      reports that he has never smoked. He has never used smokeless tobacco. He reports that he does not drink alcohol or use drugs. family history includes COPD in his brother; Cancer in an other family member; Heart disease in his father and mother; Stroke in an other family member. Allergies  Allergen Reactions  . Atorvastatin     Unsure of results  . Pravastatin  Sodium     Unsure of reaction  . Rosuvastatin     Unsure of reaction   Current Outpatient Medications on File Prior to Visit  Medication Sig Dispense Refill  . amLODipine (NORVASC) 10 MG tablet Take 1 tablet (10 mg total) by mouth daily. 90 tablet 3  . aspirin 81 MG tablet Take 81 mg by mouth daily.      Marland Kitchen atenolol-chlorthalidone (TENORETIC) 100-25 MG tablet TAKE 1 TABLET EVERY DAY 90 tablet 3  . benazepril (LOTENSIN) 40 MG tablet Take 1 tablet (40 mg total) by mouth daily. 90 tablet 3  . clobetasol cream (TEMOVATE) 3.30 % Apply 1 application topically 2 (two) times daily. 30 g 1  . finasteride (PROSCAR) 5 MG tablet Take 1 tablet (5 mg total) by mouth daily. 90 tablet 3  . KRILL OIL PO Take 500 mg by mouth daily at 6 (six) AM.    . Multiple Vitamins-Minerals (CENTRUM SILVER PO) Take by mouth daily at 6 (six) AM.    . potassium chloride (K-DUR) 10 MEQ tablet 2 tabs by mouth daily 180 tablet 3  . ranitidine (ZANTAC) 150 MG tablet Take 150 mg by mouth as needed for heartburn.    . simvastatin (ZOCOR) 20 MG tablet TAKE 1 TABLET BY MOUTH  DAILY. 90 tablet 3   Current Facility-Administered Medications on File Prior to Visit  Medication Dose Route Frequency Provider Last Rate Last Dose  . 0.9 %  sodium chloride infusion  500 mL Intravenous Once Irene Shipper, MD       Review of Systems  Constitutional: Negative for other unusual diaphoresis or sweats HENT: Negative for ear discharge or swelling Eyes: Negative for other worsening visual disturbances Respiratory: Negative for stridor or other swelling  Gastrointestinal: Negative for worsening distension or other blood Genitourinary: Negative for retention or other urinary change Musculoskeletal: Negative for other MSK pain or swelling Skin: Negative for color change or other new lesions Neurological: Negative for worsening tremors and other numbness  Psychiatric/Behavioral: Negative for worsening agitation or other fatigue All other system  neg per pt    Objective:   Physical Exam BP 124/82   Pulse 61   Temp 98.5 F (36.9 C) (Oral)   Ht 5\' 7"  (1.702 m)   Wt 246 lb (111.6 kg)   SpO2 95%   BMI 38.53 kg/m  VS noted, mild ill Constitutional: Pt appears in NAD HENT: Head: NCAT.  Right Ear: External ear normal.  Left Ear: External ear normal.  Eyes: . Pupils are equal, round, and reactive to light. Conjunctivae and EOM are normal Bilat tm's with mild erythema.  Max sinus areas non tender.  Pharynx with mild erythema, no exudate  Nose: without d/c or deformity Neck: Neck supple. Gross normal ROM Cardiovascular: Normal rate and regular rhythm.   Pulmonary/Chest: Effort normal and breath sounds decreased without rales or wheezing.  Neurological: Pt is alert. At baseline orientation, motor grossly intact Skin: Skin is warm. No rashes, other new lesions, no LE edema Psychiatric: Pt behavior is normal without agitation  No other exam findings Lab Results  Component Value Date   WBC 11.2 (H) 08/12/2017   HGB 17.3 (H) 08/12/2017   HCT 49.2 08/12/2017   PLT 219.0 08/12/2017   GLUCOSE 124 (H) 08/12/2017   CHOL 159 08/12/2017   TRIG 172.0 (H) 08/12/2017   HDL 34.20 (L) 08/12/2017   LDLDIRECT 106.0 08/05/2016   LDLCALC 91 08/12/2017   ALT 20 08/12/2017   AST 18 08/12/2017   NA 140 08/12/2017   K 3.4 (L) 08/12/2017   CL 99 08/12/2017   CREATININE 0.86 08/12/2017   BUN 17 08/12/2017   CO2 32 08/12/2017   TSH 1.57 08/12/2017   PSA 5.94 (H) 08/12/2017   HGBA1C 5.6 08/12/2017   MICROALBUR 4.6 (H) 12/09/2015          Assessment & Plan:

## 2018-03-15 NOTE — Assessment & Plan Note (Signed)
stable overall by history and exam, recent data reviewed with pt, and pt to continue medical treatment as before,  to f/u any worsening symptoms or concerns, watch closely on the myrbetrix

## 2018-03-15 NOTE — Assessment & Plan Note (Signed)
stable overall by history and exam, recent data reviewed with pt, and pt to continue medical treatment as before,  to f/u any worsening symptoms or concerns  

## 2018-03-15 NOTE — Assessment & Plan Note (Signed)
Mild to mod, c/w bronchitis vs pna, declines cxr, for antibx course, cough med prn,  to f/u any worsening symptoms or concerns 

## 2018-03-15 NOTE — Patient Instructions (Signed)
Please take all new medication as prescribed - the antibiotic, cough medicine, and mucinex  Please continue all other medications as before, and remember to watch for high blood pressure on the Myrbetrix  Please have the pharmacy call with any other refills you may need.  Please continue your efforts at being more active, low cholesterol diet, and weight control.  Please keep your appointments with your specialists as you may have planned

## 2018-03-24 DIAGNOSIS — H5213 Myopia, bilateral: Secondary | ICD-10-CM | POA: Diagnosis not present

## 2018-03-24 DIAGNOSIS — H524 Presbyopia: Secondary | ICD-10-CM | POA: Diagnosis not present

## 2018-03-24 DIAGNOSIS — H25093 Other age-related incipient cataract, bilateral: Secondary | ICD-10-CM | POA: Diagnosis not present

## 2018-03-24 DIAGNOSIS — H52223 Regular astigmatism, bilateral: Secondary | ICD-10-CM | POA: Diagnosis not present

## 2018-04-05 DIAGNOSIS — R351 Nocturia: Secondary | ICD-10-CM | POA: Diagnosis not present

## 2018-04-19 ENCOUNTER — Ambulatory Visit: Payer: Medicare PPO | Admitting: Internal Medicine

## 2018-05-08 DIAGNOSIS — R972 Elevated prostate specific antigen [PSA]: Secondary | ICD-10-CM | POA: Diagnosis not present

## 2018-05-15 ENCOUNTER — Ambulatory Visit (INDEPENDENT_AMBULATORY_CARE_PROVIDER_SITE_OTHER): Payer: Medicare PPO | Admitting: Orthopedic Surgery

## 2018-05-15 ENCOUNTER — Encounter (INDEPENDENT_AMBULATORY_CARE_PROVIDER_SITE_OTHER): Payer: Self-pay | Admitting: Orthopedic Surgery

## 2018-05-15 VITALS — Ht 67.0 in | Wt 246.0 lb

## 2018-05-15 DIAGNOSIS — M170 Bilateral primary osteoarthritis of knee: Secondary | ICD-10-CM | POA: Diagnosis not present

## 2018-05-15 DIAGNOSIS — M1712 Unilateral primary osteoarthritis, left knee: Secondary | ICD-10-CM | POA: Diagnosis not present

## 2018-05-15 DIAGNOSIS — M1711 Unilateral primary osteoarthritis, right knee: Secondary | ICD-10-CM

## 2018-05-23 ENCOUNTER — Encounter (INDEPENDENT_AMBULATORY_CARE_PROVIDER_SITE_OTHER): Payer: Self-pay | Admitting: Orthopedic Surgery

## 2018-05-23 DIAGNOSIS — R350 Frequency of micturition: Secondary | ICD-10-CM | POA: Diagnosis not present

## 2018-05-23 DIAGNOSIS — M170 Bilateral primary osteoarthritis of knee: Secondary | ICD-10-CM | POA: Diagnosis not present

## 2018-05-23 DIAGNOSIS — R972 Elevated prostate specific antigen [PSA]: Secondary | ICD-10-CM | POA: Diagnosis not present

## 2018-05-23 DIAGNOSIS — N401 Enlarged prostate with lower urinary tract symptoms: Secondary | ICD-10-CM | POA: Diagnosis not present

## 2018-05-23 NOTE — Progress Notes (Signed)
Office Visit Note   Patient: Joel King           Date of Birth: [birth year]           MRN: [id number] Visit Date: 05/15/2018              Requested by: Biagio Borg, MD [place], [place] [address] PCP: Biagio Borg, MD  Chief Complaint  Patient presents with  . Left Knee - Follow-up  . Right Knee - Follow-up      HPI: Patient is a 68 year old gentleman who presents in follow-up for osteoarthritis bilateral knees.  Patient states that the cortisone injections last in about 3 to 4 months.  Assessment & Plan: Visit Diagnoses:  1. Bilateral primary osteoarthritis of knee     Plan: Both knees were injected without complications follow-up as needed  Follow-Up Instructions: Return if symptoms worsen or fail to improve.   Ortho Exam  Patient is alert, oriented, no adenopathy, well-dressed, normal affect, normal respiratory effort. Examination patient has an antalgic gait there is crepitation with range of motion of both knees collaterals and cruciates are stable there is no effusion no redness no cellulitis.  Imaging: No results found. No images are attached to the encounter.  Labs: Lab Results  Component Value Date   HGBA1C 5.6 08/12/2017   HGBA1C 5.4 08/05/2016   HGBA1C 5.5 12/09/2015     Lab Results  Component Value Date   ALBUMIN 4.7 08/12/2017   ALBUMIN 4.7 08/05/2016   ALBUMIN 4.7 12/09/2015    Body mass index is 38.53 kg/m.  Orders:  No orders of the defined types were placed in this encounter.  No orders of the defined types were placed in this encounter.    Procedures: Large Joint Inj: bilateral knee on 05/23/2018 5:28 PM Indications: pain and diagnostic evaluation Details: 22 G 1.5 in needle, anteromedial approach  Arthrogram: No  Outcome: tolerated well, no immediate complications Procedure, treatment alternatives, risks and benefits explained, specific risks discussed. Consent was given by the patient. Immediately  prior to procedure a time out was called to verify the correct patient, procedure, equipment, support staff and site/side marked as required. Patient was prepped and draped in the usual sterile fashion.      Clinical Data: No additional findings.  ROS:  All other systems negative, except as noted in the HPI. Review of Systems  Objective: Vital Signs: Ht 5\' 7"  (1.702 m)   Wt 246 lb (111.6 kg)   BMI 38.53 kg/m   Specialty Comments:  No specialty comments available.  PMFS History: Patient Active Problem List   Diagnosis Date Noted  . Cough 03/15/2018  . BPH (benign prostatic hyperplasia) 08/13/2017  . Effusion, right knee 12/13/2016  . Bilateral primary osteoarthritis of knee 08/24/2016  . Plantar fasciitis 03/16/2016  . Achilles tendinitis, right leg 03/16/2016  . Rash 01/15/2016  . Stasis dermatitis 01/15/2016  . Chronic venous insufficiency 12/30/2015  . Cellulitis 12/30/2015  . Itching 12/09/2015  . Dizziness 03/25/2015  . Skin lesion of face 03/25/2015  . Abnormal liver function 03/25/2015  . Hypokalemia 03/25/2015  . Acute upper respiratory infection 01/14/2015  . Neuritis of right lower extremity 08/20/2014  . DVT (deep venous thrombosis) ([place]) 02/08/2014  . Right knee pain 12/22/2013  . Fatigue 11/02/2010  . Preventative health care 11/01/2010  . DEPRESSION 05/31/2008  . Diarrhea 05/31/2008  . SKIN LESION 12/08/2007  . Pre-diabetes 07/22/2007  . PEPTIC ULCER DISEASE 07/22/2007  .  INSOMNIA-SLEEP DISORDER-UNSPEC 07/21/2007  . Hyperlipidemia 11/21/2006  . Anxiety state 11/21/2006  . OBESITY, MILD 11/17/2006  . Essential hypertension 11/17/2006   Past Medical History:  Diagnosis Date  . ANXIETY 11/21/2006  . Cancer ([place])    skin cancer left cheek  . DEPRESSION 05/31/2008  . DIABETES MELLITUS, TYPE II 07/22/2007   no meds  . Diarrhea 05/31/2008  . DVT (deep venous thrombosis) ([place]) 02/08/2014  . HYPERLIPIDEMIA 11/21/2006  . HYPERTENSION 11/17/2006  .  INSOMNIA-SLEEP DISORDER-UNSPEC 07/21/2007  . OBESITY, MILD 11/17/2006  . PEPTIC ULCER DISEASE 07/22/2007  . PEPTIC ULCER DISEASE, HX OF 11/17/2006  . Post-operative nausea and vomiting    past 2 surgeries  . SINUSITIS- ACUTE-NOS 12/08/2007  . SKIN LESION 12/08/2007    Family History  Problem Relation Age of Onset  . Heart disease Mother   . Stroke Other   . Cancer Other        prostate  . Heart disease Father   . COPD Brother     Past Surgical History:  Procedure Laterality Date  . inguinal herniorrhapy     bilat  . s/p incarcerated recurrent ventral hernia    . s/p meckel's diverticulum     Social History   Occupational History  . Not on file  Tobacco Use  . Smoking status: Never Smoker  . Smokeless tobacco: Never Used  Substance and Sexual Activity  . Alcohol use: No  . Drug use: No  . Sexual activity: Not on file

## 2018-06-17 ENCOUNTER — Other Ambulatory Visit: Payer: Self-pay | Admitting: Internal Medicine

## 2018-08-07 ENCOUNTER — Telehealth: Payer: Self-pay | Admitting: *Deleted

## 2018-08-07 MED ORDER — TRAZODONE HCL 50 MG PO TABS: 25.0000 mg | ORAL_TABLET | Freq: Every evening | ORAL | 1 refills | Status: DC | PRN

## 2018-08-07 NOTE — Telephone Encounter (Signed)
Done erx 

## 2018-08-07 NOTE — Telephone Encounter (Signed)
Received refill request for Trazodone 50 mg. Medication was d/c'd. Ok to Rf? Patient is scheduled 08/18/18. Please advise.

## 2018-08-08 ENCOUNTER — Telehealth: Payer: Self-pay

## 2018-08-08 NOTE — Telephone Encounter (Signed)
Left patient vm to call back to move CPE out due to [place]

## 2018-08-08 NOTE — Telephone Encounter (Signed)
Copied from [place] [id number]. Topic: General - Inquiry >> Aug 07, 2018 12:04 PM Joel King, [place] wrote: Reason for CRM: Patient is calling in requesting a call back to [phone] in regards to his appointment this upcoming 5/22. Patient is just wondering if he is still coming into the office. States someone told him not to follow the automated system so he is confused if he has is appointment still. Please advise

## 2018-08-14 ENCOUNTER — Ambulatory Visit: Payer: Medicare PPO | Admitting: Orthopedic Surgery

## 2018-08-14 ENCOUNTER — Encounter: Payer: Self-pay | Admitting: Orthopedic Surgery

## 2018-08-14 ENCOUNTER — Other Ambulatory Visit: Payer: Self-pay

## 2018-08-14 VITALS — Ht 67.0 in | Wt 246.0 lb

## 2018-08-14 DIAGNOSIS — M170 Bilateral primary osteoarthritis of knee: Secondary | ICD-10-CM

## 2018-08-14 NOTE — Progress Notes (Signed)
Office Visit Note   Patient: Joel King           Date of Birth: 05-30-1950           MRN: [id number] Visit Date: 08/14/2018              Requested by: Biagio Borg, MD [place], [place] [address] PCP: Biagio Borg, MD  Chief Complaint  Patient presents with  . Right Knee - Pain, Follow-up  . Left Knee - Pain, Follow-up      HPI: Patient is a 68 year old gentleman who presents with osteoarthritis both knees.  Patient states he has had good relief with previous injection about 3 months ago.  He states he cannot perform his activities of daily living due to pain and wants to consider further treatment.  Assessment & Plan: Visit Diagnoses:  1. Bilateral primary osteoarthritis of knee     Plan: Both knees were injected he tolerated this well reevaluate in 3 months.  Follow-Up Instructions: Return in about 3 months (around 11/14/2018).   Ortho Exam  Patient is alert, oriented, no adenopathy, well-dressed, normal affect, normal respiratory effort. Examination patient has difficulty getting from a sitting to a standing position.  He does have an antalgic gait.  Both knees are tender to palpation of the medial and lateral joint lines.  Collaterals and cruciates are stable.  There is no effusion.  There is no redness no cellulitis.  Imaging: No results found. No images are attached to the encounter.  Labs: Lab Results  Component Value Date   HGBA1C 5.6 08/12/2017   HGBA1C 5.4 08/05/2016   HGBA1C 5.5 12/09/2015     Lab Results  Component Value Date   ALBUMIN 4.7 08/12/2017   ALBUMIN 4.7 08/05/2016   ALBUMIN 4.7 12/09/2015    Body mass index is 38.53 kg/m.  Orders:  No orders of the defined types were placed in this encounter.  No orders of the defined types were placed in this encounter.    Procedures: Large Joint Inj: bilateral knee on 08/14/2018 12:55 PM Indications: pain and diagnostic evaluation Details: 22 G 1.5 in needle,  anteromedial approach  Arthrogram: No  Outcome: tolerated well, no immediate complications Procedure, treatment alternatives, risks and benefits explained, specific risks discussed. Consent was given by the patient. Immediately prior to procedure a time out was called to verify the correct patient, procedure, equipment, support staff and site/side marked as required. Patient was prepped and draped in the usual sterile fashion.      Clinical Data: No additional findings.  ROS:  All other systems negative, except as noted in the HPI. Review of Systems  Objective: Vital Signs: Ht 5\' 7"  (1.702 m)   Wt 246 lb (111.6 kg)   BMI 38.53 kg/m   Specialty Comments:  No specialty comments available.  PMFS History: Patient Active Problem List   Diagnosis Date Noted  . Cough 03/15/2018  . BPH (benign prostatic hyperplasia) 08/13/2017  . Effusion, right knee 12/13/2016  . Bilateral primary osteoarthritis of knee 08/24/2016  . Plantar fasciitis 03/16/2016  . Achilles tendinitis, right leg 03/16/2016  . Rash 01/15/2016  . Stasis dermatitis 01/15/2016  . Chronic venous insufficiency 12/30/2015  . Cellulitis 12/30/2015  . Itching 12/09/2015  . Dizziness 03/25/2015  . Skin lesion of face 03/25/2015  . Abnormal liver function 03/25/2015  . Hypokalemia 03/25/2015  . Acute upper respiratory infection 01/14/2015  . Neuritis of right lower extremity 08/20/2014  .  DVT (deep venous thrombosis) ([place]) 02/08/2014  . Right knee pain 12/22/2013  . Fatigue 11/02/2010  . Preventative health care 11/01/2010  . DEPRESSION 05/31/2008  . Diarrhea 05/31/2008  . SKIN LESION 12/08/2007  . Pre-diabetes 07/22/2007  . PEPTIC ULCER DISEASE 07/22/2007  . INSOMNIA-SLEEP DISORDER-UNSPEC 07/21/2007  . Hyperlipidemia 11/21/2006  . Anxiety state 11/21/2006  . OBESITY, MILD 11/17/2006  . Essential hypertension 11/17/2006   Past Medical History:  Diagnosis Date  . ANXIETY 11/21/2006  . Cancer ([place])     skin cancer left cheek  . DEPRESSION 05/31/2008  . DIABETES MELLITUS, TYPE II 07/22/2007   no meds  . Diarrhea 05/31/2008  . DVT (deep venous thrombosis) ([place]) 02/08/2014  . HYPERLIPIDEMIA 11/21/2006  . HYPERTENSION 11/17/2006  . INSOMNIA-SLEEP DISORDER-UNSPEC 07/21/2007  . OBESITY, MILD 11/17/2006  . PEPTIC ULCER DISEASE 07/22/2007  . PEPTIC ULCER DISEASE, HX OF 11/17/2006  . Post-operative nausea and vomiting    past 2 surgeries  . SINUSITIS- ACUTE-NOS 12/08/2007  . SKIN LESION 12/08/2007    Family History  Problem Relation Age of Onset  . Heart disease Mother   . Stroke Other   . Cancer Other        prostate  . Heart disease Father   . COPD Brother     Past Surgical History:  Procedure Laterality Date  . inguinal herniorrhapy     bilat  . s/p incarcerated recurrent ventral hernia    . s/p meckel's diverticulum     Social History   Occupational History  . Not on file  Tobacco Use  . Smoking status: Never Smoker  . Smokeless tobacco: Never Used  Substance and Sexual Activity  . Alcohol use: No  . Drug use: No  . Sexual activity: Not on file

## 2018-08-18 ENCOUNTER — Encounter: Payer: Medicare PPO | Admitting: Internal Medicine

## 2018-08-26 ENCOUNTER — Other Ambulatory Visit: Payer: Self-pay | Admitting: Internal Medicine

## 2018-10-06 ENCOUNTER — Telehealth: Payer: Self-pay

## 2018-10-06 DIAGNOSIS — R7303 Prediabetes: Secondary | ICD-10-CM

## 2018-10-06 DIAGNOSIS — Z0000 Encounter for general adult medical examination without abnormal findings: Secondary | ICD-10-CM

## 2018-10-06 NOTE — Telephone Encounter (Signed)
Copied from [place] [id number]. Topic: Appointment Scheduling - Scheduling Inquiry for Clinic >> Oct 06, 2018  9:17 AM Lennox Solders wrote: Reason for CRM: pt would like cpe labs work on Monday 10-09-2018. Pt is schedule for physical on 10/17/2018 >> Oct 06, 2018 10:20 AM [place], Isidoro Donning wrote: Can labs be entered for this patient? Please advise.

## 2018-10-09 ENCOUNTER — Other Ambulatory Visit (INDEPENDENT_AMBULATORY_CARE_PROVIDER_SITE_OTHER): Payer: Medicare PPO

## 2018-10-09 DIAGNOSIS — R7303 Prediabetes: Secondary | ICD-10-CM

## 2018-10-09 DIAGNOSIS — Z0000 Encounter for general adult medical examination without abnormal findings: Secondary | ICD-10-CM

## 2018-10-09 LAB — BASIC METABOLIC PANEL
BUN: 20 mg/dL (ref 6–23)
CO2: 28 mEq/L (ref 19–32)
Calcium: 9.4 mg/dL (ref 8.4–10.5)
Chloride: 101 mEq/L (ref 96–112)
Creatinine, Ser: 0.87 mg/dL (ref 0.40–1.50)
GFR: 87.18 mL/min (ref >60.00)
Glucose, Bld: 140 mg/dL — ABNORMAL HIGH (ref 70–99)
Potassium: 3.3 mEq/L — ABNORMAL LOW (ref 3.5–5.1)
Sodium: 140 mEq/L (ref 135–145)

## 2018-10-09 LAB — HEPATIC FUNCTION PANEL
ALT: 18 U/L (ref 0–53)
AST: 15 U/L (ref 0–37)
Albumin: 4.6 g/dL (ref 3.5–5.2)
Alkaline Phosphatase: 57 U/L (ref 39–117)
Bilirubin, Direct: 0.1 mg/dL (ref 0.0–0.3)
Total Bilirubin: 0.9 mg/dL (ref 0.2–1.2)
Total Protein: 6.9 g/dL (ref 6.0–8.3)

## 2018-10-09 LAB — CBC WITH DIFFERENTIAL/PLATELET
Basophils Absolute: 0 10*3/uL (ref 0.0–0.1)
Basophils Relative: 0.3 % (ref 0.0–3.0)
Eosinophils Absolute: 0.4 10*3/uL (ref 0.0–0.7)
Eosinophils Relative: 3.9 % (ref 0.0–5.0)
HCT: 47.4 % (ref 39.0–52.0)
Hemoglobin: 16.4 g/dL (ref 13.0–17.0)
Lymphocytes Relative: 13 % (ref 12.0–46.0)
Lymphs Abs: 1.3 10*3/uL (ref 0.7–4.0)
MCHC: 34.6 g/dL (ref 30.0–36.0)
MCV: 92.9 fl (ref 78.0–100.0)
Monocytes Absolute: 0.9 10*3/uL (ref 0.1–1.0)
Monocytes Relative: 8.9 % (ref 3.0–12.0)
Neutro Abs: 7.2 10*3/uL (ref 1.4–7.7)
Neutrophils Relative %: 73.9 % (ref 43.0–77.0)
Platelets: 208 10*3/uL (ref 150.0–400.0)
RBC: 5.1 Mil/uL (ref 4.22–5.81)
RDW: 13.2 % (ref 11.5–15.5)
WBC: 9.7 10*3/uL (ref 4.0–10.5)

## 2018-10-09 LAB — URINALYSIS, ROUTINE W REFLEX MICROSCOPIC
Bilirubin Urine: NEGATIVE
Hgb urine dipstick: NEGATIVE
Ketones, ur: NEGATIVE
Leukocytes,Ua: NEGATIVE
Nitrite: NEGATIVE
RBC / HPF: NONE SEEN (ref 0–?)
Specific Gravity, Urine: 1.015 (ref 1.000–1.030)
Total Protein, Urine: NEGATIVE
Urine Glucose: NEGATIVE
Urobilinogen, UA: 0.2 (ref 0.0–1.0)
pH: 7 (ref 5.0–8.0)

## 2018-10-09 LAB — LIPID PANEL
Cholesterol: 147 mg/dL (ref 0–200)
HDL: 33.6 mg/dL — ABNORMAL LOW (ref >39.00)
LDL Cholesterol: 84 mg/dL (ref 0–99)
NonHDL: 112.92
Total CHOL/HDL Ratio: 4
Triglycerides: 146 mg/dL (ref 0.0–149.0)
VLDL: 29.2 mg/dL (ref 0.0–40.0)

## 2018-10-09 LAB — PSA: PSA: 1.12 ng/mL (ref 0.10–4.00)

## 2018-10-09 LAB — TSH: TSH: 1.49 u[IU]/mL (ref 0.35–4.50)

## 2018-10-09 LAB — HEMOGLOBIN A1C: Hgb A1c MFr Bld: 5.7 % (ref 4.6–6.5)

## 2018-10-17 ENCOUNTER — Other Ambulatory Visit: Payer: Self-pay

## 2018-10-17 ENCOUNTER — Telehealth: Payer: Self-pay | Admitting: Internal Medicine

## 2018-10-17 ENCOUNTER — Encounter: Payer: Self-pay | Admitting: Internal Medicine

## 2018-10-17 ENCOUNTER — Ambulatory Visit (INDEPENDENT_AMBULATORY_CARE_PROVIDER_SITE_OTHER): Payer: Medicare PPO | Admitting: Internal Medicine

## 2018-10-17 VITALS — BP 124/82 | HR 60 | Temp 97.8°F | Ht 67.0 in | Wt 241.0 lb

## 2018-10-17 DIAGNOSIS — E611 Iron deficiency: Secondary | ICD-10-CM

## 2018-10-17 DIAGNOSIS — R7303 Prediabetes: Secondary | ICD-10-CM

## 2018-10-17 DIAGNOSIS — E559 Vitamin D deficiency, unspecified: Secondary | ICD-10-CM | POA: Diagnosis not present

## 2018-10-17 DIAGNOSIS — Z0000 Encounter for general adult medical examination without abnormal findings: Secondary | ICD-10-CM

## 2018-10-17 DIAGNOSIS — E538 Deficiency of other specified B group vitamins: Secondary | ICD-10-CM

## 2018-10-17 MED ORDER — FINASTERIDE 5 MG PO TABS: 5.0000 mg | ORAL_TABLET | Freq: Every day | ORAL | 3 refills | Status: DC

## 2018-10-17 MED ORDER — BENAZEPRIL HCL 40 MG PO TABS: 40.0000 mg | ORAL_TABLET | Freq: Every day | ORAL | 3 refills | Status: DC

## 2018-10-17 MED ORDER — AMLODIPINE BESYLATE 10 MG PO TABS: 10.0000 mg | ORAL_TABLET | Freq: Every day | ORAL | 3 refills | Status: DC

## 2018-10-17 MED ORDER — POTASSIUM CHLORIDE ER 10 MEQ PO TBCR: EXTENDED_RELEASE_TABLET | ORAL | 3 refills | Status: DC

## 2018-10-17 MED ORDER — SIMVASTATIN 20 MG PO TABS: ORAL_TABLET | ORAL | 3 refills | Status: DC

## 2018-10-17 MED ORDER — FAMOTIDINE 20 MG PO TABS: 20.0000 mg | ORAL_TABLET | Freq: Two times a day (BID) | ORAL | 3 refills | Status: DC

## 2018-10-17 MED ORDER — ATENOLOL-CHLORTHALIDONE 100-25 MG PO TABS: 1.0000 | ORAL_TABLET | Freq: Every day | ORAL | 3 refills | Status: DC

## 2018-10-17 NOTE — Telephone Encounter (Signed)
Copied from [place] [id number]. Topic: General - Other >> Oct 17, 2018  9:23 AM Keene Breath wrote: Reason for CRM: Patient called to inform the doctor that his medication was sent to the wrong pharmacy.  His medications should go to [facility] because he does not have to pay.  It was for 7 prescriptions.  Please send to [facility] as soon as possible.

## 2018-10-17 NOTE — Progress Notes (Signed)
Subjective:    Patient ID: Joel King, male    DOB: May 18, 1950, 68 y.o.   MRN: [id number]  HPI  Here for wellness and f/u;  Overall doing ok;  Pt denies Chest pain, worsening SOB, DOE, wheezing, orthopnea, PND, worsening LE edema, palpitations, dizziness or syncope.  Pt denies neurological change such as new headache, facial or extremity weakness.  Pt denies polydipsia, polyuria, or low sugar symptoms. Pt states overall good compliance with treatment and medications, good tolerability, and has been trying to follow appropriate diet.  Pt denies worsening depressive symptoms, suicidal ideation or panic. No fever, night sweats, wt loss, loss of appetite, or other constitutional symptoms.  Pt states good ability with ADL's, has low fall risk, home safety reviewed and adequate, no other significant changes in hearing or vision, and only occasionally active with exercise.  Hard to lose wt but at least overall stable with his consistent lifestyle Past Medical History:  Diagnosis Date  . ANXIETY 11/21/2006  . Cancer ([place])    skin cancer left cheek  . DEPRESSION 05/31/2008  . DIABETES MELLITUS, TYPE II 07/22/2007   no meds  . Diarrhea 05/31/2008  . DVT (deep venous thrombosis) ([place]) 02/08/2014  . HYPERLIPIDEMIA 11/21/2006  . HYPERTENSION 11/17/2006  . INSOMNIA-SLEEP DISORDER-UNSPEC 07/21/2007  . OBESITY, MILD 11/17/2006  . PEPTIC ULCER DISEASE 07/22/2007  . PEPTIC ULCER DISEASE, HX OF 11/17/2006  . Post-operative nausea and vomiting    past 2 surgeries  . SINUSITIS- ACUTE-NOS 12/08/2007  . SKIN LESION 12/08/2007   Past Surgical History:  Procedure Laterality Date  . inguinal herniorrhapy     bilat  . s/p incarcerated recurrent ventral hernia    . s/p meckel's diverticulum      reports that he has never smoked. He has never used smokeless tobacco. He reports that he does not drink alcohol or use drugs. family history includes COPD in his brother; Cancer in an other family member; Heart disease  in his father and mother; Stroke in an other family member. Allergies  Allergen Reactions  . Atorvastatin     Unsure of results  . Pravastatin Sodium     Unsure of reaction  . Rosuvastatin     Unsure of reaction   Current Outpatient Medications on File Prior to Visit  Medication Sig Dispense Refill  . aspirin 81 MG tablet Take 81 mg by mouth daily.      . clobetasol cream (TEMOVATE) 7.49 % Apply 1 application topically 2 (two) times daily. 30 g 1  . guaiFENesin (MUCINEX) 600 MG 12 hr tablet Take 1 tablet (600 mg total) by mouth 2 (two) times daily as needed. 60 tablet 1  . KRILL OIL PO Take 500 mg by mouth daily at 6 (six) AM.    . Mirabegron (MYRBETRIQ PO) Take by mouth.    . Multiple Vitamins-Minerals (CENTRUM SILVER PO) Take by mouth daily at 6 (six) AM.    . traZODone (DESYREL) 50 MG tablet Take 0.5-1 tablets (25-50 mg total) by mouth at bedtime as needed for sleep. 90 tablet 1   No current facility-administered medications on file prior to visit.    Review of Systems Constitutional: Negative for other unusual diaphoresis, sweats, appetite or weight changes HENT: Negative for other worsening hearing loss, ear pain, facial swelling, mouth sores or neck stiffness.   Eyes: Negative for other worsening pain, redness or other visual disturbance.  Respiratory: Negative for other stridor or swelling Cardiovascular: Negative for other palpitations or other chest  pain  Gastrointestinal: Negative for worsening diarrhea or loose stools, blood in stool, distention or other pain Genitourinary: Negative for hematuria, flank pain or other change in urine volume.  Musculoskeletal: Negative for myalgias or other joint swelling.  Skin: Negative for other color change, or other wound or worsening drainage.  Neurological: Negative for other syncope or numbness. Hematological: Negative for other adenopathy or swelling Psychiatric/Behavioral: Negative for hallucinations, other worsening agitation,  SI, self-injury, or new decreased concentration All other system neg per pt    Objective:   Physical Exam BP 124/82   Pulse 60   Temp 97.8 F (36.6 C) (Oral)   Ht 5\' 7"  (1.702 m)   Wt 241 lb (109.3 kg)   SpO2 96%   BMI 37.75 kg/m  VS noted,  Constitutional: Pt is oriented to person, place, and time. Appears well-developed and well-nourished, in no significant distress and comfortable Head: Normocephalic and atraumatic  Eyes: Conjunctivae and EOM are normal. Pupils are equal, round, and reactive to light Right Ear: External ear normal without discharge Left Ear: External ear normal without discharge Nose: Nose without discharge or deformity Mouth/Throat: Oropharynx is without other ulcerations and moist  Neck: Normal range of motion. Neck supple. No JVD present. No tracheal deviation present or significant neck LA or mass Cardiovascular: Normal rate, regular rhythm, normal heart sounds and intact distal pulses.   Pulmonary/Chest: WOB normal and breath sounds without rales or wheezing  Abdominal: Soft. Bowel sounds are normal. NT. No HSM  Musculoskeletal: Normal range of motion. Exhibits no edema Lymphadenopathy: Has no other cervical adenopathy.  Neurological: Pt is alert and oriented to person, place, and time. Pt has normal reflexes. No cranial nerve deficit. Motor grossly intact, Gait intact Skin: Skin is warm and dry. No rash noted or new ulcerations Psychiatric:  Has normal mood and affect. Behavior is normal without agitation No other exam findings Lab Results  Component Value Date   WBC 9.7 10/09/2018   HGB 16.4 10/09/2018   HCT 47.4 10/09/2018   PLT 208.0 10/09/2018   GLUCOSE 140 (H) 10/09/2018   CHOL 147 10/09/2018   TRIG 146.0 10/09/2018   HDL 33.60 (L) 10/09/2018   LDLDIRECT 106.0 08/05/2016   LDLCALC 84 10/09/2018   ALT 18 10/09/2018   AST 15 10/09/2018   NA 140 10/09/2018   K 3.3 (L) 10/09/2018   CL 101 10/09/2018   CREATININE 0.87 10/09/2018   BUN 20  10/09/2018   CO2 28 10/09/2018   TSH 1.49 10/09/2018   PSA 1.12 10/09/2018   HGBA1C 5.7 10/09/2018   MICROALBUR 4.6 (H) 12/09/2015       Assessment & Plan:

## 2018-10-17 NOTE — Assessment & Plan Note (Signed)

## 2018-10-17 NOTE — Assessment & Plan Note (Signed)
stable overall by history and exam, recent data reviewed with pt, and pt to continue medical treatment as before,  to f/u any worsening symptoms or concerns  

## 2018-10-17 NOTE — Patient Instructions (Signed)
Please continue all other medications as before, and refills have been done if requested.  Please have the pharmacy call with any other refills you may need.  Please continue your efforts at being more active, low cholesterol diet, and weight control.  You are otherwise up to date with prevention measures today.  Please keep your appointments with your specialists as you may have planned  Please return in 1 year for your yearly visit, or sooner if needed, with Lab testing done 3-5 days before  

## 2018-10-19 MED ORDER — FINASTERIDE 5 MG PO TABS: 5.0000 mg | ORAL_TABLET | Freq: Every day | ORAL | 3 refills | Status: DC

## 2018-10-19 MED ORDER — FAMOTIDINE 20 MG PO TABS: 20.0000 mg | ORAL_TABLET | Freq: Two times a day (BID) | ORAL | 3 refills | Status: DC

## 2018-10-19 MED ORDER — ATENOLOL-CHLORTHALIDONE 100-25 MG PO TABS: 1.0000 | ORAL_TABLET | Freq: Every day | ORAL | 3 refills | Status: DC

## 2018-10-19 MED ORDER — SIMVASTATIN 20 MG PO TABS: ORAL_TABLET | ORAL | 3 refills | Status: DC

## 2018-10-19 MED ORDER — BENAZEPRIL HCL 40 MG PO TABS: 40.0000 mg | ORAL_TABLET | Freq: Every day | ORAL | 3 refills | Status: DC

## 2018-10-19 MED ORDER — AMLODIPINE BESYLATE 10 MG PO TABS: 10.0000 mg | ORAL_TABLET | Freq: Every day | ORAL | 3 refills | Status: DC

## 2018-10-19 NOTE — Telephone Encounter (Signed)
Pt following up on request to have his meds sent to the correct pharmacy    amLODipine (NORVASC) 10 MG tablet atenolol-chlorthalidone (TENORETIC) 100-25 MG tablet benazepril (LOTENSIN) 40 MG tablet famotidine (PEPCID) 20 MG tablet finasteride (PROSCAR) 5 MG tablet potassium chloride (K-DUR) 10 MEQ table simvastatin (ZOCOR) 20 MG tablet  these are the 7 scripts he needs sent to   [place], [place] [phone] (Phone) [phone] (Fax)   Pt called Tuesday. This was never routed

## 2018-10-25 ENCOUNTER — Other Ambulatory Visit: Payer: Self-pay | Admitting: Internal Medicine

## 2018-11-13 ENCOUNTER — Ambulatory Visit (INDEPENDENT_AMBULATORY_CARE_PROVIDER_SITE_OTHER): Payer: Medicare PPO | Admitting: Orthopedic Surgery

## 2018-11-13 ENCOUNTER — Encounter: Payer: Self-pay | Admitting: Orthopedic Surgery

## 2018-11-13 VITALS — Ht 67.0 in | Wt 241.0 lb

## 2018-11-13 DIAGNOSIS — M170 Bilateral primary osteoarthritis of knee: Secondary | ICD-10-CM | POA: Diagnosis not present

## 2018-11-15 ENCOUNTER — Encounter: Payer: Self-pay | Admitting: Orthopedic Surgery

## 2018-11-15 DIAGNOSIS — M170 Bilateral primary osteoarthritis of knee: Secondary | ICD-10-CM | POA: Diagnosis not present

## 2018-11-15 NOTE — Progress Notes (Signed)
Office Visit Note   Patient: Joel King           Date of Birth: April 24, 1950           MRN: [id number] Visit Date: 11/13/2018              Requested by: Biagio Borg, MD [place],  [place] [address] PCP: Biagio Borg, MD  Chief Complaint  Patient presents with  . Right Knee - Follow-up, Pain  . Left Knee - Follow-up, Pain      HPI: Patient is a 68 year old gentleman with osteoarthritis both knees.  Patient states his pain level is currently 10/10.  He states that steroid injections have helped in the past.  Assessment & Plan: Visit Diagnoses:  1. Bilateral primary osteoarthritis of knee     Plan: Both knees were injected he tolerated this well.  Follow-Up Instructions: Return if symptoms worsen or fail to improve.   Ortho Exam  Patient is alert, oriented, no adenopathy, well-dressed, normal affect, normal respiratory effort. Examination patient has a slow antalgic gait.  He has venous stasis swelling in both legs with pitting edema there are no ulcers.  He is currently wearing compression socks.  Both knees are globally tender to palpation with crepitation with range of motion collaterals are cruciates are stable.  Imaging: No results found. No images are attached to the encounter.  Labs: Lab Results  Component Value Date   HGBA1C 5.7 10/09/2018   HGBA1C 5.6 08/12/2017   HGBA1C 5.4 08/05/2016     Lab Results  Component Value Date   ALBUMIN 4.6 10/09/2018   ALBUMIN 4.7 08/12/2017   ALBUMIN 4.7 08/05/2016    No results found for: MG No results found for: VD25OH  No results found for: PREALBUMIN CBC EXTENDED Latest Ref Rng & Units 10/09/2018 08/12/2017 08/05/2016  WBC 4.0 - 10.5 K/uL 9.7 11.2(H) 10.3  RBC 4.22 - 5.81 Mil/uL 5.10 5.36 5.13  HGB 13.0 - 17.0 g/dL 16.4 17.3(H) 16.6  HCT 39.0 - 52.0 % 47.4 49.2 46.9  PLT 150.0 - 400.0 K/uL 208.0 219.0 204.0  NEUTROABS 1.4 - 7.7 K/uL 7.2 8.7(H) 7.7  LYMPHSABS 0.7 - 4.0 K/uL 1.3 1.2 1.3      Body mass index is 37.75 kg/m.  Orders:  No orders of the defined types were placed in this encounter.  No orders of the defined types were placed in this encounter.    Procedures: Large Joint Inj: bilateral knee on 11/15/2018 2:11 PM Indications: pain and diagnostic evaluation Details: 22 G 1.5 in needle, anteromedial approach  Arthrogram: No  Outcome: tolerated well, no immediate complications Procedure, treatment alternatives, risks and benefits explained, specific risks discussed. Consent was given by the patient. Immediately prior to procedure a time out was called to verify the correct patient, procedure, equipment, support staff and site/side marked as required. Patient was prepped and draped in the usual sterile fashion.      Clinical Data: No additional findings.  ROS:  All other systems negative, except as noted in the HPI. Review of Systems  Objective: Vital Signs: Ht 5\' 7"  (1.702 m)   Wt 241 lb (109.3 kg)   BMI 37.75 kg/m   Specialty Comments:  No specialty comments available.  PMFS History: Patient Active Problem List   Diagnosis Date Noted  . Cough 03/15/2018  . BPH (benign prostatic hyperplasia) 08/13/2017  . Effusion, right knee 12/13/2016  . Bilateral primary osteoarthritis of knee 08/24/2016  .  Plantar fasciitis 03/16/2016  . Achilles tendinitis, right leg 03/16/2016  . Rash 01/15/2016  . Stasis dermatitis 01/15/2016  . Chronic venous insufficiency 12/30/2015  . Cellulitis 12/30/2015  . Itching 12/09/2015  . Dizziness 03/25/2015  . Skin lesion of face 03/25/2015  . Abnormal liver function 03/25/2015  . Hypokalemia 03/25/2015  . Acute upper respiratory infection 01/14/2015  . Neuritis of right lower extremity 08/20/2014  . DVT (deep venous thrombosis) ([place]) 02/08/2014  . Right knee pain 12/22/2013  . Fatigue 11/02/2010  . Preventative health care 11/01/2010  . DEPRESSION 05/31/2008  . Diarrhea 05/31/2008  . SKIN LESION  12/08/2007  . Pre-diabetes 07/22/2007  . PEPTIC ULCER DISEASE 07/22/2007  . INSOMNIA-SLEEP DISORDER-UNSPEC 07/21/2007  . Hyperlipidemia 11/21/2006  . Anxiety state 11/21/2006  . OBESITY, MILD 11/17/2006  . Essential hypertension 11/17/2006   Past Medical History:  Diagnosis Date  . ANXIETY 11/21/2006  . Cancer ([place])    skin cancer left cheek  . DEPRESSION 05/31/2008  . DIABETES MELLITUS, TYPE II 07/22/2007   no meds  . Diarrhea 05/31/2008  . DVT (deep venous thrombosis) ([place]) 02/08/2014  . HYPERLIPIDEMIA 11/21/2006  . HYPERTENSION 11/17/2006  . INSOMNIA-SLEEP DISORDER-UNSPEC 07/21/2007  . OBESITY, MILD 11/17/2006  . PEPTIC ULCER DISEASE 07/22/2007  . PEPTIC ULCER DISEASE, HX OF 11/17/2006  . Post-operative nausea and vomiting    past 2 surgeries  . SINUSITIS- ACUTE-NOS 12/08/2007  . SKIN LESION 12/08/2007    Family History  Problem Relation Age of Onset  . Heart disease Mother   . Stroke Other   . Cancer Other        prostate  . Heart disease Father   . COPD Brother     Past Surgical History:  Procedure Laterality Date  . inguinal herniorrhapy     bilat  . s/p incarcerated recurrent ventral hernia    . s/p meckel's diverticulum     Social History   Occupational History  . Not on file  Tobacco Use  . Smoking status: Never Smoker  . Smokeless tobacco: Never Used  Substance and Sexual Activity  . Alcohol use: No  . Drug use: No  . Sexual activity: Not on file

## 2019-01-08 ENCOUNTER — Other Ambulatory Visit: Payer: Self-pay

## 2019-01-08 ENCOUNTER — Encounter: Payer: Self-pay | Admitting: Orthopedic Surgery

## 2019-01-08 ENCOUNTER — Ambulatory Visit (INDEPENDENT_AMBULATORY_CARE_PROVIDER_SITE_OTHER): Payer: Medicare PPO | Admitting: Orthopedic Surgery

## 2019-01-08 VITALS — Ht 67.0 in | Wt 241.0 lb

## 2019-01-08 DIAGNOSIS — M170 Bilateral primary osteoarthritis of knee: Secondary | ICD-10-CM | POA: Diagnosis not present

## 2019-01-08 NOTE — Progress Notes (Signed)
Office Visit Note   Patient: Joel King           Date of Birth: April 17, 1950           MRN: [id number] Visit Date: 01/08/2019              Requested by: Biagio Borg, MD [place],   [id number] PCP: Biagio Borg, MD  Chief Complaint  Patient presents with  . Left Knee - Pain, Follow-up  . Right Knee - Pain, Follow-up      HPI: Patient is a 68 year old gentleman who presents complaining of bilateral knee pain.  He has had good temporary relief with previous steroid injections.  He states he is not wearing any brace he states he cannot stand for prolonged periods of time.  Assessment & Plan: Visit Diagnoses:  1. Bilateral primary osteoarthritis of knee     Plan: Both knees were injected he tolerated this well he will follow-up with [place] in 3 months.  Follow-Up Instructions: Return in about 3 months (around 04/10/2019).   Ortho Exam  Patient is alert, oriented, no adenopathy, well-dressed, normal affect, normal respiratory effort. Examination patient has an antalgic gait he has mild swelling of both knees there is no redness no cellulitis in either knee.  He does have pitting edema of both legs with venous insufficiency but no venous ulcers.  Both knees have crepitation with range of motion collaterals and cruciates are stable bilaterally medially and laterally joint lines are tender to palpation.  Imaging: No results found. No images are attached to the encounter.  Labs: Lab Results  Component Value Date   HGBA1C 5.7 10/09/2018   HGBA1C 5.6 08/12/2017   HGBA1C 5.4 08/05/2016     Lab Results  Component Value Date   ALBUMIN 4.6 10/09/2018   ALBUMIN 4.7 08/12/2017   ALBUMIN 4.7 08/05/2016    No results found for: MG No results found for: VD25OH  No results found for: PREALBUMIN CBC EXTENDED Latest Ref Rng & Units 10/09/2018 08/12/2017 08/05/2016  WBC 4.0 - 10.5 K/uL 9.7 11.2(H) 10.3  RBC 4.22 - 5.81 Mil/uL 5.10 5.36 5.13  HGB 13.0 -  17.0 g/dL 16.4 17.3(H) 16.6  HCT 39.0 - 52.0 % 47.4 49.2 46.9  PLT 150.0 - 400.0 K/uL 208.0 219.0 204.0  NEUTROABS 1.4 - 7.7 K/uL 7.2 8.7(H) 7.7  LYMPHSABS 0.7 - 4.0 K/uL 1.3 1.2 1.3     Body mass index is 37.75 kg/m.  Orders:  No orders of the defined types were placed in this encounter.  No orders of the defined types were placed in this encounter.    Procedures: Large Joint Inj: bilateral knee on 01/08/2019 11:04 AM Indications: pain and diagnostic evaluation Details: 22 G 1.5 in needle, anteromedial approach  Arthrogram: No  Outcome: tolerated well, no immediate complications Procedure, treatment alternatives, risks and benefits explained, specific risks discussed. Consent was given by the patient. Immediately prior to procedure a time out was called to verify the correct patient, procedure, equipment, support staff and site/side marked as required. Patient was prepped and draped in the usual sterile fashion.      Clinical Data: No additional findings.  ROS:  All other systems negative, except as noted in the HPI. Review of Systems  Objective: Vital Signs: Ht 5\' 7"  (1.702 m)   Wt 241 lb (109.3 kg)   BMI 37.75 kg/m   Specialty Comments:  No specialty comments available.  PMFS History: Patient Active  Problem List   Diagnosis Date Noted  . Cough 03/15/2018  . BPH (benign prostatic hyperplasia) 08/13/2017  . Effusion, right knee 12/13/2016  . Bilateral primary osteoarthritis of knee 08/24/2016  . Plantar fasciitis 03/16/2016  . Achilles tendinitis, right leg 03/16/2016  . Rash 01/15/2016  . Stasis dermatitis 01/15/2016  . Chronic venous insufficiency 12/30/2015  . Cellulitis 12/30/2015  . Itching 12/09/2015  . Dizziness 03/25/2015  . Skin lesion of face 03/25/2015  . Abnormal liver function 03/25/2015  . Hypokalemia 03/25/2015  . Acute upper respiratory infection 01/14/2015  . Neuritis of right lower extremity 08/20/2014  . DVT (deep venous  thrombosis) ([place]) 02/08/2014  . Right knee pain 12/22/2013  . Fatigue 11/02/2010  . Preventative health care 11/01/2010  . DEPRESSION 05/31/2008  . Diarrhea 05/31/2008  . SKIN LESION 12/08/2007  . Pre-diabetes 07/22/2007  . PEPTIC ULCER DISEASE 07/22/2007  . INSOMNIA-SLEEP DISORDER-UNSPEC 07/21/2007  . Hyperlipidemia 11/21/2006  . Anxiety state 11/21/2006  . OBESITY, MILD 11/17/2006  . Essential hypertension 11/17/2006   Past Medical History:  Diagnosis Date  . ANXIETY 11/21/2006  . Cancer ([place])    skin cancer left cheek  . DEPRESSION 05/31/2008  . DIABETES MELLITUS, TYPE II 07/22/2007   no meds  . Diarrhea 05/31/2008  . DVT (deep venous thrombosis) ([place]) 02/08/2014  . HYPERLIPIDEMIA 11/21/2006  . HYPERTENSION 11/17/2006  . INSOMNIA-SLEEP DISORDER-UNSPEC 07/21/2007  . OBESITY, MILD 11/17/2006  . PEPTIC ULCER DISEASE 07/22/2007  . PEPTIC ULCER DISEASE, HX OF 11/17/2006  . Post-operative nausea and vomiting    past 2 surgeries  . SINUSITIS- ACUTE-NOS 12/08/2007  . SKIN LESION 12/08/2007    Family History  Problem Relation Age of Onset  . Heart disease Mother   . Stroke Other   . Cancer Other        prostate  . Heart disease Father   . COPD Brother     Past Surgical History:  Procedure Laterality Date  . inguinal herniorrhapy     bilat  . s/p incarcerated recurrent ventral hernia    . s/p meckel's diverticulum     Social History   Occupational History  . Not on file  Tobacco Use  . Smoking status: Never Smoker  . Smokeless tobacco: Never Used  Substance and Sexual Activity  . Alcohol use: No  . Drug use: No  . Sexual activity: Not on file

## 2019-01-15 DIAGNOSIS — B029 Zoster without complications: Secondary | ICD-10-CM | POA: Diagnosis not present

## 2019-01-16 ENCOUNTER — Encounter: Payer: Medicare PPO | Admitting: Internal Medicine

## 2019-01-21 ENCOUNTER — Encounter: Payer: Self-pay | Admitting: Internal Medicine

## 2019-01-22 NOTE — Telephone Encounter (Signed)
I think should be ok, thanks

## 2019-02-06 ENCOUNTER — Ambulatory Visit (INDEPENDENT_AMBULATORY_CARE_PROVIDER_SITE_OTHER): Payer: Medicare PPO | Admitting: Internal Medicine

## 2019-02-06 ENCOUNTER — Encounter: Payer: Self-pay | Admitting: Internal Medicine

## 2019-02-06 ENCOUNTER — Other Ambulatory Visit: Payer: Self-pay

## 2019-02-06 VITALS — BP 124/86 | HR 60 | Temp 98.6°F | Ht 67.0 in | Wt 240.0 lb

## 2019-02-06 DIAGNOSIS — R42 Dizziness and giddiness: Secondary | ICD-10-CM | POA: Diagnosis not present

## 2019-02-06 DIAGNOSIS — R7303 Prediabetes: Secondary | ICD-10-CM | POA: Diagnosis not present

## 2019-02-06 DIAGNOSIS — I10 Essential (primary) hypertension: Secondary | ICD-10-CM

## 2019-02-06 MED ORDER — MECLIZINE HCL 12.5 MG PO TABS: 12.5000 mg | ORAL_TABLET | Freq: Three times a day (TID) | ORAL | 1 refills | Status: AC | PRN

## 2019-02-06 NOTE — Progress Notes (Signed)
Subjective:    Patient ID: Joel King, male    DOB: 11-16-1950, 68 y.o.   MRN: [id number]  HPI  Here to f/u; overall doing ok,  Pt denies chest pain, increasing sob or doe, wheezing, orthopnea, PND, increased LE swelling, palpitations, or syncope, but has had recurring episodes vertigo when getting upt o BR in last 3 nights, only happens with change in position.  Pt denies new neurological symptoms such as new headache, or facial or extremity weakness or numbness.  Pt denies polydipsia, polyuria, or low sugar episode.  Pt states overall good compliance with meds, mostly trying to follow appropriate diet, with wt overall stable.   Pt denies fever, wt loss, night sweats, loss of appetite, or other constitutional symptoms, denies ST, cough   Past Medical History:  Diagnosis Date  . ANXIETY 11/21/2006  . Cancer ([place])    skin cancer left cheek  . DEPRESSION 05/31/2008  . DIABETES MELLITUS, TYPE II 07/22/2007   no meds  . Diarrhea 05/31/2008  . DVT (deep venous thrombosis) ([place]) 02/08/2014  . HYPERLIPIDEMIA 11/21/2006  . HYPERTENSION 11/17/2006  . INSOMNIA-SLEEP DISORDER-UNSPEC 07/21/2007  . OBESITY, MILD 11/17/2006  . PEPTIC ULCER DISEASE 07/22/2007  . PEPTIC ULCER DISEASE, HX OF 11/17/2006  . Post-operative nausea and vomiting    past 2 surgeries  . SINUSITIS- ACUTE-NOS 12/08/2007  . SKIN LESION 12/08/2007   Past Surgical History:  Procedure Laterality Date  . inguinal herniorrhapy     bilat  . s/p incarcerated recurrent ventral hernia    . s/p meckel's diverticulum      reports that he has never smoked. He has never used smokeless tobacco. He reports that he does not drink alcohol or use drugs. family history includes COPD in his brother; Cancer in an other family member; Heart disease in his father and mother; Stroke in an other family member. Allergies  Allergen Reactions  . Atorvastatin     Unsure of results  . Pravastatin Sodium     Unsure of reaction  . Rosuvastatin    Unsure of reaction   Current Outpatient Medications on File Prior to Visit  Medication Sig Dispense Refill  . amLODipine (NORVASC) 10 MG tablet Take 1 tablet (10 mg total) by mouth daily. 90 tablet 3  . aspirin 81 MG tablet Take 81 mg by mouth daily.      Marland Kitchen atenolol-chlorthalidone (TENORETIC) 100-25 MG tablet Take 1 tablet by mouth daily. 90 tablet 3  . benazepril (LOTENSIN) 40 MG tablet Take 1 tablet (40 mg total) by mouth daily. 90 tablet 3  . clobetasol cream (TEMOVATE) [id number] % Apply 1 application topically 2 (two) times daily. 30 g 1  . famotidine (PEPCID) 20 MG tablet Take 1 tablet (20 mg total) by mouth 2 (two) times daily. 180 tablet 3  . finasteride (PROSCAR) 5 MG tablet Take 1 tablet (5 mg total) by mouth daily. 90 tablet 3  . guaiFENesin (MUCINEX) 600 MG 12 hr tablet Take 1 tablet (600 mg total) by mouth 2 (two) times daily as needed. 60 tablet 1  . KRILL OIL PO Take 500 mg by mouth daily at 6 (six) AM.    . Mirabegron (MYRBETRIQ PO) Take by mouth.    . Multiple Vitamins-Minerals (CENTRUM SILVER PO) Take by mouth daily at 6 (six) AM.    . potassium chloride (K-DUR) 10 MEQ tablet 2 tabs by mouth daily 180 tablet 3  . potassium chloride (K-DUR) 10 MEQ tablet TAKE 2 TABLETS EVERY DAY  180 tablet 3  . simvastatin (ZOCOR) 20 MG tablet TAKE 1 TABLET EVERY DAY 90 tablet 3  . traZODone (DESYREL) 50 MG tablet Take 0.5-1 tablets (25-50 mg total) by mouth at bedtime as needed for sleep. 90 tablet 1   No current facility-administered medications on file prior to visit.    Review of Systems  Constitutional: Negative for other unusual diaphoresis or sweats HENT: Negative for ear discharge or swelling Eyes: Negative for other worsening visual disturbances Respiratory: Negative for stridor or other swelling  Gastrointestinal: Negative for worsening distension or other blood Genitourinary: Negative for retention or other urinary change Musculoskeletal: Negative for other MSK pain or swelling  Skin: Negative for color change or other new lesions Neurological: Negative for worsening tremors and other numbness  Psychiatric/Behavioral: Negative for worsening agitation or other fatigue All otherwise neg per pt     Objective:   Physical Exam BP 124/86   Pulse 60   Temp 98.6 F (37 C) (Oral)   Ht 5\' 7"  (1.702 m)   Wt 240 lb (108.9 kg)   SpO2 96%   BMI 37.59 kg/m  VS noted,  Constitutional: Pt appears in NAD HENT: Head: NCAT.  Right Ear: External ear normal.  Left Ear: External ear normal.  Eyes: . Pupils are equal, round, and reactive to light. Conjunctivae and EOM are normal Nose: without d/c or deformity Neck: Neck supple. Gross normal ROM Cardiovascular: Normal rate and regular rhythm.   Pulmonary/Chest: Effort normal and breath sounds without rales or wheezing.  Abd:  Soft, NT, ND, + BS, no organomegaly Neurological: Pt is alert. At baseline orientation, motor grossly intact Skin: Skin is warm. No rashes, other new lesions, no LE edema Psychiatric: Pt behavior is normal without agitation  All otherwise neg per pt Lab Results  Component Value Date   WBC 9.7 10/09/2018   HGB 16.4 10/09/2018   HCT 47.4 10/09/2018   PLT 208.0 10/09/2018   GLUCOSE 140 (H) 10/09/2018   CHOL 147 10/09/2018   TRIG 146.0 10/09/2018   HDL 33.60 (L) 10/09/2018   LDLDIRECT 106.0 08/05/2016   LDLCALC 84 10/09/2018   ALT 18 10/09/2018   AST 15 10/09/2018   NA 140 10/09/2018   K 3.3 (L) 10/09/2018   CL 101 10/09/2018   CREATININE 0.87 10/09/2018   BUN 20 10/09/2018   CO2 28 10/09/2018   TSH 1.49 10/09/2018   PSA 1.12 10/09/2018   HGBA1C 5.7 10/09/2018   MICROALBUR 4.6 (H) 12/09/2015        Assessment & Plan:

## 2019-02-06 NOTE — Patient Instructions (Signed)
Please take all new medication as prescribed - the vertigo  Please return for any worsening, to consider Brain MRI and/or Vestibular Rehab (a kind of Physical Therapy)  Please continue all other medications as before, and refills have been done if requested.  Please have the pharmacy call with any other refills you may need.  Please continue your efforts at being more active, low cholesterol diet, and weight control.  Please keep your appointments with your specialists as you may have planned

## 2019-02-11 ENCOUNTER — Encounter: Payer: Self-pay | Admitting: Internal Medicine

## 2019-02-11 NOTE — Assessment & Plan Note (Signed)
mild recurring, for meclizine prn, consider valium and.or vestibular rehab if worsening

## 2019-02-11 NOTE — Assessment & Plan Note (Signed)
stable overall by history and exam, recent data reviewed with pt, and pt to continue medical treatment as before,  to f/u any worsening symptoms or concerns  

## 2019-02-12 ENCOUNTER — Ambulatory Visit: Payer: Medicare PPO | Admitting: Orthopedic Surgery

## 2019-02-12 ENCOUNTER — Other Ambulatory Visit: Payer: Self-pay | Admitting: Internal Medicine

## 2019-03-20 DIAGNOSIS — H52223 Regular astigmatism, bilateral: Secondary | ICD-10-CM | POA: Diagnosis not present

## 2019-03-20 DIAGNOSIS — H5213 Myopia, bilateral: Secondary | ICD-10-CM | POA: Diagnosis not present

## 2019-03-20 DIAGNOSIS — H25093 Other age-related incipient cataract, bilateral: Secondary | ICD-10-CM | POA: Diagnosis not present

## 2019-03-20 DIAGNOSIS — H524 Presbyopia: Secondary | ICD-10-CM | POA: Diagnosis not present

## 2019-04-09 ENCOUNTER — Ambulatory Visit: Payer: Medicare PPO | Admitting: Orthopedic Surgery

## 2019-04-13 ENCOUNTER — Telehealth: Payer: Self-pay | Admitting: Internal Medicine

## 2019-04-13 MED ORDER — POTASSIUM CHLORIDE CRYS ER 10 MEQ PO TBCR: 20.0000 meq | EXTENDED_RELEASE_TABLET | Freq: Every day | ORAL | 0 refills | Status: DC

## 2019-04-13 NOTE — Telephone Encounter (Signed)
Copied from [place] [phone]. Topic: General - Other >> Apr 13, 2019  8:23 AM Keene Breath wrote: Reason for CRM: Patient would like the doctor to send a small amount of his potassium chloride (K-DUR) 10 MEQ tablet to his local pharmacy until he receives it from [place].  He has run out of the medication and does not want to be without over the weekend.  CB#  [phone]

## 2019-04-13 NOTE — Telephone Encounter (Signed)
Pt informed of below.  

## 2019-04-22 ENCOUNTER — Ambulatory Visit: Payer: Medicare PPO | Attending: Internal Medicine

## 2019-04-22 DIAGNOSIS — Z23 Encounter for immunization: Secondary | ICD-10-CM | POA: Insufficient documentation

## 2019-04-22 NOTE — Progress Notes (Signed)
   Covid-19 Vaccination Clinic  Name:  Joel King    MRN: [id number] DOB: 26-Apr-1950  04/22/2019  [name] was observed post Covid-19 immunization for 15 minutes without incidence. He was provided with Vaccine Information Sheet and instruction to access the V-Safe system.   [name] was instructed to call 911 with any severe reactions post vaccine: Marland Kitchen Difficulty breathing  . Swelling of your face and throat  . A fast heartbeat  . A bad rash all over your body  . Dizziness and weakness    Immunizations Administered    Name Date Dose VIS Date Route   Pfizer COVID-19 Vaccine 04/22/2019 11:21 AM 0.3 mL 03/09/2019 Intramuscular   Manufacturer: [place]   Lot: [id number]   [place]: [id number]

## 2019-05-14 ENCOUNTER — Ambulatory Visit: Payer: Medicare PPO | Attending: Internal Medicine

## 2019-05-14 DIAGNOSIS — Z23 Encounter for immunization: Secondary | ICD-10-CM | POA: Insufficient documentation

## 2019-05-14 NOTE — Progress Notes (Signed)
   Covid-19 Vaccination Clinic  Name:  Joel King    MRN: [id number] DOB: 01/25/1951  05/14/2019  [name] was observed post Covid-19 immunization for 15 minutes without incidence. He was provided with Vaccine Information Sheet and instruction to access the V-Safe system.   [name] was instructed to call 911 with any severe reactions post vaccine: Marland Kitchen Difficulty breathing  . Swelling of your face and throat  . A fast heartbeat  . A bad rash all over your body  . Dizziness and weakness    Immunizations Administered    Name Date Dose VIS Date Route   Pfizer COVID-19 Vaccine 05/14/2019  8:42 AM 0.3 mL 03/09/2019 Intramuscular   Manufacturer: [place]   Lot: [id number]   [place]: [id number]

## 2019-05-29 DIAGNOSIS — R351 Nocturia: Secondary | ICD-10-CM | POA: Diagnosis not present

## 2019-05-29 DIAGNOSIS — R8271 Bacteriuria: Secondary | ICD-10-CM | POA: Diagnosis not present

## 2019-05-29 DIAGNOSIS — N401 Enlarged prostate with lower urinary tract symptoms: Secondary | ICD-10-CM | POA: Diagnosis not present

## 2019-05-29 DIAGNOSIS — R972 Elevated prostate specific antigen [PSA]: Secondary | ICD-10-CM | POA: Diagnosis not present

## 2019-07-30 ENCOUNTER — Ambulatory Visit: Payer: Medicare PPO | Admitting: Orthopedic Surgery

## 2019-07-30 ENCOUNTER — Encounter: Payer: Self-pay | Admitting: Orthopedic Surgery

## 2019-07-30 ENCOUNTER — Other Ambulatory Visit: Payer: Self-pay

## 2019-07-30 VITALS — Ht 67.0 in | Wt 240.0 lb

## 2019-07-30 DIAGNOSIS — M170 Bilateral primary osteoarthritis of knee: Secondary | ICD-10-CM

## 2019-07-30 NOTE — Progress Notes (Signed)
Office Visit Note   Patient: Joel King           Date of Birth: Sep 22, 1950           MRN: [id number] Visit Date: 07/30/2019              Requested by: Biagio Borg, MD [place],   [id number] PCP: Biagio Borg, MD  Chief Complaint  Patient presents with  . Left Knee - Pain    S/p cortisone injections bilateral knees 01/08/20  . Right Knee - Pain      HPI: Patient is a 69 year old gentleman who presents in follow-up for osteoarthritis bilateral knees he has had relief with previous injections. Last injection was about 6 months ago.  Assessment & Plan: Visit Diagnoses:  1. Bilateral primary osteoarthritis of knee     Plan: Patient underwent injection for both knees he tolerated this well follow-up as needed with repeat injections. Discussed that if injections fail to work that we could proceed with considering total knee arthroplasty discussed the risks associated with total knee surgery.  Follow-Up Instructions: Return if symptoms worsen or fail to improve.   Ortho Exam  Patient is alert, oriented, no adenopathy, well-dressed, normal affect, normal respiratory effort. Examination patient has an antalgic gait he has venous swelling of both lower extremities. There is no redness or cellulitis of either knee. He is tender to palpation of medial lateral joint lines bilaterally collaterals and cruciates are stable he has crepitation with range of motion of both knees.  Imaging: No results found. No images are attached to the encounter.  Labs: Lab Results  Component Value Date   HGBA1C 5.7 10/09/2018   HGBA1C 5.6 08/12/2017   HGBA1C 5.4 08/05/2016     Lab Results  Component Value Date   ALBUMIN 4.6 10/09/2018   ALBUMIN 4.7 08/12/2017   ALBUMIN 4.7 08/05/2016    No results found for: MG No results found for: VD25OH  No results found for: PREALBUMIN CBC EXTENDED Latest Ref Rng & Units 10/09/2018 08/12/2017 08/05/2016  WBC 4.0 - 10.5  K/uL 9.7 11.2(H) 10.3  RBC 4.22 - 5.81 Mil/uL 5.10 5.36 5.13  HGB 13.0 - 17.0 g/dL 16.4 17.3(H) 16.6  HCT 39.0 - 52.0 % 47.4 49.2 46.9  PLT 150.0 - 400.0 K/uL 208.0 219.0 204.0  NEUTROABS 1.4 - 7.7 K/uL 7.2 8.7(H) 7.7  LYMPHSABS 0.7 - 4.0 K/uL 1.3 1.2 1.3     Body mass index is 37.59 kg/m.  Orders:  No orders of the defined types were placed in this encounter.  No orders of the defined types were placed in this encounter.    Procedures: Large Joint Inj: bilateral knee on 07/30/2019 8:48 AM Indications: pain and diagnostic evaluation Details: 22 G 1.5 in needle, anteromedial approach  Arthrogram: No  Outcome: tolerated well, no immediate complications Procedure, treatment alternatives, risks and benefits explained, specific risks discussed. Consent was given by the patient. Immediately prior to procedure a time out was called to verify the correct patient, procedure, equipment, support staff and site/side marked as required. Patient was prepped and draped in the usual sterile fashion.      Clinical Data: No additional findings.  ROS:  All other systems negative, except as noted in the HPI. Review of Systems  Objective: Vital Signs: Ht 5\' 7"  (1.702 m)   Wt 240 lb (108.9 kg)   BMI 37.59 kg/m   Specialty Comments:  No specialty comments available.  PMFS History:  Patient Active Problem List   Diagnosis Date Noted  . Vertigo 02/06/2019  . Cough 03/15/2018  . BPH (benign prostatic hyperplasia) 08/13/2017  . Effusion, right knee 12/13/2016  . Bilateral primary osteoarthritis of knee 08/24/2016  . Plantar fasciitis 03/16/2016  . Achilles tendinitis, right leg 03/16/2016  . Rash 01/15/2016  . Stasis dermatitis 01/15/2016  . Chronic venous insufficiency 12/30/2015  . Cellulitis 12/30/2015  . Itching 12/09/2015  . Dizziness 03/25/2015  . Skin lesion of face 03/25/2015  . Abnormal liver function 03/25/2015  . Hypokalemia 03/25/2015  . Acute upper respiratory  infection 01/14/2015  . Neuritis of right lower extremity 08/20/2014  . DVT (deep venous thrombosis) ([place]) 02/08/2014  . Right knee pain 12/22/2013  . Fatigue 11/02/2010  . Preventative health care 11/01/2010  . DEPRESSION 05/31/2008  . Diarrhea 05/31/2008  . SKIN LESION 12/08/2007  . Pre-diabetes 07/22/2007  . PEPTIC ULCER DISEASE 07/22/2007  . INSOMNIA-SLEEP DISORDER-UNSPEC 07/21/2007  . Hyperlipidemia 11/21/2006  . Anxiety state 11/21/2006  . OBESITY, MILD 11/17/2006  . Essential hypertension 11/17/2006   Past Medical History:  Diagnosis Date  . ANXIETY 11/21/2006  . Cancer ([place])    skin cancer left cheek  . DEPRESSION 05/31/2008  . DIABETES MELLITUS, TYPE II 07/22/2007   no meds  . Diarrhea 05/31/2008  . DVT (deep venous thrombosis) ([place]) 02/08/2014  . HYPERLIPIDEMIA 11/21/2006  . HYPERTENSION 11/17/2006  . INSOMNIA-SLEEP DISORDER-UNSPEC 07/21/2007  . OBESITY, MILD 11/17/2006  . PEPTIC ULCER DISEASE 07/22/2007  . PEPTIC ULCER DISEASE, HX OF 11/17/2006  . Post-operative nausea and vomiting    past 2 surgeries  . SINUSITIS- ACUTE-NOS 12/08/2007  . SKIN LESION 12/08/2007    Family History  Problem Relation Age of Onset  . Heart disease Mother   . Stroke Other   . Cancer Other        prostate  . Heart disease Father   . COPD Brother     Past Surgical History:  Procedure Laterality Date  . inguinal herniorrhapy     bilat  . s/p incarcerated recurrent ventral hernia    . s/p meckel's diverticulum     Social History   Occupational History  . Not on file  Tobacco Use  . Smoking status: Never Smoker  . Smokeless tobacco: Never Used  Substance and Sexual Activity  . Alcohol use: No  . Drug use: No  . Sexual activity: Not on file

## 2019-09-05 ENCOUNTER — Other Ambulatory Visit: Payer: Self-pay | Admitting: Internal Medicine

## 2019-09-05 NOTE — Telephone Encounter (Signed)
Done erx 

## 2019-09-18 DIAGNOSIS — M7661 Achilles tendinitis, right leg: Secondary | ICD-10-CM

## 2019-09-21 ENCOUNTER — Ambulatory Visit: Payer: Medicare PPO | Admitting: Podiatry

## 2019-10-02 DIAGNOSIS — H2512 Age-related nuclear cataract, left eye: Secondary | ICD-10-CM | POA: Diagnosis not present

## 2019-10-02 DIAGNOSIS — H25043 Posterior subcapsular polar age-related cataract, bilateral: Secondary | ICD-10-CM | POA: Diagnosis not present

## 2019-10-02 DIAGNOSIS — H25013 Cortical age-related cataract, bilateral: Secondary | ICD-10-CM | POA: Diagnosis not present

## 2019-10-02 DIAGNOSIS — H2513 Age-related nuclear cataract, bilateral: Secondary | ICD-10-CM | POA: Diagnosis not present

## 2019-10-02 DIAGNOSIS — H18413 Arcus senilis, bilateral: Secondary | ICD-10-CM | POA: Diagnosis not present

## 2019-10-10 DIAGNOSIS — M79676 Pain in unspecified toe(s): Secondary | ICD-10-CM

## 2019-10-15 ENCOUNTER — Other Ambulatory Visit: Payer: Self-pay

## 2019-10-15 ENCOUNTER — Other Ambulatory Visit: Payer: Medicare PPO

## 2019-10-15 DIAGNOSIS — E538 Deficiency of other specified B group vitamins: Secondary | ICD-10-CM | POA: Diagnosis not present

## 2019-10-15 DIAGNOSIS — Z0000 Encounter for general adult medical examination without abnormal findings: Secondary | ICD-10-CM | POA: Diagnosis not present

## 2019-10-15 DIAGNOSIS — E559 Vitamin D deficiency, unspecified: Secondary | ICD-10-CM

## 2019-10-15 DIAGNOSIS — R7303 Prediabetes: Secondary | ICD-10-CM | POA: Diagnosis not present

## 2019-10-15 DIAGNOSIS — E611 Iron deficiency: Secondary | ICD-10-CM | POA: Diagnosis not present

## 2019-10-15 NOTE — Addendum Note (Signed)
Addended by: Cresenciano Lick on: 10/15/2019 09:56 AM   Modules accepted: Orders

## 2019-10-15 NOTE — Addendum Note (Signed)
Addended by: Cresenciano Lick on: 10/15/2019 09:57 AM   Modules accepted: Orders

## 2019-10-16 ENCOUNTER — Telehealth: Payer: Self-pay | Admitting: Internal Medicine

## 2019-10-16 ENCOUNTER — Other Ambulatory Visit: Payer: Self-pay | Admitting: Internal Medicine

## 2019-10-16 LAB — LIPID PANEL
Cholesterol: 179 mg/dL (ref <200)
HDL: 35 mg/dL — ABNORMAL LOW (ref >=40)
LDL Cholesterol (Calc): 109 mg/dL (calc) — ABNORMAL HIGH
Non-HDL Cholesterol (Calc): 144 mg/dL (calc) — ABNORMAL HIGH (ref <130)
Total CHOL/HDL Ratio: 5.1 (calc) — ABNORMAL HIGH (ref <5.0)
Triglycerides: 231 mg/dL — ABNORMAL HIGH (ref <150)

## 2019-10-16 LAB — HEPATIC FUNCTION PANEL
AG Ratio: 1.6 (calc) (ref 1.0–2.5)
ALT: 20 U/L (ref 9–46)
AST: 18 U/L (ref 10–35)
Albumin: 4.4 g/dL (ref 3.6–5.1)
Alkaline phosphatase (APISO): 59 U/L (ref 35–144)
Bilirubin, Direct: 0.2 mg/dL (ref 0.0–0.2)
Globulin: 2.8 g/dL (calc) (ref 1.9–3.7)
Indirect Bilirubin: 0.5 mg/dL (calc) (ref 0.2–1.2)
Total Bilirubin: 0.7 mg/dL (ref 0.2–1.2)
Total Protein: 7.2 g/dL (ref 6.1–8.1)

## 2019-10-16 LAB — HEMOGLOBIN A1C
Hgb A1c MFr Bld: 5.4 % of total Hgb (ref <5.7)
Mean Plasma Glucose: 108 (calc)
eAG (mmol/L): 6 (calc)

## 2019-10-16 LAB — CBC WITH DIFFERENTIAL/PLATELET
Absolute Monocytes: 883 cells/uL (ref 200–950)
Basophils Absolute: 27 cells/uL (ref 0–200)
Basophils Relative: 0.3 %
Eosinophils Absolute: 337 cells/uL (ref 15–500)
Eosinophils Relative: 3.7 %
HCT: 47.3 % (ref 38.5–50.0)
Hemoglobin: 16.5 g/dL (ref 13.2–17.1)
Lymphs Abs: 983 cells/uL (ref 850–3900)
MCH: 32.5 pg (ref 27.0–33.0)
MCHC: 34.9 g/dL (ref 32.0–36.0)
MCV: 93.3 fL (ref 80.0–100.0)
MPV: 10.8 fL (ref 7.5–12.5)
Monocytes Relative: 9.7 %
Neutro Abs: 6871 cells/uL (ref 1500–7800)
Neutrophils Relative %: 75.5 %
Platelets: 217 10*3/uL (ref 140–400)
RBC: 5.07 10*6/uL (ref 4.20–5.80)
RDW: 12.8 % (ref 11.0–15.0)
Total Lymphocyte: 10.8 %
WBC: 9.1 10*3/uL (ref 3.8–10.8)

## 2019-10-16 LAB — URINALYSIS, ROUTINE W REFLEX MICROSCOPIC
Bilirubin Urine: NEGATIVE
Glucose, UA: NEGATIVE
Hyaline Cast: NONE SEEN /LPF
Nitrite: POSITIVE — AB
Specific Gravity, Urine: 1.02 (ref 1.001–1.03)
Squamous Epithelial / HPF: NONE SEEN /HPF (ref <=5)
WBC, UA: >=60 /HPF — AB (ref 0–5)
pH: 7.5 (ref 5.0–8.0)

## 2019-10-16 LAB — VITAMIN D 25 HYDROXY (VIT D DEFICIENCY, FRACTURES): Vit D, 25-Hydroxy: 21 ng/mL — ABNORMAL LOW (ref 30–100)

## 2019-10-16 LAB — BASIC METABOLIC PANEL
BUN: 15 mg/dL (ref 7–25)
CO2: 30 mmol/L (ref 20–32)
Calcium: 9.8 mg/dL (ref 8.6–10.3)
Chloride: 100 mmol/L (ref 98–110)
Creat: 0.9 mg/dL (ref 0.70–1.25)
Glucose, Bld: 125 mg/dL — ABNORMAL HIGH (ref 65–99)
Potassium: 3.4 mmol/L — ABNORMAL LOW (ref 3.5–5.3)
Sodium: 138 mmol/L (ref 135–146)

## 2019-10-16 LAB — PSA: PSA: 1.1 ng/mL (ref <=4.0)

## 2019-10-16 LAB — TSH: TSH: 1.55 mIU/L (ref 0.40–4.50)

## 2019-10-16 LAB — VITAMIN B12: Vitamin B-12: 535 pg/mL (ref 200–1100)

## 2019-10-16 LAB — IRON, TOTAL/TOTAL IRON BINDING CAP: Iron: 166 ug/dL (ref 50–180)

## 2019-10-16 MED ORDER — CIPROFLOXACIN HCL 500 MG PO TABS
500.0000 mg | ORAL_TABLET | Freq: Two times a day (BID) | ORAL | 0 refills | Status: AC
Start: 2019-10-16 — End: 2019-10-26

## 2019-10-16 NOTE — Telephone Encounter (Signed)
New Message:   Pt is calling and states he would like to know why ciprofloxacin (CIPRO) 500 MG tablet was called in to the pharmacy for him. He states he did have labs on yesterday but no one has called as of yet to tell him the results. Please advise.

## 2019-10-17 NOTE — Telephone Encounter (Signed)
Pt has been informed that the antibiotic was sent in per PCP note that was left on MyChart. He expressed understanding.

## 2019-10-23 ENCOUNTER — Encounter: Payer: Self-pay | Admitting: Internal Medicine

## 2019-10-23 ENCOUNTER — Ambulatory Visit (INDEPENDENT_AMBULATORY_CARE_PROVIDER_SITE_OTHER): Payer: Medicare PPO | Admitting: Internal Medicine

## 2019-10-23 ENCOUNTER — Other Ambulatory Visit: Payer: Self-pay

## 2019-10-23 VITALS — BP 132/82 | HR 68 | Temp 98.7°F | Ht 67.0 in | Wt 247.0 lb

## 2019-10-23 DIAGNOSIS — E876 Hypokalemia: Secondary | ICD-10-CM

## 2019-10-23 DIAGNOSIS — E559 Vitamin D deficiency, unspecified: Secondary | ICD-10-CM

## 2019-10-23 DIAGNOSIS — N390 Urinary tract infection, site not specified: Secondary | ICD-10-CM | POA: Diagnosis not present

## 2019-10-23 DIAGNOSIS — R7303 Prediabetes: Secondary | ICD-10-CM

## 2019-10-23 DIAGNOSIS — Z0000 Encounter for general adult medical examination without abnormal findings: Secondary | ICD-10-CM | POA: Diagnosis not present

## 2019-10-23 DIAGNOSIS — E785 Hyperlipidemia, unspecified: Secondary | ICD-10-CM | POA: Diagnosis not present

## 2019-10-23 MED ORDER — SIMVASTATIN 40 MG PO TABS: 40.0000 mg | ORAL_TABLET | Freq: Every day | ORAL | 3 refills | Status: DC

## 2019-10-23 MED ORDER — POTASSIUM CHLORIDE ER 10 MEQ PO TBCR: 30.0000 meq | EXTENDED_RELEASE_TABLET | Freq: Every day | ORAL | 3 refills | Status: DC

## 2019-10-23 NOTE — Progress Notes (Signed)
Subjective:    Patient ID: Joel King, male    DOB: 1950/03/30, 69 y.o.   MRN: [id number]  HPI  Here for wellness and f/u;  Overall doing ok;  Pt denies Chest pain, worsening SOB, DOE, wheezing, orthopnea, PND, worsening LE edema, palpitations, dizziness or syncope.  Pt denies neurological change such as new headache, facial or extremity weakness.  Pt denies polydipsia, polyuria, or low sugar symptoms. Pt states overall good compliance with treatment and medications, good tolerability, and has been trying to follow appropriate diet.  Pt denies worsening depressive symptoms, suicidal ideation or panic. No fever, night sweats, wt loss, loss of appetite, or other constitutional symptoms.  Pt states good ability with ADL's, has low fall risk, home safety reviewed and adequate, no other significant changes in hearing or vision, and only occasionally active with exercise. Now laid off from most recent job, getting unemployment for total 16 wks. Needs to work due to expenses.  Due for cataracts due out next month.  BP and HR at home well controlled < 140/90, usually in the 110's.  Finishing antibx for incidental uti found on pre visit labs  Denies urinary symptoms such as dysuria, frequency, urgency, flank pain, hematuria or n/v, fever, chills. Past Medical History:  Diagnosis Date  . ANXIETY 11/21/2006  . Cancer ([place])    skin cancer left cheek  . DEPRESSION 05/31/2008  . DIABETES MELLITUS, TYPE II 07/22/2007   no meds  . Diarrhea 05/31/2008  . DVT (deep venous thrombosis) ([place]) 02/08/2014  . HYPERLIPIDEMIA 11/21/2006  . HYPERTENSION 11/17/2006  . INSOMNIA-SLEEP DISORDER-UNSPEC 07/21/2007  . OBESITY, MILD 11/17/2006  . PEPTIC ULCER DISEASE 07/22/2007  . PEPTIC ULCER DISEASE, HX OF 11/17/2006  . Post-operative nausea and vomiting    past 2 surgeries  . SINUSITIS- ACUTE-NOS 12/08/2007  . SKIN LESION 12/08/2007   Past Surgical History:  Procedure Laterality Date  . inguinal herniorrhapy     bilat    . s/p incarcerated recurrent ventral hernia    . s/p meckel's diverticulum      reports that he has never smoked. He has never used smokeless tobacco. He reports that he does not drink alcohol and does not use drugs. family history includes COPD in his brother; Cancer in an other family member; Heart disease in his father and mother; Stroke in an other family member. Allergies  Allergen Reactions  . Atorvastatin     Unsure of results  . Pravastatin Sodium     Unsure of reaction  . Rosuvastatin     Unsure of reaction   Current Outpatient Medications on File Prior to Visit  Medication Sig Dispense Refill  . amLODipine (NORVASC) 10 MG tablet Take 1 tablet (10 mg total) by mouth daily. 90 tablet 3  . aspirin 81 MG tablet Take 81 mg by mouth daily.      Marland Kitchen atenolol-chlorthalidone (TENORETIC) 100-25 MG tablet Take 1 tablet by mouth daily. 90 tablet 3  . benazepril (LOTENSIN) 40 MG tablet Take 1 tablet (40 mg total) by mouth daily. 90 tablet 2  . clobetasol cream (TEMOVATE) 9.56 % Apply 1 application topically 2 (two) times daily. 30 g 1  . famotidine (PEPCID) 20 MG tablet Take 1 tablet (20 mg total) by mouth 2 (two) times daily. 180 tablet 3  . finasteride (PROSCAR) 5 MG tablet Take 1 tablet (5 mg total) by mouth daily. 90 tablet 3  . guaiFENesin (MUCINEX) 600 MG 12 hr tablet Take 1 tablet (600 mg total)  by mouth 2 (two) times daily as needed. 60 tablet 1  . KRILL OIL PO Take 500 mg by mouth daily at 6 (six) AM.    . meclizine (ANTIVERT) 12.5 MG tablet Take 1 tablet (12.5 mg total) by mouth 3 (three) times daily as needed for dizziness. 60 tablet 1  . Multiple Vitamins-Minerals (CENTRUM SILVER PO) Take by mouth daily at 6 (six) AM.    . MYRBETRIQ 50 MG TB24 tablet     . prednisoLONE acetate (PRED FORTE) 1 % ophthalmic suspension     . traZODone (DESYREL) 50 MG tablet TAKE 1/2 TO 1 TABLET AT BEDTIME AS NEEDED FOR SLEEP 90 tablet 1   No current facility-administered medications on file prior  to visit.   Review of Systems All otherwise neg per pt     Objective:   Physical Exam BP (!) 132/82 (BP Location: Left Arm, Patient Position: Sitting, Cuff Size: Large)   Pulse 68   Temp 98.7 F (37.1 C) (Oral)   Ht 5\' 7"  (1.702 m)   Wt (!) 247 lb (112 kg)   SpO2 96%   BMI 38.69 kg/m  VS noted,  Constitutional: Pt appears in NAD HENT: Head: NCAT.  Right Ear: External ear normal.  Left Ear: External ear normal.  Eyes: . Pupils are equal, round, and reactive to light. Conjunctivae and EOM are normal Nose: without d/c or deformity Neck: Neck supple. Gross normal ROM Cardiovascular: Normal rate and regular rhythm.   Pulmonary/Chest: Effort normal and breath sounds without rales or wheezing.  Abd:  Soft, NT, ND, + BS, no organomegaly Neurological: Pt is alert. At baseline orientation, motor grossly intact Skin: Skin is warm. No rashes, other new lesions, no LE edema Psychiatric: Pt behavior is normal without agitation  All otherwise neg per pt Lab Results  Component Value Date   WBC 9.1 10/15/2019   HGB 16.5 10/15/2019   HCT 47.3 10/15/2019   PLT 217 10/15/2019   GLUCOSE 125 (H) 10/15/2019   CHOL 179 10/15/2019   TRIG 231 (H) 10/15/2019   HDL 35 (L) 10/15/2019   LDLDIRECT 106.0 08/05/2016   LDLCALC 109 (H) 10/15/2019   ALT 20 10/15/2019   AST 18 10/15/2019   NA 138 10/15/2019   K 3.4 (L) 10/15/2019   CL 100 10/15/2019   CREATININE 0.90 10/15/2019   BUN 15 10/15/2019   CO2 30 10/15/2019   TSH 1.55 10/15/2019   PSA 1.1 10/15/2019   HGBA1C 5.4 10/15/2019   MICROALBUR 4.6 (H) 12/09/2015      Assessment & Plan:

## 2019-10-23 NOTE — Patient Instructions (Addendum)
Please take OTC Vitamin D3 at 2000 units per day, indefinitely.  Ok to increase the simvastatin to 40 mg per day (for cholesterol)  Ok to increase the potassium pills to 3 per day (for low potassium)  Please finish your antibiotic, and make sure to follow up with urology as you do  Please continue all other medications as before, and refills have been done if requested.  Please have the pharmacy call with any other refills you may need.  Please continue your efforts at being more active, low cholesterol diet, and weight control.  You are otherwise up to date with prevention measures today.  Please keep your appointments with your specialists as you may have planned  Please make an Appointment to return in 6 months, or sooner if needed, also with Lab Appointment for testing done 3-5 days before at the [facility] (so this is for TWO appointments - please see the scheduling desk as you leave)

## 2019-10-29 ENCOUNTER — Encounter: Payer: Self-pay | Admitting: Internal Medicine

## 2019-10-29 NOTE — Assessment & Plan Note (Signed)
stable overall by history and exam, recent data reviewed with pt, and pt to continue medical treatment as before,  to f/u any worsening symptoms or concerns  

## 2019-10-29 NOTE — Assessment & Plan Note (Signed)
[place] for 3 kdor qd

## 2019-10-29 NOTE — Assessment & Plan Note (Addendum)
For increased simvastatin 40 qd, o/w stable overall by history and exam, recent data reviewed with pt, and pt to continue medical treatment as before,  to f/u any worsening symptoms or concerns

## 2019-10-29 NOTE — Assessment & Plan Note (Signed)

## 2019-10-29 NOTE — Assessment & Plan Note (Signed)
To finish antibx 

## 2019-10-29 NOTE — Assessment & Plan Note (Signed)
Cont oral replacement 

## 2019-11-19 DIAGNOSIS — H52222 Regular astigmatism, left eye: Secondary | ICD-10-CM | POA: Diagnosis not present

## 2019-11-19 DIAGNOSIS — H5202 Hypermetropia, left eye: Secondary | ICD-10-CM | POA: Diagnosis not present

## 2019-11-19 DIAGNOSIS — Z961 Presence of intraocular lens: Secondary | ICD-10-CM | POA: Diagnosis not present

## 2019-11-19 DIAGNOSIS — H2512 Age-related nuclear cataract, left eye: Secondary | ICD-10-CM | POA: Diagnosis not present

## 2019-11-20 DIAGNOSIS — H2511 Age-related nuclear cataract, right eye: Secondary | ICD-10-CM | POA: Diagnosis not present

## 2019-12-07 ENCOUNTER — Other Ambulatory Visit: Payer: Self-pay | Admitting: Internal Medicine

## 2019-12-10 DIAGNOSIS — H5202 Hypermetropia, left eye: Secondary | ICD-10-CM | POA: Diagnosis not present

## 2019-12-10 DIAGNOSIS — Z961 Presence of intraocular lens: Secondary | ICD-10-CM | POA: Diagnosis not present

## 2019-12-10 DIAGNOSIS — H2511 Age-related nuclear cataract, right eye: Secondary | ICD-10-CM | POA: Diagnosis not present

## 2019-12-10 DIAGNOSIS — H524 Presbyopia: Secondary | ICD-10-CM | POA: Diagnosis not present

## 2019-12-10 DIAGNOSIS — H52223 Regular astigmatism, bilateral: Secondary | ICD-10-CM | POA: Diagnosis not present

## 2020-02-11 DIAGNOSIS — B078 Other viral warts: Secondary | ICD-10-CM | POA: Diagnosis not present

## 2020-02-11 DIAGNOSIS — L821 Other seborrheic keratosis: Secondary | ICD-10-CM | POA: Diagnosis not present

## 2020-02-11 DIAGNOSIS — L820 Inflamed seborrheic keratosis: Secondary | ICD-10-CM | POA: Diagnosis not present

## 2020-02-11 DIAGNOSIS — D2262 Melanocytic nevi of left upper limb, including shoulder: Secondary | ICD-10-CM | POA: Diagnosis not present

## 2020-02-11 DIAGNOSIS — D485 Neoplasm of uncertain behavior of skin: Secondary | ICD-10-CM | POA: Diagnosis not present

## 2020-02-14 ENCOUNTER — Other Ambulatory Visit: Payer: Self-pay | Admitting: Internal Medicine

## 2020-02-14 NOTE — Telephone Encounter (Signed)
Please refill as per office routine med refill policy (all routine meds refilled for 3 mo or monthly per pt preference up to one year from last visit, then month to month grace period for 3 mo, then further med refills will have to be denied)  

## 2020-02-18 ENCOUNTER — Encounter: Payer: Self-pay | Admitting: Orthopedic Surgery

## 2020-02-18 ENCOUNTER — Ambulatory Visit: Payer: Medicare PPO | Admitting: Orthopedic Surgery

## 2020-02-18 VITALS — Ht 67.0 in | Wt 247.0 lb

## 2020-02-18 DIAGNOSIS — M170 Bilateral primary osteoarthritis of knee: Secondary | ICD-10-CM

## 2020-02-19 ENCOUNTER — Encounter: Payer: Self-pay | Admitting: Orthopedic Surgery

## 2020-02-19 DIAGNOSIS — M170 Bilateral primary osteoarthritis of knee: Secondary | ICD-10-CM | POA: Diagnosis not present

## 2020-02-19 NOTE — Progress Notes (Signed)
Office Visit Note   Patient: Joel King           Date of Birth: February 11, 1951           MRN: [id number] Visit Date: 02/18/2020              Requested by: Biagio Borg, MD [place],  [place] [id number] PCP: Biagio Borg, MD  Chief Complaint  Patient presents with  . Right Knee - Follow-up    S/p cortisone injections 07/30/2019   . Left Knee - Follow-up      HPI: Patient is a 69 year old gentleman who presents in follow-up for osteoarthritis both knees he states the last injection about 6 months ago provided good relief.  Assessment & Plan: Visit Diagnoses:  1. Bilateral primary osteoarthritis of knee     Plan: Both knees were injected he tolerated this well.  Follow-Up Instructions: Return if symptoms worsen or fail to improve.   Ortho Exam  Patient is alert, oriented, no adenopathy, well-dressed, normal affect, normal respiratory effort. Examination patient has an antalgic gait he has venous swelling of both legs with tenderness to palpation of the medial and lateral joint line of both knees there is a mild effusion of both knees collaterals and cruciates are stable.  No redness no cellulitis no signs of infection.  Imaging: No results found. No images are attached to the encounter.  Labs: Lab Results  Component Value Date   HGBA1C 5.4 10/15/2019   HGBA1C 5.7 10/09/2018   HGBA1C 5.6 08/12/2017     Lab Results  Component Value Date   ALBUMIN 4.6 10/09/2018   ALBUMIN 4.7 08/12/2017   ALBUMIN 4.7 08/05/2016    No results found for: MG Lab Results  Component Value Date   VD25OH 21 (L) 10/15/2019    No results found for: PREALBUMIN CBC EXTENDED Latest Ref Rng & Units 10/15/2019 10/09/2018 08/12/2017  WBC 3.8 - 10.8 Thousand/uL 9.1 9.7 11.2(H)  RBC 4.20 - 5.80 Million/uL 5.07 5.10 5.36  HGB 13.2 - 17.1 g/dL 16.5 16.4 17.3(H)  HCT 38 - 50 % 47.3 47.4 49.2  PLT 140 - 400 Thousand/uL 217 208.0 219.0  NEUTROABS 1,500 - 7,800 cells/uL 6,871  7.2 8.7(H)  LYMPHSABS 850 - 3,900 cells/uL 983 1.3 1.2     Body mass index is 38.69 kg/m.  Orders:  No orders of the defined types were placed in this encounter.  No orders of the defined types were placed in this encounter.    Procedures: Large Joint Inj: bilateral knee on 02/19/2020 3:04 PM Indications: pain and diagnostic evaluation Details: 22 G 1.5 in needle, anteromedial approach  Arthrogram: No  Outcome: tolerated well, no immediate complications Procedure, treatment alternatives, risks and benefits explained, specific risks discussed. Consent was given by the patient. Immediately prior to procedure a time out was called to verify the correct patient, procedure, equipment, support staff and site/side marked as required. Patient was prepped and draped in the usual sterile fashion.      Clinical Data: No additional findings.  ROS:  All other systems negative, except as noted in the HPI. Review of Systems  Objective: Vital Signs: Ht 5\' 7"  (1.702 m)   Wt 247 lb (112 kg)   BMI 38.69 kg/m   Specialty Comments:  No specialty comments available.  PMFS History: Patient Active Problem List   Diagnosis Date Noted  . Vitamin D deficiency 10/23/2019  . UTI (urinary tract infection) 10/23/2019  . Vertigo 02/06/2019  .  Cough 03/15/2018  . BPH (benign prostatic hyperplasia) 08/13/2017  . Effusion, right knee 12/13/2016  . Bilateral primary osteoarthritis of knee 08/24/2016  . Plantar fasciitis 03/16/2016  . Achilles tendinitis, right leg 03/16/2016  . Rash 01/15/2016  . Stasis dermatitis 01/15/2016  . Chronic venous insufficiency 12/30/2015  . Cellulitis 12/30/2015  . Itching 12/09/2015  . Dizziness 03/25/2015  . Skin lesion of face 03/25/2015  . Abnormal liver function 03/25/2015  . Hypokalemia 03/25/2015  . Acute upper respiratory infection 01/14/2015  . Neuritis of right lower extremity 08/20/2014  . DVT (deep venous thrombosis) ([place]) 02/08/2014  . Right  knee pain 12/22/2013  . Fatigue 11/02/2010  . Preventative health care 11/01/2010  . DEPRESSION 05/31/2008  . Diarrhea 05/31/2008  . SKIN LESION 12/08/2007  . Pre-diabetes 07/22/2007  . PEPTIC ULCER DISEASE 07/22/2007  . INSOMNIA-SLEEP DISORDER-UNSPEC 07/21/2007  . Hyperlipidemia 11/21/2006  . Anxiety state 11/21/2006  . OBESITY, MILD 11/17/2006  . Essential hypertension 11/17/2006   Past Medical History:  Diagnosis Date  . ANXIETY 11/21/2006  . Cancer ([place])    skin cancer left cheek  . DEPRESSION 05/31/2008  . DIABETES MELLITUS, TYPE II 07/22/2007   no meds  . Diarrhea 05/31/2008  . DVT (deep venous thrombosis) ([place]) 02/08/2014  . HYPERLIPIDEMIA 11/21/2006  . HYPERTENSION 11/17/2006  . INSOMNIA-SLEEP DISORDER-UNSPEC 07/21/2007  . OBESITY, MILD 11/17/2006  . PEPTIC ULCER DISEASE 07/22/2007  . PEPTIC ULCER DISEASE, HX OF 11/17/2006  . Post-operative nausea and vomiting    past 2 surgeries  . SINUSITIS- ACUTE-NOS 12/08/2007  . SKIN LESION 12/08/2007    Family History  Problem Relation Age of Onset  . Heart disease Mother   . Stroke Other   . Cancer Other        prostate  . Heart disease Father   . COPD Brother     Past Surgical History:  Procedure Laterality Date  . inguinal herniorrhapy     bilat  . s/p incarcerated recurrent ventral hernia    . s/p meckel's diverticulum     Social History   Occupational History  . Not on file  Tobacco Use  . Smoking status: Never Smoker  . Smokeless tobacco: Never Used  Vaping Use  . Vaping Use: Never used  Substance and Sexual Activity  . Alcohol use: No  . Drug use: No  . Sexual activity: Not on file

## 2020-04-03 DIAGNOSIS — D485 Neoplasm of uncertain behavior of skin: Secondary | ICD-10-CM | POA: Diagnosis not present

## 2020-04-03 DIAGNOSIS — L820 Inflamed seborrheic keratosis: Secondary | ICD-10-CM | POA: Diagnosis not present

## 2020-04-03 DIAGNOSIS — X32XXXD Exposure to sunlight, subsequent encounter: Secondary | ICD-10-CM | POA: Diagnosis not present

## 2020-04-03 DIAGNOSIS — L570 Actinic keratosis: Secondary | ICD-10-CM | POA: Diagnosis not present

## 2020-04-22 ENCOUNTER — Other Ambulatory Visit (INDEPENDENT_AMBULATORY_CARE_PROVIDER_SITE_OTHER): Payer: Medicare PPO

## 2020-04-22 ENCOUNTER — Other Ambulatory Visit: Payer: Self-pay

## 2020-04-22 DIAGNOSIS — E785 Hyperlipidemia, unspecified: Secondary | ICD-10-CM

## 2020-04-22 DIAGNOSIS — R7303 Prediabetes: Secondary | ICD-10-CM

## 2020-04-22 DIAGNOSIS — E876 Hypokalemia: Secondary | ICD-10-CM

## 2020-04-22 LAB — MAGNESIUM: Magnesium: 1.8 mg/dL (ref 1.5–2.5)

## 2020-04-22 LAB — COMPREHENSIVE METABOLIC PANEL
ALT: 20 U/L (ref 0–53)
AST: 19 U/L (ref 0–37)
Albumin: 4.6 g/dL (ref 3.5–5.2)
Alkaline Phosphatase: 66 U/L (ref 39–117)
BUN: 23 mg/dL (ref 6–23)
CO2: 28 mEq/L (ref 19–32)
Calcium: 9.5 mg/dL (ref 8.4–10.5)
Chloride: 103 mEq/L (ref 96–112)
Creatinine, Ser: 0.87 mg/dL (ref 0.40–1.50)
GFR: 87.83 mL/min (ref >60.00)
Glucose, Bld: 98 mg/dL (ref 70–99)
Potassium: 3.4 mEq/L — ABNORMAL LOW (ref 3.5–5.1)
Sodium: 139 mEq/L (ref 135–145)
Total Bilirubin: 0.8 mg/dL (ref 0.2–1.2)
Total Protein: 7.4 g/dL (ref 6.0–8.3)

## 2020-04-22 LAB — LIPID PANEL
Cholesterol: 129 mg/dL (ref 0–200)
HDL: 34.3 mg/dL — ABNORMAL LOW (ref >39.00)
LDL Cholesterol: 72 mg/dL (ref 0–99)
NonHDL: 94.55
Total CHOL/HDL Ratio: 4
Triglycerides: 114 mg/dL (ref 0.0–149.0)
VLDL: 22.8 mg/dL (ref 0.0–40.0)

## 2020-04-22 LAB — HEMOGLOBIN A1C: Hgb A1c MFr Bld: 5.7 % (ref 4.6–6.5)

## 2020-04-24 ENCOUNTER — Encounter: Payer: Self-pay | Admitting: Podiatry

## 2020-04-24 ENCOUNTER — Ambulatory Visit: Payer: Medicare PPO | Admitting: Podiatry

## 2020-04-24 ENCOUNTER — Ambulatory Visit: Payer: Medicare PPO | Admitting: Internal Medicine

## 2020-04-24 ENCOUNTER — Ambulatory Visit (INDEPENDENT_AMBULATORY_CARE_PROVIDER_SITE_OTHER): Payer: Medicare PPO

## 2020-04-24 ENCOUNTER — Other Ambulatory Visit: Payer: Self-pay

## 2020-04-24 ENCOUNTER — Other Ambulatory Visit: Payer: Self-pay | Admitting: Internal Medicine

## 2020-04-24 DIAGNOSIS — M778 Other enthesopathies, not elsewhere classified: Secondary | ICD-10-CM

## 2020-04-24 DIAGNOSIS — R52 Pain, unspecified: Secondary | ICD-10-CM

## 2020-04-24 DIAGNOSIS — L600 Ingrowing nail: Secondary | ICD-10-CM | POA: Diagnosis not present

## 2020-04-24 DIAGNOSIS — M779 Enthesopathy, unspecified: Secondary | ICD-10-CM | POA: Diagnosis not present

## 2020-04-24 NOTE — Telephone Encounter (Signed)
Please refill as per office routine med refill policy (all routine meds refilled for 3 mo or monthly per pt preference up to one year from last visit, then month to month grace period for 3 mo, then further med refills will have to be denied)  

## 2020-04-24 NOTE — Progress Notes (Signed)
Subjective:   Patient ID: Joel King, male   DOB: 70 y.o.   MRN: [id number]   HPI Patient presents stating that he has had pain around his left big toe that is not sure about and also has on his right big toenail and ingrown medial side with there is a small spicule after having had previous surgery years ago and states that he is not sure as to the pain left but it is quite sore to walk on.  Patient does not smoke likes to be active   Review of Systems  All other systems reviewed and are negative.       Objective:  Physical Exam Vitals and nursing note reviewed.  Constitutional:      Appearance: He is well-developed and well-nourished.  Cardiovascular:     Pulses: Intact distal pulses.  Pulmonary:     Effort: Pulmonary effort is normal.  Musculoskeletal:        General: Normal range of motion.  Skin:    General: Skin is warm.  Neurological:     Mental Status: He is alert.     Neurovascular status intact muscle strength was found to be adequate with patient found to have fluid buildup around the second metatarsal phalangeal joint left and also a spicule in the right hallux medial side that is painful when pressed to a mild degree.  It is more aggravating when he wear shoes.  Patient has good digit perfusion well oriented x3     Assessment:  Inflammatory capsulitis of the second MPJ left along with a some nail spicule right hallux bothersome     Plan:  H&P conditions reviewed and reviewed x-ray the left.  I did do a proximal nerve block graft aspirated the second MPJ getting out a small clear fluid injected quarter cc dexamethasone Kenalog and applied padding and for the right I went ahead and I recommended correction of the nail and explained procedure to patient and risk and I infiltrated the right hallux 60 mg like Marcaine mixture sterile prep done and using sterile instrumentation removed the medial spicule exposed the matrix applied phenol 3 applications 30 seconds  followed by alcohol lavage sterile dressing.  Patient will be seen back to recheck  X-rays indicate that there is no signs of fracture or any bony injury

## 2020-04-24 NOTE — Patient Instructions (Signed)

## 2020-04-28 ENCOUNTER — Ambulatory Visit: Payer: Medicare PPO | Admitting: Internal Medicine

## 2020-05-01 ENCOUNTER — Ambulatory Visit: Payer: Medicare PPO | Admitting: Internal Medicine

## 2020-05-01 ENCOUNTER — Other Ambulatory Visit: Payer: Self-pay

## 2020-05-01 VITALS — BP 122/70 | HR 61 | Temp 98.6°F | Ht 67.0 in | Wt 236.0 lb

## 2020-05-01 DIAGNOSIS — Z0000 Encounter for general adult medical examination without abnormal findings: Secondary | ICD-10-CM

## 2020-05-01 DIAGNOSIS — I10 Essential (primary) hypertension: Secondary | ICD-10-CM | POA: Diagnosis not present

## 2020-05-01 DIAGNOSIS — E7849 Other hyperlipidemia: Secondary | ICD-10-CM

## 2020-05-01 DIAGNOSIS — R7303 Prediabetes: Secondary | ICD-10-CM | POA: Diagnosis not present

## 2020-05-01 DIAGNOSIS — E559 Vitamin D deficiency, unspecified: Secondary | ICD-10-CM | POA: Diagnosis not present

## 2020-05-01 NOTE — Progress Notes (Signed)
Established Patient Office Visit  Subjective:  Patient ID: JAEDEN SCHNITTKER, male    DOB: [birth year]  Age: 70 y.o. MRN: [id number]       Chief Complaint: follow up HTN, HLD and hyperglycemia and low vit D       HPI:  ELIUT GILDAY is a 70 y.o. male here overall doing well, has no specific complaints.  Due for flu shot.  Pt denies chest pain, increased sob or doe, wheezing, orthopnea, PND, increased LE swelling, palpitations, dizziness or syncope.  Pt denies new neurological symptoms such as new headache, or facial or extremity weakness or numbness   Pt denies polydipsia, polyuria,  Pt denies fever, night sweats, loss of appetite, or other constitutional symptoms.       Wt has been coming down with better diet.  Wt Readings from Last 3 Encounters:  05/01/20 236 lb (107 kg)  02/18/20 247 lb (112 kg)  10/23/19 (!) 247 lb (112 kg)   BP Readings from Last 3 Encounters:  05/01/20 122/70  10/23/19 (!) 132/82  02/06/19 124/86         Past Medical History:  Diagnosis Date  . ANXIETY 11/21/2006  . Cancer ([place])    skin cancer left cheek  . DEPRESSION 05/31/2008  . DIABETES MELLITUS, TYPE II 07/22/2007   no meds  . Diarrhea 05/31/2008  . DVT (deep venous thrombosis) ([place]) 02/08/2014  . HYPERLIPIDEMIA 11/21/2006  . HYPERTENSION 11/17/2006  . INSOMNIA-SLEEP DISORDER-UNSPEC 07/21/2007  . OBESITY, MILD 11/17/2006  . PEPTIC ULCER DISEASE 07/22/2007  . PEPTIC ULCER DISEASE, HX OF 11/17/2006  . Post-operative nausea and vomiting    past 2 surgeries  . SINUSITIS- ACUTE-NOS 12/08/2007  . SKIN LESION 12/08/2007   Past Surgical History:  Procedure Laterality Date  . inguinal herniorrhapy     bilat  . s/p incarcerated recurrent ventral hernia    . s/p meckel's diverticulum      reports that he has never smoked. He has never used smokeless tobacco. He reports that he does not drink alcohol and does not use drugs. family history includes COPD in his brother; Cancer in an other family member; Heart  disease in his father and mother; Stroke in an other family member. Allergies  Allergen Reactions  . Atorvastatin     Unsure of results  . Pravastatin Sodium     Unsure of reaction  . Rosuvastatin     Unsure of reaction   Current Outpatient Medications on File Prior to Visit  Medication Sig Dispense Refill  . amLODipine (NORVASC) 10 MG tablet TAKE 1 TABLET EVERY DAY 90 tablet 3  . aspirin 81 MG tablet Take 81 mg by mouth daily.    Marland Kitchen atenolol-chlorthalidone (TENORETIC) 100-25 MG tablet TAKE 1 TABLET EVERY DAY 90 tablet 1  . benazepril (LOTENSIN) 40 MG tablet TAKE 1 TABLET EVERY DAY 90 tablet 1  . famotidine (PEPCID) 20 MG tablet TAKE 1 TABLET TWICE DAILY 180 tablet 3  . finasteride (PROSCAR) 5 MG tablet Take 1 tablet (5 mg total) by mouth daily. 90 tablet 3  . Multiple Vitamins-Minerals (CENTRUM SILVER PO) Take by mouth daily at 6 (six) AM.    . potassium chloride (KLOR-CON 10) 10 MEQ tablet Take 3 tablets (30 mEq total) by mouth daily. 270 tablet 3  . simvastatin (ZOCOR) 40 MG tablet Take 1 tablet (40 mg total) by mouth at bedtime. 90 tablet 3  . BESIVANCE 0.6 % SUSP Place 1 drop into the left eye 3 (three)  times daily. (Patient not taking: Reported on 05/01/2020)    . clobetasol cream (TEMOVATE) 8.29 % Apply 1 application topically 2 (two) times daily. (Patient not taking: Reported on 05/01/2020) 30 g 1  . DUREZOL 0.05 % EMUL  (Patient not taking: Reported on 05/01/2020)    . guaiFENesin (MUCINEX) 600 MG 12 hr tablet Take 1 tablet (600 mg total) by mouth 2 (two) times daily as needed. (Patient not taking: Reported on 05/01/2020) 60 tablet 1  . ketorolac (ACULAR) 0.5 % ophthalmic solution  (Patient not taking: Reported on 05/01/2020)    . KRILL OIL PO Take 500 mg by mouth daily at 6 (six) AM. (Patient not taking: Reported on 05/01/2020)    . MYRBETRIQ 50 MG TB24 tablet     . prednisoLONE acetate (PRED FORTE) 1 % ophthalmic suspension  (Patient not taking: Reported on 05/01/2020)    . PROLENSA 0.07 %  SOLN SMARTSIG:1 Drop(s) Left Eye Every Evening (Patient not taking: Reported on 05/01/2020)    . traZODone (DESYREL) 50 MG tablet TAKE 1/2 TO 1 TABLET AT BEDTIME AS NEEDED FOR SLEEP (Patient not taking: Reported on 05/01/2020) 90 tablet 1   No current facility-administered medications on file prior to visit.        ROS:  All others reviewed and negative.  Objective        PE:  BP 122/70   Pulse 61   Temp 98.6 F (37 C) (Oral)   Ht 5\' 7"  (1.702 m)   Wt 236 lb (107 kg)   SpO2 97%   BMI 36.96 kg/m                 Constitutional: Pt appears in NAD               HENT: Head: NCAT.                Right Ear: External ear normal.                 Left Ear: External ear normal.                Eyes: . Pupils are equal, round, and reactive to light. Conjunctivae and EOM are normal               Nose: without d/c or deformity               Neck: Neck supple. Gross normal ROM               Cardiovascular: Normal rate and regular rhythm.                 Pulmonary/Chest: Effort normal and breath sounds without rales or wheezing.                Abd:  Soft, NT, ND, + BS, no organomegaly               Neurological: Pt is alert. At baseline orientation, motor grossly intact               Skin: Skin is warm. No rashes, no other new lesions, LE edema - none               Psychiatric: Pt behavior is normal without agitation   Micro: none  Cardiac tracings I have personally interpreted today:  none  Pertinent Radiological findings (summarize): none   Lab Results  Component Value Date   WBC 9.1 10/15/2019   HGB 16.5 10/15/2019  HCT 47.3 10/15/2019   PLT 217 10/15/2019   GLUCOSE 98 04/22/2020   CHOL 129 04/22/2020   TRIG 114.0 04/22/2020   HDL 34.30 (L) 04/22/2020   LDLDIRECT 106.0 08/05/2016   LDLCALC 72 04/22/2020   ALT 20 04/22/2020   AST 19 04/22/2020   NA 139 04/22/2020   K 3.4 (L) 04/22/2020   CL 103 04/22/2020   CREATININE 0.87 04/22/2020   BUN 23 04/22/2020   CO2 28 04/22/2020    TSH 1.55 10/15/2019   PSA 1.1 10/15/2019   HGBA1C 5.7 04/22/2020   MICROALBUR 4.6 (H) 12/09/2015   Assessment/Plan:  OLIVANDER LANGHAM is a 70 y.o. White or Caucasian [1] male with  has a past medical history of ANXIETY (11/21/2006), Cancer [facility]), DEPRESSION (05/31/2008), DIABETES MELLITUS, TYPE II (07/22/2007), Diarrhea (05/31/2008), DVT (deep venous thrombosis) ([place]) (02/08/2014), HYPERLIPIDEMIA (11/21/2006), HYPERTENSION (11/17/2006), INSOMNIA-SLEEP DISORDER-UNSPEC (07/21/2007), OBESITY, MILD (11/17/2006), PEPTIC ULCER DISEASE (07/22/2007), PEPTIC ULCER DISEASE, HX OF (11/17/2006), Post-operative nausea and vomiting, SINUSITIS- ACUTE-NOS (12/08/2007), and SKIN LESION (12/08/2007).  Essential hypertension BP Readings from Last 3 Encounters:  05/01/20 122/70  10/23/19 (!) 132/82  02/06/19 124/86   Stable, pt to continue medical treatment - amlodipine, tenoretic, lotensin   Hyperlipidemia Lab Results  Component Value Date   LDLCALC 72 04/22/2020   Stable, pt to continue current statin zocor   Current Outpatient Medications (Cardiovascular):  .  amLODipine (NORVASC) 10 MG tablet, TAKE 1 TABLET EVERY DAY .  atenolol-chlorthalidone (TENORETIC) 100-25 MG tablet, TAKE 1 TABLET EVERY DAY .  benazepril (LOTENSIN) 40 MG tablet, TAKE 1 TABLET EVERY DAY .  simvastatin (ZOCOR) 40 MG tablet, Take 1 tablet (40 mg total) by mouth at bedtime.  Current Outpatient Medications (Respiratory):  .  guaiFENesin (MUCINEX) 600 MG 12 hr tablet, Take 1 tablet (600 mg total) by mouth 2 (two) times daily as needed. (Patient not taking: Reported on 05/01/2020)  Current Outpatient Medications (Analgesics):  .  aspirin 81 MG tablet, Take 81 mg by mouth daily.   Current Outpatient Medications (Other):  .  famotidine (PEPCID) 20 MG tablet, TAKE 1 TABLET TWICE DAILY .  finasteride (PROSCAR) 5 MG tablet, Take 1 tablet (5 mg total) by mouth daily. .  Multiple Vitamins-Minerals (CENTRUM SILVER PO), Take by mouth daily at 6  (six) AM. .  potassium chloride (KLOR-CON 10) 10 MEQ tablet, Take 3 tablets (30 mEq total) by mouth daily. Marland Kitchen  BESIVANCE 0.6 % SUSP, Place 1 drop into the left eye 3 (three) times daily. (Patient not taking: Reported on 05/01/2020) .  clobetasol cream (TEMOVATE) [id number] %, Apply 1 application topically 2 (two) times daily. (Patient not taking: Reported on 05/01/2020) .  DUREZOL 0.05 % EMUL,  .  ketorolac (ACULAR) 0.5 % ophthalmic solution,  .  KRILL OIL PO, Take 500 mg by mouth daily at 6 (six) AM. (Patient not taking: Reported on 05/01/2020) .  MYRBETRIQ 50 MG TB24 tablet,  .  prednisoLONE acetate (PRED FORTE) 1 % ophthalmic suspension,  .  PROLENSA 0.07 % SOLN, SMARTSIG:1 Drop(s) Left Eye Every Evening (Patient not taking: Reported on 05/01/2020) .  traZODone (DESYREL) 50 MG tablet, TAKE 1/2 TO 1 TABLET AT BEDTIME AS NEEDED FOR SLEEP (Patient not taking: Reported on 05/01/2020)   Pre-diabetes Lab Results  Component Value Date   HGBA1C 5.7 04/22/2020   Stable, pt to continue current medical treatment  - diet  Vitamin D deficiency Last vitamin D Lab Results  Component Value Date   VD25OH 21 (L) 10/15/2019  Low at last check,  Has been tolerating oral replacement, for f/u lab next visit   Followup: Return in about 6 months (around 10/29/2020).  Cathlean Cower, MD 05/04/2020 2:49 AM [facility]

## 2020-05-01 NOTE — Patient Instructions (Signed)
You had the flu shot today  Please continue all other medications as before, and refills have been done if requested.  Please have the pharmacy call with any other refills you may need.  Please continue your efforts at being more active, low cholesterol diet, and weight control..  Please keep your appointments with your specialists as you may have planned  Please make an Appointment to return in 6 months, or sooner if needed, also with Lab Appointment for testing done 3-5 days before at the [facility] (so this is for TWO appointments - please see the scheduling desk as you leave)  Due to the ongoing Covid 19 pandemic, our lab now requires an appointment for any labs done at our office.  If you need labs done and do not have an appointment, please call our office ahead of time to schedule before presenting to the lab for your testing.

## 2020-05-04 ENCOUNTER — Encounter: Payer: Self-pay | Admitting: Internal Medicine

## 2020-05-04 NOTE — Assessment & Plan Note (Signed)
Lab Results  Component Value Date   HGBA1C 5.7 04/22/2020   Stable, pt to continue current medical treatment  - diet

## 2020-05-04 NOTE — Assessment & Plan Note (Signed)
BP Readings from Last 3 Encounters:  05/01/20 122/70  10/23/19 (!) 132/82  02/06/19 124/86   Stable, pt to continue medical treatment - amlodipine, tenoretic, lotensin

## 2020-05-04 NOTE — Assessment & Plan Note (Signed)
Last vitamin D Lab Results  Component Value Date   VD25OH 21 (L) 10/15/2019   Low at last check,  Has been tolerating oral replacement, for f/u lab next visit

## 2020-05-04 NOTE — Assessment & Plan Note (Signed)
Lab Results  Component Value Date   LDLCALC 72 04/22/2020   Stable, pt to continue current statin zocor   Current Outpatient Medications (Cardiovascular):  .  amLODipine (NORVASC) 10 MG tablet, TAKE 1 TABLET EVERY DAY .  atenolol-chlorthalidone (TENORETIC) 100-25 MG tablet, TAKE 1 TABLET EVERY DAY .  benazepril (LOTENSIN) 40 MG tablet, TAKE 1 TABLET EVERY DAY .  simvastatin (ZOCOR) 40 MG tablet, Take 1 tablet (40 mg total) by mouth at bedtime.  Current Outpatient Medications (Respiratory):  .  guaiFENesin (MUCINEX) 600 MG 12 hr tablet, Take 1 tablet (600 mg total) by mouth 2 (two) times daily as needed. (Patient not taking: Reported on 05/01/2020)  Current Outpatient Medications (Analgesics):  .  aspirin 81 MG tablet, Take 81 mg by mouth daily.   Current Outpatient Medications (Other):  .  famotidine (PEPCID) 20 MG tablet, TAKE 1 TABLET TWICE DAILY .  finasteride (PROSCAR) 5 MG tablet, Take 1 tablet (5 mg total) by mouth daily. .  Multiple Vitamins-Minerals (CENTRUM SILVER PO), Take by mouth daily at 6 (six) AM. .  potassium chloride (KLOR-CON 10) 10 MEQ tablet, Take 3 tablets (30 mEq total) by mouth daily. Marland Kitchen  BESIVANCE 0.6 % SUSP, Place 1 drop into the left eye 3 (three) times daily. (Patient not taking: Reported on 05/01/2020) .  clobetasol cream (TEMOVATE) 5.68 %, Apply 1 application topically 2 (two) times daily. (Patient not taking: Reported on 05/01/2020) .  DUREZOL 0.05 % EMUL,  .  ketorolac (ACULAR) 0.5 % ophthalmic solution,  .  KRILL OIL PO, Take 500 mg by mouth daily at 6 (six) AM. (Patient not taking: Reported on 05/01/2020) .  MYRBETRIQ 50 MG TB24 tablet,  .  prednisoLONE acetate (PRED FORTE) 1 % ophthalmic suspension,  .  PROLENSA 0.07 % SOLN, SMARTSIG:1 Drop(s) Left Eye Every Evening (Patient not taking: Reported on 05/01/2020) .  traZODone (DESYREL) 50 MG tablet, TAKE 1/2 TO 1 TABLET AT BEDTIME AS NEEDED FOR SLEEP (Patient not taking: Reported on 05/01/2020)

## 2020-05-13 ENCOUNTER — Other Ambulatory Visit: Payer: Self-pay | Admitting: Podiatry

## 2020-05-13 DIAGNOSIS — M778 Other enthesopathies, not elsewhere classified: Secondary | ICD-10-CM

## 2020-05-21 ENCOUNTER — Other Ambulatory Visit: Payer: Self-pay

## 2020-05-21 MED ORDER — FINASTERIDE 5 MG PO TABS: 5.0000 mg | ORAL_TABLET | Freq: Every day | ORAL | 3 refills | Status: DC

## 2020-06-19 ENCOUNTER — Encounter: Payer: Self-pay | Admitting: Orthopedic Surgery

## 2020-06-19 ENCOUNTER — Ambulatory Visit: Payer: Medicare PPO | Admitting: Orthopedic Surgery

## 2020-06-19 DIAGNOSIS — M170 Bilateral primary osteoarthritis of knee: Secondary | ICD-10-CM

## 2020-06-20 ENCOUNTER — Encounter: Payer: Self-pay | Admitting: Orthopedic Surgery

## 2020-06-20 DIAGNOSIS — M170 Bilateral primary osteoarthritis of knee: Secondary | ICD-10-CM | POA: Diagnosis not present

## 2020-06-20 NOTE — Progress Notes (Signed)
Office Visit Note   Patient: Joel King           Date of Birth: July 11, 1950           MRN: [id number] Visit Date: 06/19/2020              Requested by: Biagio Borg, MD [place],  [place] [address] PCP: Biagio Borg, MD  Chief Complaint  Patient presents with  . Right Knee - Pain  . Left Knee - Pain      HPI: Patient is a 70 year old gentleman with osteoarthritis bilateral knees he has had good relief with previous steroid injections.  His last injection was November of last year.  Patient states he has had recurrent symptoms at this time.  Assessment & Plan: Visit Diagnoses:  1. Bilateral primary osteoarthritis of knee     Plan: Both knees were injected he tolerated this well.  Follow-Up Instructions: Return if symptoms worsen or fail to improve.   Ortho Exam  Patient is alert, oriented, no adenopathy, well-dressed, normal affect, normal respiratory effort. Examination patient has an antalgic gait.  He has crepitation with range of motion of both knees and tender to palpation over the medial and lateral joint line as well as the patellofemoral joint collaterals are cruciates are stable.  There is a mild effusion.  No cellulitis.  Imaging: No results found. No images are attached to the encounter.  Labs: Lab Results  Component Value Date   HGBA1C 5.7 04/22/2020   HGBA1C 5.4 10/15/2019   HGBA1C 5.7 10/09/2018     Lab Results  Component Value Date   ALBUMIN 4.6 04/22/2020   ALBUMIN 4.6 10/09/2018   ALBUMIN 4.7 08/12/2017    Lab Results  Component Value Date   MG 1.8 04/22/2020   Lab Results  Component Value Date   VD25OH 21 (L) 10/15/2019    No results found for: PREALBUMIN CBC EXTENDED Latest Ref Rng & Units 10/15/2019 10/09/2018 08/12/2017  WBC 3.8 - 10.8 Thousand/uL 9.1 9.7 11.2(H)  RBC 4.20 - 5.80 Million/uL 5.07 5.10 5.36  HGB 13.2 - 17.1 g/dL 16.5 16.4 17.3(H)  HCT 38.5 - 50.0 % 47.3 47.4 49.2  PLT 140 - 400 Thousand/uL  217 208.0 219.0  NEUTROABS 1,500 - 7,800 cells/uL 6,871 7.2 8.7(H)  LYMPHSABS 850 - 3,900 cells/uL 983 1.3 1.2     There is no height or weight on file to calculate BMI.  Orders:  No orders of the defined types were placed in this encounter.  No orders of the defined types were placed in this encounter.    Procedures: Large Joint Inj: bilateral knee on 06/20/2020 5:03 PM Indications: pain and diagnostic evaluation Details: 22 G 1.5 in needle, anteromedial approach  Arthrogram: No  Outcome: tolerated well, no immediate complications Procedure, treatment alternatives, risks and benefits explained, specific risks discussed. Consent was given by the patient. Immediately prior to procedure a time out was called to verify the correct patient, procedure, equipment, support staff and site/side marked as required. Patient was prepped and draped in the usual sterile fashion.      Clinical Data: No additional findings.  ROS:  All other systems negative, except as noted in the HPI. Review of Systems  Objective: Vital Signs: There were no vitals taken for this visit.  Specialty Comments:  No specialty comments available.  PMFS History: Patient Active Problem List   Diagnosis Date Noted  . Vitamin D deficiency 10/23/2019  . UTI (urinary  tract infection) 10/23/2019  . Vertigo 02/06/2019  . Cough 03/15/2018  . BPH (benign prostatic hyperplasia) 08/13/2017  . Effusion, right knee 12/13/2016  . Bilateral primary osteoarthritis of knee 08/24/2016  . Plantar fasciitis 03/16/2016  . Achilles tendinitis, right leg 03/16/2016  . Rash 01/15/2016  . Stasis dermatitis 01/15/2016  . Chronic venous insufficiency 12/30/2015  . Cellulitis 12/30/2015  . Itching 12/09/2015  . Dizziness 03/25/2015  . Skin lesion of face 03/25/2015  . Abnormal liver function 03/25/2015  . Hypokalemia 03/25/2015  . Acute upper respiratory infection 01/14/2015  . Neuritis of right lower extremity 08/20/2014   . DVT (deep venous thrombosis) ([place]) 02/08/2014  . Right knee pain 12/22/2013  . Fatigue 11/02/2010  . Preventative health care 11/01/2010  . DEPRESSION 05/31/2008  . Diarrhea 05/31/2008  . SKIN LESION 12/08/2007  . Pre-diabetes 07/22/2007  . PEPTIC ULCER DISEASE 07/22/2007  . INSOMNIA-SLEEP DISORDER-UNSPEC 07/21/2007  . Hyperlipidemia 11/21/2006  . Anxiety state 11/21/2006  . OBESITY, MILD 11/17/2006  . Essential hypertension 11/17/2006   Past Medical History:  Diagnosis Date  . ANXIETY 11/21/2006  . Cancer ([place])    skin cancer left cheek  . DEPRESSION 05/31/2008  . DIABETES MELLITUS, TYPE II 07/22/2007   no meds  . Diarrhea 05/31/2008  . DVT (deep venous thrombosis) ([place]) 02/08/2014  . HYPERLIPIDEMIA 11/21/2006  . HYPERTENSION 11/17/2006  . INSOMNIA-SLEEP DISORDER-UNSPEC 07/21/2007  . OBESITY, MILD 11/17/2006  . PEPTIC ULCER DISEASE 07/22/2007  . PEPTIC ULCER DISEASE, HX OF 11/17/2006  . Post-operative nausea and vomiting    past 2 surgeries  . SINUSITIS- ACUTE-NOS 12/08/2007  . SKIN LESION 12/08/2007    Family History  Problem Relation Age of Onset  . Heart disease Mother   . Stroke Other   . Cancer Other        prostate  . Heart disease Father   . COPD Brother     Past Surgical History:  Procedure Laterality Date  . inguinal herniorrhapy     bilat  . s/p incarcerated recurrent ventral hernia    . s/p meckel's diverticulum     Social History   Occupational History  . Not on file  Tobacco Use  . Smoking status: Never Smoker  . Smokeless tobacco: Never Used  Vaping Use  . Vaping Use: Never used  Substance and Sexual Activity  . Alcohol use: No  . Drug use: No  . Sexual activity: Not on file

## 2020-07-14 ENCOUNTER — Telehealth: Payer: Self-pay | Admitting: Internal Medicine

## 2020-07-14 NOTE — Telephone Encounter (Signed)
   Patient calling to report cough and cold x 1 week Patient requesting medication be called to pharmacy  Scheduler asked patient covid screening  questions. Patient states he does not have covid test and not been tested and patient disconnected call

## 2020-07-15 ENCOUNTER — Telehealth (INDEPENDENT_AMBULATORY_CARE_PROVIDER_SITE_OTHER): Payer: Medicare PPO | Admitting: Internal Medicine

## 2020-07-15 ENCOUNTER — Encounter: Payer: Self-pay | Admitting: Internal Medicine

## 2020-07-15 DIAGNOSIS — R062 Wheezing: Secondary | ICD-10-CM | POA: Diagnosis not present

## 2020-07-15 DIAGNOSIS — F411 Generalized anxiety disorder: Secondary | ICD-10-CM | POA: Diagnosis not present

## 2020-07-15 DIAGNOSIS — R059 Cough, unspecified: Secondary | ICD-10-CM

## 2020-07-15 MED ORDER — LEVOFLOXACIN 500 MG PO TABS: 500.0000 mg | ORAL_TABLET | Freq: Every day | ORAL | 0 refills | Status: AC

## 2020-07-15 MED ORDER — HYDROCODONE-HOMATROPINE 5-1.5 MG/5ML PO SYRP: 5.0000 mL | ORAL_SOLUTION | Freq: Four times a day (QID) | ORAL | 0 refills | Status: AC | PRN

## 2020-07-15 MED ORDER — PREDNISONE 10 MG PO TABS: ORAL_TABLET | ORAL | 0 refills | Status: DC

## 2020-07-15 NOTE — Telephone Encounter (Signed)
Pt has appt at 1pm - will see then

## 2020-07-15 NOTE — Assessment & Plan Note (Signed)
Overall stable,  No need for change in tx at this time,  to f/u any worsening symptoms or concerns

## 2020-07-15 NOTE — Assessment & Plan Note (Signed)
Mild to mod, for predpac asd  to f/u any worsening symptoms or concerns 

## 2020-07-15 NOTE — Assessment & Plan Note (Signed)
Mild to mod, c/w bronchitis vs pna, for antibx course, predpac, check home covid testing,  to f/u any worsening symptoms or concerns

## 2020-07-15 NOTE — Progress Notes (Signed)
Patient ID: Joel King, male   DOB: 09-17-1950, 70 y.o.   MRN: [id number]  Virtual Visit via Video Note  I connected with Sonnie Alamo on 07/15/20 at  1:00 PM EDT by a video enabled telemedicine application and verified that I am speaking with the correct person using two identifiers.  Location of all partipants today Patient: at home Provider: at office   I discussed the limitations of evaluation and management by telemedicine and the availability of in person appointments. The patient expressed understanding and agreed to proceed.  History of Present Illness: Here with acute onset mild to mod 2-3 days ST, HA, general weakness and malaise, with prod cough greenish sputum, but Pt denies chest pain, increased sob or doe, wheezing, orthopnea, PND, increased LE swelling, palpitations, dizziness or syncope, except for onset mild wheezing last pm with sob.   Pt denies polydipsia, polyuria,  Pt denies recent unintentional wt loss, night sweats, loss of appetite, or other constitutional symptoms.  Denies worsening depressive symptoms, suicidal ideation, or panic Past Medical History:  Diagnosis Date  . ANXIETY 11/21/2006  . Cancer ([place])    skin cancer left cheek  . DEPRESSION 05/31/2008  . DIABETES MELLITUS, TYPE II 07/22/2007   no meds  . Diarrhea 05/31/2008  . DVT (deep venous thrombosis) ([place]) 02/08/2014  . HYPERLIPIDEMIA 11/21/2006  . HYPERTENSION 11/17/2006  . INSOMNIA-SLEEP DISORDER-UNSPEC 07/21/2007  . OBESITY, MILD 11/17/2006  . PEPTIC ULCER DISEASE 07/22/2007  . PEPTIC ULCER DISEASE, HX OF 11/17/2006  . Post-operative nausea and vomiting    past 2 surgeries  . SINUSITIS- ACUTE-NOS 12/08/2007  . SKIN LESION 12/08/2007   Past Surgical History:  Procedure Laterality Date  . inguinal herniorrhapy     bilat  . s/p incarcerated recurrent ventral hernia    . s/p meckel's diverticulum      reports that he has never smoked. He has never used smokeless tobacco. He reports that he  does not drink alcohol and does not use drugs. family history includes COPD in his brother; Cancer in an other family member; Heart disease in his father and mother; Stroke in an other family member. Allergies  Allergen Reactions  . Atorvastatin     Unsure of results  . Pravastatin Sodium     Unsure of reaction  . Rosuvastatin     Unsure of reaction   Current Outpatient Medications on File Prior to Visit  Medication Sig Dispense Refill  . amLODipine (NORVASC) 10 MG tablet TAKE 1 TABLET EVERY DAY 90 tablet 3  . aspirin 81 MG tablet Take 81 mg by mouth daily.    Marland Kitchen atenolol-chlorthalidone (TENORETIC) 100-25 MG tablet TAKE 1 TABLET EVERY DAY 90 tablet 1  . benazepril (LOTENSIN) 40 MG tablet TAKE 1 TABLET EVERY DAY 90 tablet 1  . BESIVANCE 0.6 % SUSP Place 1 drop into the left eye 3 (three) times daily. (Patient not taking: Reported on 05/01/2020)    . clobetasol cream (TEMOVATE) 5.88 % Apply 1 application topically 2 (two) times daily. (Patient not taking: Reported on 05/01/2020) 30 g 1  . DUREZOL 0.05 % EMUL  (Patient not taking: Reported on 05/01/2020)    . famotidine (PEPCID) 20 MG tablet TAKE 1 TABLET TWICE DAILY 180 tablet 3  . finasteride (PROSCAR) 5 MG tablet Take 1 tablet (5 mg total) by mouth daily. 90 tablet 3  . guaiFENesin (MUCINEX) 600 MG 12 hr tablet Take 1 tablet (600 mg total) by mouth 2 (two) times daily as needed. (Patient  not taking: Reported on 05/01/2020) 60 tablet 1  . ketorolac (ACULAR) 0.5 % ophthalmic solution  (Patient not taking: Reported on 05/01/2020)    . KRILL OIL PO Take 500 mg by mouth daily at 6 (six) AM. (Patient not taking: Reported on 05/01/2020)    . Multiple Vitamins-Minerals (CENTRUM SILVER PO) Take by mouth daily at 6 (six) AM.    . MYRBETRIQ 50 MG TB24 tablet     . potassium chloride (KLOR-CON 10) 10 MEQ tablet Take 3 tablets (30 mEq total) by mouth daily. 270 tablet 3  . prednisoLONE acetate (PRED FORTE) 1 % ophthalmic suspension  (Patient not taking: Reported  on 05/01/2020)    . PROLENSA 0.07 % SOLN SMARTSIG:1 Drop(s) Left Eye Every Evening (Patient not taking: Reported on 05/01/2020)    . simvastatin (ZOCOR) 40 MG tablet Take 1 tablet (40 mg total) by mouth at bedtime. 90 tablet 3  . traZODone (DESYREL) 50 MG tablet TAKE 1/2 TO 1 TABLET AT BEDTIME AS NEEDED FOR SLEEP (Patient not taking: Reported on 05/01/2020) 90 tablet 1   No current facility-administered medications on file prior to visit.    Observations/Objective: Alert, NAD, appropriate mood and affect, resps normal, cn 2-12 intact, moves all 4s, no visible rash or swelling Lab Results  Component Value Date   WBC 9.1 10/15/2019   HGB 16.5 10/15/2019   HCT 47.3 10/15/2019   PLT 217 10/15/2019   GLUCOSE 98 04/22/2020   CHOL 129 04/22/2020   TRIG 114.0 04/22/2020   HDL 34.30 (L) 04/22/2020   LDLDIRECT 106.0 08/05/2016   LDLCALC 72 04/22/2020   ALT 20 04/22/2020   AST 19 04/22/2020   NA 139 04/22/2020   K 3.4 (L) 04/22/2020   CL 103 04/22/2020   CREATININE 0.87 04/22/2020   BUN 23 04/22/2020   CO2 28 04/22/2020   TSH 1.55 10/15/2019   PSA 1.1 10/15/2019   HGBA1C 5.7 04/22/2020   MICROALBUR 4.6 (H) 12/09/2015   Assessment and Plan: See notes  Follow Up Instructions: See notes   I discussed the assessment and treatment plan with the patient. The patient was provided an opportunity to ask questions and all were answered. The patient agreed with the plan and demonstrated an understanding of the instructions.   The patient was advised to call back or seek an in-person evaluation if the symptoms worsen or if the condition fails to improve as anticipated.  Cathlean Cower, MD

## 2020-07-15 NOTE — Patient Instructions (Signed)
Please take all new medication as prescribed 

## 2020-08-27 ENCOUNTER — Telehealth: Payer: Self-pay

## 2020-08-27 NOTE — Telephone Encounter (Signed)
Pt in office requesting work note.  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  He states the manager is requesting a letter stating that he is unable to do this.  He would like the letter addressed to [facility].    Last OV 05/01/20.  He states he will come back to pick up letter when the letter is available.  Please leave detailed message if he does not answer is mobile.

## 2020-08-28 NOTE — Telephone Encounter (Signed)
Very sorry, I dont feel comfortable with a letter for this, as he has no specific medical reason to avoid heat in the first place, and I can not write a letter stating he "might" get heat exhaustion.    Please drink plenty of fluids, wear long sleeve light colored clothing, and hat while outdoors, or consider another job if he feels he is not fit for this

## 2020-08-28 NOTE — Telephone Encounter (Signed)
Left detailed message explaining Dr Gwynn Burly response; pt to call if additional ques/concerns.

## 2020-08-30 ENCOUNTER — Telehealth: Payer: Self-pay | Admitting: Internal Medicine

## 2020-08-30 NOTE — Telephone Encounter (Signed)
Please refill as per office routine med refill policy (all routine meds refilled for 3 mo or monthly per pt preference up to one year from last visit, then month to month grace period for 3 mo, then further med refills will have to be denied)  

## 2020-09-09 ENCOUNTER — Other Ambulatory Visit: Payer: Self-pay

## 2020-09-09 ENCOUNTER — Ambulatory Visit (INDEPENDENT_AMBULATORY_CARE_PROVIDER_SITE_OTHER): Payer: Medicare PPO | Admitting: Orthopedic Surgery

## 2020-09-09 ENCOUNTER — Other Ambulatory Visit: Payer: Self-pay | Admitting: Internal Medicine

## 2020-09-09 DIAGNOSIS — M170 Bilateral primary osteoarthritis of knee: Secondary | ICD-10-CM

## 2020-09-09 MED ORDER — POTASSIUM CHLORIDE ER 10 MEQ PO TBCR: 30.0000 meq | EXTENDED_RELEASE_TABLET | Freq: Every day | ORAL | 3 refills | Status: DC

## 2020-09-09 NOTE — Telephone Encounter (Signed)
Patient called and said that he would like he coated potassium chloride pills sent to [place], [place]. Please advise

## 2020-09-09 NOTE — Addendum Note (Signed)
Addended by: Biagio Borg on: 09/09/2020 03:29 PM   Modules accepted: Orders

## 2020-09-19 ENCOUNTER — Encounter: Payer: Self-pay | Admitting: Orthopedic Surgery

## 2020-09-19 DIAGNOSIS — M170 Bilateral primary osteoarthritis of knee: Secondary | ICD-10-CM | POA: Diagnosis not present

## 2020-09-19 NOTE — Progress Notes (Signed)
Office Visit Note   Patient: Joel King           Date of Birth: November 21, 1950           MRN: [id number] Visit Date: 09/09/2020              Requested by: Biagio Borg, MD [address] [place],  [place] [address] PCP: Biagio Borg, MD  Chief Complaint  Patient presents with   Left Knee - Pain   Right Knee - Pain      HPI: Patient is a 70 year old gentleman with osteoarthritis both knees he has had temporary relief with previous injections presents at this time with persistent pain.  Assessment & Plan: Visit Diagnoses:  1. Bilateral primary osteoarthritis of knee     Plan: Both knees were injected he tolerated this well.  Follow-Up Instructions: Return if symptoms worsen or fail to improve.   Ortho Exam  Patient is alert, oriented, no adenopathy, well-dressed, normal affect, normal respiratory effort. Examination there is no effusion of either knee there is crepitation with range of motion collaterals are cruciates are stable he has tenderness to palpation over the medial and lateral joint line.  Imaging: No results found. No images are attached to the encounter.  Labs: Lab Results  Component Value Date   HGBA1C 5.7 04/22/2020   HGBA1C 5.4 10/15/2019   HGBA1C 5.7 10/09/2018     Lab Results  Component Value Date   ALBUMIN 4.6 04/22/2020   ALBUMIN 4.6 10/09/2018   ALBUMIN 4.7 08/12/2017    Lab Results  Component Value Date   MG 1.8 04/22/2020   Lab Results  Component Value Date   VD25OH 21 (L) 10/15/2019    No results found for: PREALBUMIN CBC EXTENDED Latest Ref Rng & Units 10/15/2019 10/09/2018 08/12/2017  WBC 3.8 - 10.8 Thousand/uL 9.1 9.7 11.2(H)  RBC 4.20 - 5.80 Million/uL 5.07 5.10 5.36  HGB 13.2 - 17.1 g/dL 16.5 16.4 17.3(H)  HCT 38.5 - 50.0 % 47.3 47.4 49.2  PLT 140 - 400 Thousand/uL 217 208.0 219.0  NEUTROABS 1,500 - 7,800 cells/uL 6,871 7.2 8.7(H)  LYMPHSABS 850 - 3,900 cells/uL 983 1.3 1.2     There is no height or weight on  file to calculate BMI.  Orders:  No orders of the defined types were placed in this encounter.  No orders of the defined types were placed in this encounter.    Procedures: Large Joint Inj: bilateral knee on 09/19/2020 2:41 PM Indications: pain and diagnostic evaluation Details: 22 G 1.5 in needle, anteromedial approach  Arthrogram: No  Outcome: tolerated well, no immediate complications Procedure, treatment alternatives, risks and benefits explained, specific risks discussed. Consent was given by the patient. Immediately prior to procedure a time out was called to verify the correct patient, procedure, equipment, support staff and site/side marked as required. Patient was prepped and draped in the usual sterile fashion.     Clinical Data: No additional findings.  ROS:  All other systems negative, except as noted in the HPI. Review of Systems  Objective: Vital Signs: There were no vitals taken for this visit.  Specialty Comments:  No specialty comments available.  PMFS History: Patient Active Problem List   Diagnosis Date Noted   Wheezing 07/15/2020   Vitamin D deficiency 10/23/2019   UTI (urinary tract infection) 10/23/2019   Vertigo 02/06/2019   Cough 03/15/2018   BPH (benign prostatic hyperplasia) 08/13/2017   Effusion, right knee 12/13/2016   Bilateral  primary osteoarthritis of knee 08/24/2016   Plantar fasciitis 03/16/2016   Achilles tendinitis, right leg 03/16/2016   Rash 01/15/2016   Stasis dermatitis 01/15/2016   Chronic venous insufficiency 12/30/2015   Cellulitis 12/30/2015   Itching 12/09/2015   Dizziness 03/25/2015   Skin lesion of face 03/25/2015   Abnormal liver function 03/25/2015   Hypokalemia 03/25/2015   Acute upper respiratory infection 01/14/2015   Neuritis of right lower extremity 08/20/2014   DVT (deep venous thrombosis) ([place]) 02/08/2014   Right knee pain 12/22/2013   Fatigue 11/02/2010   Preventative health care 11/01/2010    DEPRESSION 05/31/2008   Diarrhea 05/31/2008   SKIN LESION 12/08/2007   Pre-diabetes 07/22/2007   PEPTIC ULCER DISEASE 07/22/2007   INSOMNIA-SLEEP DISORDER-UNSPEC 07/21/2007   Hyperlipidemia 11/21/2006   Anxiety state 11/21/2006   OBESITY, MILD 11/17/2006   Essential hypertension 11/17/2006   Past Medical History:  Diagnosis Date   ANXIETY 11/21/2006   Cancer ([place])    skin cancer left cheek   DEPRESSION 05/31/2008   DIABETES MELLITUS, TYPE II 07/22/2007   no meds   Diarrhea 05/31/2008   DVT (deep venous thrombosis) ([place]) 02/08/2014   HYPERLIPIDEMIA 11/21/2006   HYPERTENSION 11/17/2006   INSOMNIA-SLEEP DISORDER-UNSPEC 07/21/2007   OBESITY, MILD 11/17/2006   PEPTIC ULCER DISEASE 07/22/2007   PEPTIC ULCER DISEASE, HX OF 11/17/2006   Post-operative nausea and vomiting    past 2 surgeries   SINUSITIS- ACUTE-NOS 12/08/2007   SKIN LESION 12/08/2007    Family History  Problem Relation Age of Onset   Heart disease Mother    Stroke Other    Cancer Other        prostate   Heart disease Father    COPD Brother     Past Surgical History:  Procedure Laterality Date   inguinal herniorrhapy     bilat   s/p incarcerated recurrent ventral hernia     s/p meckel's diverticulum     Social History   Occupational History   Not on file  Tobacco Use   Smoking status: Never   Smokeless tobacco: Never  Vaping Use   Vaping Use: Never used  Substance and Sexual Activity   Alcohol use: No   Drug use: No   Sexual activity: Not on file

## 2020-09-25 ENCOUNTER — Other Ambulatory Visit: Payer: Self-pay

## 2020-09-25 ENCOUNTER — Ambulatory Visit (INDEPENDENT_AMBULATORY_CARE_PROVIDER_SITE_OTHER): Payer: Medicare PPO

## 2020-09-25 ENCOUNTER — Ambulatory Visit: Payer: Medicare PPO | Admitting: Podiatry

## 2020-09-25 ENCOUNTER — Encounter: Payer: Self-pay | Admitting: Podiatry

## 2020-09-25 DIAGNOSIS — M7672 Peroneal tendinitis, left leg: Secondary | ICD-10-CM | POA: Diagnosis not present

## 2020-09-25 DIAGNOSIS — M79672 Pain in left foot: Secondary | ICD-10-CM | POA: Diagnosis not present

## 2020-09-25 MED ORDER — TRIAMCINOLONE ACETONIDE 10 MG/ML IJ SUSP
10.0000 mg | Freq: Once | INTRAMUSCULAR | Status: AC
  Administered 2020-09-25: 10 mg

## 2020-09-25 NOTE — Progress Notes (Signed)
Subjective:   Patient ID: Joel King, male   DOB: 70 y.o.   MRN: [id number]   HPI Patient presents with a lot of pain in the side of his left foot states been very tender and it started all of a sudden 3 days ago that the other area is doing very well   ROS      Objective:  Physical Exam  Neurovascular status intact with patient's left peroneal insertion base of fifth metatarsal being very inflamed red and sore     Assessment:  Acute peroneal tendinitis left inflammation fluid at insertion     Plan:  H&P reviewed condition sterile prep injected the insertion 3 mg dexamethasone Kenalog 5 mg Xylocaine advised on ice therapy and reduced activity reappoint as needed  X-rays were negative for signs of a fracture associated with this appears to be soft tissue

## 2020-10-17 DIAGNOSIS — N401 Enlarged prostate with lower urinary tract symptoms: Secondary | ICD-10-CM | POA: Diagnosis not present

## 2020-10-17 DIAGNOSIS — R351 Nocturia: Secondary | ICD-10-CM | POA: Diagnosis not present

## 2020-10-24 ENCOUNTER — Encounter: Payer: Medicare PPO | Admitting: Internal Medicine

## 2020-10-26 ENCOUNTER — Other Ambulatory Visit: Payer: Self-pay | Admitting: Internal Medicine

## 2020-10-26 NOTE — Telephone Encounter (Signed)
Please refill as per office routine med refill policy (all routine meds refilled for 3 mo or monthly per pt preference up to one year from last visit, then month to month grace period for 3 mo, then further med refills will have to be denied)  

## 2020-10-27 ENCOUNTER — Other Ambulatory Visit (INDEPENDENT_AMBULATORY_CARE_PROVIDER_SITE_OTHER): Payer: Medicare PPO

## 2020-10-27 DIAGNOSIS — E559 Vitamin D deficiency, unspecified: Secondary | ICD-10-CM | POA: Diagnosis not present

## 2020-10-27 DIAGNOSIS — E7849 Other hyperlipidemia: Secondary | ICD-10-CM | POA: Diagnosis not present

## 2020-10-27 DIAGNOSIS — Z0000 Encounter for general adult medical examination without abnormal findings: Secondary | ICD-10-CM | POA: Diagnosis not present

## 2020-10-27 DIAGNOSIS — E876 Hypokalemia: Secondary | ICD-10-CM

## 2020-10-27 DIAGNOSIS — R7303 Prediabetes: Secondary | ICD-10-CM | POA: Diagnosis not present

## 2020-10-27 LAB — CBC WITH DIFFERENTIAL/PLATELET
Basophils Absolute: 0 10*3/uL (ref 0.0–0.1)
Basophils Relative: 0.3 % (ref 0.0–3.0)
Eosinophils Absolute: 0.2 10*3/uL (ref 0.0–0.7)
Eosinophils Relative: 2.2 % (ref 0.0–5.0)
HCT: 43.8 % (ref 39.0–52.0)
Hemoglobin: 15.4 g/dL (ref 13.0–17.0)
Lymphocytes Relative: 10.8 % — ABNORMAL LOW (ref 12.0–46.0)
Lymphs Abs: 0.9 10*3/uL (ref 0.7–4.0)
MCHC: 35.2 g/dL (ref 30.0–36.0)
MCV: 92.3 fl (ref 78.0–100.0)
Monocytes Absolute: 0.7 10*3/uL (ref 0.1–1.0)
Monocytes Relative: 8.8 % (ref 3.0–12.0)
Neutro Abs: 6.2 10*3/uL (ref 1.4–7.7)
Neutrophils Relative %: 77.9 % — ABNORMAL HIGH (ref 43.0–77.0)
Platelets: 196 10*3/uL (ref 150.0–400.0)
RBC: 4.74 Mil/uL (ref 4.22–5.81)
RDW: 13.2 % (ref 11.5–15.5)
WBC: 8 10*3/uL (ref 4.0–10.5)

## 2020-10-27 LAB — LIPID PANEL
Cholesterol: 120 mg/dL (ref 0–200)
HDL: 31.5 mg/dL — ABNORMAL LOW (ref >39.00)
LDL Cholesterol: 55 mg/dL (ref 0–99)
NonHDL: 88.15
Total CHOL/HDL Ratio: 4
Triglycerides: 166 mg/dL — ABNORMAL HIGH (ref 0.0–149.0)
VLDL: 33.2 mg/dL (ref 0.0–40.0)

## 2020-10-27 LAB — URINALYSIS, ROUTINE W REFLEX MICROSCOPIC
Bilirubin Urine: NEGATIVE
Hgb urine dipstick: NEGATIVE
Ketones, ur: 15 — AB
Leukocytes,Ua: NEGATIVE
Nitrite: NEGATIVE
RBC / HPF: NONE SEEN (ref 0–?)
Specific Gravity, Urine: 1.02 (ref 1.000–1.030)
Urine Glucose: NEGATIVE
Urobilinogen, UA: 1 (ref 0.0–1.0)
pH: 6.5 (ref 5.0–8.0)

## 2020-10-27 LAB — HEPATIC FUNCTION PANEL
ALT: 13 U/L (ref 0–53)
AST: 17 U/L (ref 0–37)
Albumin: 4.3 g/dL (ref 3.5–5.2)
Alkaline Phosphatase: 61 U/L (ref 39–117)
Bilirubin, Direct: 0.1 mg/dL (ref 0.0–0.3)
Total Bilirubin: 0.6 mg/dL (ref 0.2–1.2)
Total Protein: 6.7 g/dL (ref 6.0–8.3)

## 2020-10-27 LAB — BASIC METABOLIC PANEL
BUN: 21 mg/dL (ref 6–23)
CO2: 27 mEq/L (ref 19–32)
Calcium: 9.3 mg/dL (ref 8.4–10.5)
Chloride: 103 mEq/L (ref 96–112)
Creatinine, Ser: 0.91 mg/dL (ref 0.40–1.50)
GFR: 85.48 mL/min (ref >60.00)
Glucose, Bld: 106 mg/dL — ABNORMAL HIGH (ref 70–99)
Potassium: 3.4 mEq/L — ABNORMAL LOW (ref 3.5–5.1)
Sodium: 140 mEq/L (ref 135–145)

## 2020-10-27 LAB — TSH: TSH: 1.45 u[IU]/mL (ref 0.35–5.50)

## 2020-10-27 LAB — MAGNESIUM: Magnesium: 1.9 mg/dL (ref 1.5–2.5)

## 2020-10-27 LAB — MICROALBUMIN / CREATININE URINE RATIO
Creatinine,U: 217.9 mg/dL
Microalb Creat Ratio: 3.7 mg/g (ref 0.0–30.0)
Microalb, Ur: 8.1 mg/dL — ABNORMAL HIGH (ref 0.0–1.9)

## 2020-10-27 LAB — PSA: PSA: 1.24 ng/mL (ref 0.10–4.00)

## 2020-10-27 LAB — HEMOGLOBIN A1C: Hgb A1c MFr Bld: 5.6 % (ref 4.6–6.5)

## 2020-10-27 LAB — VITAMIN D 25 HYDROXY (VIT D DEFICIENCY, FRACTURES): VITD: 35.48 ng/mL (ref 30.00–100.00)

## 2020-10-27 NOTE — Addendum Note (Signed)
Addended by: Octavio Manns E on: 10/27/2020 12:23 PM   Modules accepted: Orders

## 2020-11-04 ENCOUNTER — Ambulatory Visit (INDEPENDENT_AMBULATORY_CARE_PROVIDER_SITE_OTHER): Payer: Medicare PPO | Admitting: Internal Medicine

## 2020-11-04 ENCOUNTER — Encounter: Payer: Self-pay | Admitting: Internal Medicine

## 2020-11-04 ENCOUNTER — Other Ambulatory Visit: Payer: Self-pay

## 2020-11-04 VITALS — BP 136/70 | HR 67 | Temp 98.6°F | Ht 67.0 in | Wt 235.0 lb

## 2020-11-04 DIAGNOSIS — I10 Essential (primary) hypertension: Secondary | ICD-10-CM

## 2020-11-04 DIAGNOSIS — Z0001 Encounter for general adult medical examination with abnormal findings: Secondary | ICD-10-CM | POA: Diagnosis not present

## 2020-11-04 DIAGNOSIS — E876 Hypokalemia: Secondary | ICD-10-CM | POA: Diagnosis not present

## 2020-11-04 DIAGNOSIS — R7303 Prediabetes: Secondary | ICD-10-CM | POA: Diagnosis not present

## 2020-11-04 DIAGNOSIS — E559 Vitamin D deficiency, unspecified: Secondary | ICD-10-CM

## 2020-11-04 DIAGNOSIS — E538 Deficiency of other specified B group vitamins: Secondary | ICD-10-CM | POA: Diagnosis not present

## 2020-11-04 DIAGNOSIS — E7849 Other hyperlipidemia: Secondary | ICD-10-CM | POA: Diagnosis not present

## 2020-11-04 NOTE — Patient Instructions (Signed)
Ok to decrease the potassium pills to 2 per day only  Please continue all other medications as before, and refills have been done if requested.  Please have the pharmacy call with any other refills you may need.  Please continue your efforts at being more active, low cholesterol diet, and weight control.  You are otherwise up to date with prevention measures today.  Please keep your appointments with your specialists as you may have planned  Please make an Appointment to return for your 1 year visit, or sooner if needed, with Lab testing by Appointment as well, to be done about 3-5 days before at the [facility] (so this is for TWO appointments - please see the scheduling desk as you leave)  Due to the ongoing Covid 19 pandemic, our lab now requires an appointment for any labs done at our office.  If you need labs done and do not have an appointment, please call our office ahead of time to schedule before presenting to the lab for your testing.

## 2020-11-04 NOTE — Progress Notes (Signed)
Patient ID: Joel King, male   DOB: 10/09/1950, 70 y.o.   MRN: [id number]         Chief Complaint:: wellness exam and hypokalemia, vit d def, htn, hld       HPI:  Joel King is a 70 y.o. male here for wellness exam; declines covid booster, flu shot, shingrix but o/w up to date with preventive referrals and immunizations                        Also  Lost 12 lbs in the past yr intentionally with better diet and ore activity.  Has been compliant with potassium med but not better with increased dosing.  Pt denies chest pain, increased sob or doe, wheezing, orthopnea, PND, increased LE swelling, palpitations, dizziness or syncope.   Pt denies polydipsia, polyuria, or new focal neuro s/s.   Pt denies fever, wt loss, night sweats, loss of appetite, or other constitutional symptoms  No other new complaints  Not taking Vit D  Wt Readings from Last 3 Encounters:  11/04/20 235 lb (106.6 kg)  05/01/20 236 lb (107 kg)  02/18/20 247 lb (112 kg)   BP Readings from Last 3 Encounters:  11/04/20 136/70  05/01/20 122/70  10/23/19 (!) 132/82   Immunization History  Administered Date(s) Administered   DTP 09/05/1997   Fluad Quad(high Dose 65+) 11/11/2018   Influenza Split 12/20/2012   Influenza,inj,Quad PF,6+ Mos 11/29/2013   Influenza-Unspecified 11/20/2015, 11/28/2016, 11/26/2018   PFIZER(Purple Top)SARS-COV-2 Vaccination 04/22/2019, 05/14/2019, 12/31/2019   Pneumococcal Conjugate-13 09/27/2015   Pneumococcal Polysaccharide-23 03/30/1995, 07/29/2008, 12/09/2015, 11/28/2016   Td 07/29/2008   Tdap 11/21/2014   Zoster, Live 11/21/2014   There are no preventive care reminders to display for this patient.     Past Medical History:  Diagnosis Date   ANXIETY 11/21/2006   Cancer [facility])    skin cancer left cheek   DEPRESSION 05/31/2008   DIABETES MELLITUS, TYPE II 07/22/2007   no meds   Diarrhea 05/31/2008   DVT (deep venous thrombosis) ([place]) 02/08/2014   HYPERLIPIDEMIA 11/21/2006    HYPERTENSION 11/17/2006   INSOMNIA-SLEEP DISORDER-UNSPEC 07/21/2007   OBESITY, MILD 11/17/2006   PEPTIC ULCER DISEASE 07/22/2007   PEPTIC ULCER DISEASE, HX OF 11/17/2006   Post-operative nausea and vomiting    past 2 surgeries   SINUSITIS- ACUTE-NOS 12/08/2007   SKIN LESION 12/08/2007   Past Surgical History:  Procedure Laterality Date   inguinal herniorrhapy     bilat   s/p incarcerated recurrent ventral hernia     s/p meckel's diverticulum      reports that he has never smoked. He has never used smokeless tobacco. He reports that he does not drink alcohol and does not use drugs. family history includes COPD in his brother; Cancer in an other family member; Heart disease in his father and mother; Stroke in an other family member. Allergies  Allergen Reactions   Atorvastatin     Unsure of results   Pravastatin Sodium     Unsure of reaction   Rosuvastatin     Unsure of reaction   Current Outpatient Medications on File Prior to Visit  Medication Sig Dispense Refill   aspirin 81 MG tablet Take 81 mg by mouth daily.     Multiple Vitamins-Minerals (CENTRUM SILVER PO) Take by mouth daily at 6 (six) AM.     MYRBETRIQ 50 MG TB24 tablet      PROLENSA 0.07 % SOLN  No current facility-administered medications on file prior to visit.        ROS:  All others reviewed and negative.  Objective        PE:  BP 136/70 (BP Location: Left Arm, Patient Position: Sitting, Cuff Size: Large)   Pulse 67   Temp 98.6 F (37 C) (Oral)   Ht '5\' 7"'$  (1.702 m)   Wt 235 lb (106.6 kg)   SpO2 96%   BMI 36.81 kg/m                 Constitutional: Pt appears in NAD               HENT: Head: NCAT.                Right Ear: External ear normal.                 Left Ear: External ear normal.                Eyes: . Pupils are equal, round, and reactive to light. Conjunctivae and EOM are normal               Nose: without d/c or deformity               Neck: Neck supple. Gross normal ROM                Cardiovascular: Normal rate and regular rhythm.                 Pulmonary/Chest: Effort normal and breath sounds without rales or wheezing.                Abd:  Soft, NT, ND, + BS, no organomegaly               Neurological: Pt is alert. At baseline orientation, motor grossly intact               Skin: Skin is warm. No rashes, no other new lesions, LE edema - none               Psychiatric: Pt behavior is normal without agitation   Micro: none  Cardiac tracings I have personally interpreted today:  none  Pertinent Radiological findings (summarize): none   Lab Results  Component Value Date   WBC 8.0 10/27/2020   HGB 15.4 10/27/2020   HCT 43.8 10/27/2020   PLT 196.0 10/27/2020   GLUCOSE 106 (H) 10/27/2020   CHOL 120 10/27/2020   TRIG 166.0 (H) 10/27/2020   HDL 31.50 (L) 10/27/2020   LDLDIRECT 106.0 08/05/2016   LDLCALC 55 10/27/2020   ALT 13 10/27/2020   AST 17 10/27/2020   NA 140 10/27/2020   K 3.4 (L) 10/27/2020   CL 103 10/27/2020   CREATININE 0.91 10/27/2020   BUN 21 10/27/2020   CO2 27 10/27/2020   TSH 1.45 10/27/2020   PSA 1.24 10/27/2020   HGBA1C 5.6 10/27/2020   MICROALBUR 8.1 (H) 10/27/2020   Assessment/Plan:  Joel King is a 70 y.o. White or Caucasian [1] male with  has a past medical history of ANXIETY (11/21/2006), Cancer [facility]), DEPRESSION (05/31/2008), DIABETES MELLITUS, TYPE II (07/22/2007), Diarrhea (05/31/2008), DVT (deep venous thrombosis) ([place]) (02/08/2014), HYPERLIPIDEMIA (11/21/2006), HYPERTENSION (11/17/2006), INSOMNIA-SLEEP DISORDER-UNSPEC (07/21/2007), OBESITY, MILD (11/17/2006), PEPTIC ULCER DISEASE (07/22/2007), PEPTIC ULCER DISEASE, HX OF (11/17/2006), Post-operative nausea and vomiting, SINUSITIS- ACUTE-NOS (12/08/2007), and SKIN LESION (12/08/2007).  Encounter for well adult exam with abnormal findings Age  and sex appropriate education and counseling updated with regular exercise and diet Referrals for preventative services - none needed Immunizations  addressed - declines ovid booster, shingric, flu shot Smoking counseling  - none needed Evidence for depression or other mood disorder - none significant Most recent labs reviewed. I have personally reviewed and have noted: 1) the patient's medical and social history 2) The patient's current medications and supplements 3) The patient's height, weight, and BMI have been recorded in the chart   Vitamin D deficiency Last vitamin D Lab Results  Component Value Date   VD25OH 35.48 10/27/2020   Low normal , to start oral replacement   Pre-diabetes Lab Results  Component Value Date   HGBA1C 5.6 10/27/2020   Stable, pt to continue current medical treatment  - diet   Hypokalemia Mild uncontrolled, not improved with increased dosing, to check magnesium, and reduce potassium to 2 pill per day  Hyperlipidemia Lab Results  Component Value Date   LDLCALC 55 10/27/2020   Stable, pt to continue current statin  - cont zocor   Essential hypertension BP Readings from Last 3 Encounters:  11/04/20 136/70  05/01/20 122/70  10/23/19 (!) 132/82   Stable, pt to continue medical treatment norvasc, tenoretic, lisinopril  Followup: Return in about 1 year (around 11/04/2021).  Cathlean Cower, MD 11/07/2020 9:25 PM [facility]

## 2020-11-07 ENCOUNTER — Encounter: Payer: Self-pay | Admitting: Internal Medicine

## 2020-11-07 MED ORDER — ATENOLOL-CHLORTHALIDONE 100-25 MG PO TABS: 1.0000 | ORAL_TABLET | Freq: Every day | ORAL | 3 refills | Status: DC

## 2020-11-07 MED ORDER — POTASSIUM CHLORIDE ER 10 MEQ PO TBCR: 30.0000 meq | EXTENDED_RELEASE_TABLET | Freq: Every day | ORAL | 3 refills | Status: DC

## 2020-11-07 MED ORDER — AMLODIPINE BESYLATE 10 MG PO TABS: 10.0000 mg | ORAL_TABLET | Freq: Every day | ORAL | 3 refills | Status: DC

## 2020-11-07 MED ORDER — BENAZEPRIL HCL 40 MG PO TABS: 40.0000 mg | ORAL_TABLET | Freq: Every day | ORAL | 3 refills | Status: DC

## 2020-11-07 MED ORDER — FAMOTIDINE 20 MG PO TABS: 20.0000 mg | ORAL_TABLET | Freq: Two times a day (BID) | ORAL | 3 refills | Status: DC

## 2020-11-07 MED ORDER — SIMVASTATIN 40 MG PO TABS: 40.0000 mg | ORAL_TABLET | Freq: Every day | ORAL | 3 refills | Status: DC

## 2020-11-07 MED ORDER — FINASTERIDE 5 MG PO TABS: 5.0000 mg | ORAL_TABLET | Freq: Every day | ORAL | 3 refills | Status: DC

## 2020-11-07 NOTE — Assessment & Plan Note (Signed)
Age and sex appropriate education and counseling updated with regular exercise and diet Referrals for preventative services - none needed Immunizations addressed - declines ovid booster, shingric, flu shot Smoking counseling  - none needed Evidence for depression or other mood disorder - none significant Most recent labs reviewed. I have personally reviewed and have noted: 1) the patient's medical and social history 2) The patient's current medications and supplements 3) The patient's height, weight, and BMI have been recorded in the chart

## 2020-11-07 NOTE — Addendum Note (Signed)
Addended by: Biagio Borg on: 11/07/2020 09:28 PM   Modules accepted: Orders

## 2020-11-07 NOTE — Assessment & Plan Note (Signed)
BP Readings from Last 3 Encounters:  11/04/20 136/70  05/01/20 122/70  10/23/19 (!) 132/82   Stable, pt to continue medical treatment norvasc, tenoretic, lisinopril

## 2020-11-07 NOTE — Assessment & Plan Note (Signed)
Mild uncontrolled, not improved with increased dosing, to check magnesium, and reduce potassium to 2 pill per day

## 2020-11-07 NOTE — Assessment & Plan Note (Signed)
Lab Results  Component Value Date   HGBA1C 5.6 10/27/2020   Stable, pt to continue current medical treatment  - diet

## 2020-11-07 NOTE — Assessment & Plan Note (Signed)
Last vitamin D Lab Results  Component Value Date   VD25OH 35.48 10/27/2020   Low normal , to start oral replacement

## 2020-11-07 NOTE — Assessment & Plan Note (Signed)
Lab Results  Component Value Date   LDLCALC 55 10/27/2020   Stable, pt to continue current statin  - cont zocor

## 2020-11-10 ENCOUNTER — Encounter: Payer: Self-pay | Admitting: Orthopedic Surgery

## 2020-11-10 ENCOUNTER — Other Ambulatory Visit: Payer: Self-pay

## 2020-11-10 ENCOUNTER — Ambulatory Visit: Payer: Medicare PPO | Admitting: Orthopedic Surgery

## 2020-11-10 VITALS — Ht 67.0 in | Wt 235.0 lb

## 2020-11-10 DIAGNOSIS — M170 Bilateral primary osteoarthritis of knee: Secondary | ICD-10-CM

## 2020-11-11 ENCOUNTER — Encounter: Payer: Self-pay | Admitting: Orthopedic Surgery

## 2020-11-11 DIAGNOSIS — M170 Bilateral primary osteoarthritis of knee: Secondary | ICD-10-CM | POA: Diagnosis not present

## 2020-11-11 NOTE — Progress Notes (Signed)
Office Visit Note   Patient: Joel King           Date of Birth: [birth year]           MRN: [id number] Visit Date: 11/10/2020              Requested by: Biagio Borg, MD [address] [place],  [place] [address] PCP: Biagio Borg, MD  Chief Complaint  Patient presents with   Right Knee - Pain   Left Knee - Pain      HPI: Patient is a 70 year old gentleman who presents in follow-up for osteoarthritis bilateral knees.  Patient has had temporary relief with previous injections last injection was 2 months ago.  Patient states that he is lost approximately 12 pounds.  Assessment & Plan: Visit Diagnoses:  1. Bilateral primary osteoarthritis of knee     Plan: Both knees were injected he tolerated this well.  Recommended compression stockings for the venous insufficiency.  Follow-Up Instructions: Return if symptoms worsen or fail to improve.   Ortho Exam  Patient is alert, oriented, no adenopathy, well-dressed, normal affect, normal respiratory effort. Examination patient has pitting edema both lower extremities with brawny skin color changes edema there is no ulcers no drainage.  Patient has osteoarthritis both knees pain with weightbearing there is crepitation with range of motion patellofemoral joint collaterals and cruciates are stable there is minimal effusion.  Imaging: No results found. No images are attached to the encounter.  Labs: Lab Results  Component Value Date   HGBA1C 5.6 10/27/2020   HGBA1C 5.7 04/22/2020   HGBA1C 5.4 10/15/2019     Lab Results  Component Value Date   ALBUMIN 4.3 10/27/2020   ALBUMIN 4.6 04/22/2020   ALBUMIN 4.6 10/09/2018    Lab Results  Component Value Date   MG 1.9 10/27/2020   MG 1.8 04/22/2020   Lab Results  Component Value Date   VD25OH 35.48 10/27/2020   VD25OH 21 (L) 10/15/2019    No results found for: PREALBUMIN CBC EXTENDED Latest Ref Rng & Units 10/27/2020 10/15/2019 10/09/2018  WBC 4.0 - 10.5 K/uL 8.0 9.1  9.7  RBC 4.22 - 5.81 Mil/uL 4.74 5.07 5.10  HGB 13.0 - 17.0 g/dL 15.4 16.5 16.4  HCT 39.0 - 52.0 % 43.8 47.3 47.4  PLT 150.0 - 400.0 K/uL 196.0 217 208.0  NEUTROABS 1.4 - 7.7 K/uL 6.2 6,871 7.2  LYMPHSABS 0.7 - 4.0 K/uL 0.9 983 1.3     Body mass index is 36.81 kg/m.  Orders:  No orders of the defined types were placed in this encounter.  No orders of the defined types were placed in this encounter.    Procedures: Large Joint Inj: bilateral knee on 11/11/2020 11:45 AM Indications: pain and diagnostic evaluation Details: 22 G 1.5 in needle, anteromedial approach  Arthrogram: No  Outcome: tolerated well, no immediate complications Procedure, treatment alternatives, risks and benefits explained, specific risks discussed. Consent was given by the patient. Immediately prior to procedure a time out was called to verify the correct patient, procedure, equipment, support staff and site/side marked as required. Patient was prepped and draped in the usual sterile fashion.     Clinical Data: No additional findings.  ROS:  All other systems negative, except as noted in the HPI. Review of Systems  Objective: Vital Signs: Ht '5\' 7"'$  (1.702 m)   Wt 235 lb (106.6 kg)   BMI 36.81 kg/m   Specialty Comments:  No specialty comments available.  PMFS History: Patient Active Problem List   Diagnosis Date Noted   Wheezing 07/15/2020   Vitamin D deficiency 10/23/2019   UTI (urinary tract infection) 10/23/2019   Vertigo 02/06/2019   Cough 03/15/2018   BPH (benign prostatic hyperplasia) 08/13/2017   Effusion, right knee 12/13/2016   Bilateral primary osteoarthritis of knee 08/24/2016   Plantar fasciitis 03/16/2016   Achilles tendinitis, right leg 03/16/2016   Rash 01/15/2016   Stasis dermatitis 01/15/2016   Chronic venous insufficiency 12/30/2015   Cellulitis 12/30/2015   Itching 12/09/2015   Dizziness 03/25/2015   Skin lesion of face 03/25/2015   Abnormal liver function  03/25/2015   Hypokalemia 03/25/2015   Acute upper respiratory infection 01/14/2015   Neuritis of right lower extremity 08/20/2014   DVT (deep venous thrombosis) ([place]) 02/08/2014   Right knee pain 12/22/2013   Fatigue 11/02/2010   Encounter for well adult exam with abnormal findings 11/01/2010   DEPRESSION 05/31/2008   Diarrhea 05/31/2008   SKIN LESION 12/08/2007   Pre-diabetes 07/22/2007   PEPTIC ULCER DISEASE 07/22/2007   INSOMNIA-SLEEP DISORDER-UNSPEC 07/21/2007   Hyperlipidemia 11/21/2006   Anxiety state 11/21/2006   OBESITY, MILD 11/17/2006   Essential hypertension 11/17/2006   Past Medical History:  Diagnosis Date   ANXIETY 11/21/2006   Cancer ([place])    skin cancer left cheek   DEPRESSION 05/31/2008   DIABETES MELLITUS, TYPE II 07/22/2007   no meds   Diarrhea 05/31/2008   DVT (deep venous thrombosis) ([place]) 02/08/2014   HYPERLIPIDEMIA 11/21/2006   HYPERTENSION 11/17/2006   INSOMNIA-SLEEP DISORDER-UNSPEC 07/21/2007   OBESITY, MILD 11/17/2006   PEPTIC ULCER DISEASE 07/22/2007   PEPTIC ULCER DISEASE, HX OF 11/17/2006   Post-operative nausea and vomiting    past 2 surgeries   SINUSITIS- ACUTE-NOS 12/08/2007   SKIN LESION 12/08/2007    Family History  Problem Relation Age of Onset   Heart disease Mother    Stroke Other    Cancer Other        prostate   Heart disease Father    COPD Brother     Past Surgical History:  Procedure Laterality Date   inguinal herniorrhapy     bilat   s/p incarcerated recurrent ventral hernia     s/p meckel's diverticulum     Social History   Occupational History   Not on file  Tobacco Use   Smoking status: Never   Smokeless tobacco: Never  Vaping Use   Vaping Use: Never used  Substance and Sexual Activity   Alcohol use: No   Drug use: No   Sexual activity: Not on file

## 2020-11-27 ENCOUNTER — Telehealth: Payer: Self-pay | Admitting: Internal Medicine

## 2020-11-27 NOTE — Telephone Encounter (Signed)
I dont have an potassium chloride option to prescribe as a capsule

## 2020-11-27 NOTE — Telephone Encounter (Signed)
[employer] calling on behalf of patient to request  Potassium CAPSULES be ordered. Patient states he takes 2 daily.  He does not want tablet form. Requesting capsule  [employer] [phone] ext [phone]

## 2020-12-02 ENCOUNTER — Telehealth: Payer: Self-pay | Admitting: Internal Medicine

## 2020-12-02 NOTE — Telephone Encounter (Signed)
Pharmacy called in on behalf of patient  Patient states he is having trouble swallowing potassium chloride (KLOR-CON) 10 MEQ tablet & would like a rx sent in for capsules instead  Also patient says he is taking 2x daily instead of 3x daily which is written on original rx  Please update rx & send to pharmacy: [facility] - [place], [place]  Phone:  [phone] Fax:  [phone]

## 2020-12-02 NOTE — Telephone Encounter (Signed)
Called pt, LVM.   

## 2020-12-02 NOTE — Telephone Encounter (Signed)
Already addressed sept 1 message

## 2021-01-12 ENCOUNTER — Telehealth: Payer: Self-pay | Admitting: Internal Medicine

## 2021-01-12 NOTE — Telephone Encounter (Signed)
Notified patient via voicemail that paperwork is complete

## 2021-01-12 NOTE — Telephone Encounter (Signed)
Type of form received handicapped placard  Form placed in Provider mailbox  Additional instructions from the patient :notify by phone when complete   Things to remember: [place] office: If form received in person, remind patient that forms take 7-10 business days CMA should attach charge sheet and put on Supervisor's desk

## 2021-02-09 ENCOUNTER — Ambulatory Visit: Payer: Medicare PPO | Admitting: Orthopedic Surgery

## 2021-02-09 DIAGNOSIS — M170 Bilateral primary osteoarthritis of knee: Secondary | ICD-10-CM

## 2021-02-23 ENCOUNTER — Other Ambulatory Visit: Payer: Self-pay

## 2021-02-23 ENCOUNTER — Ambulatory Visit: Payer: Medicare PPO | Admitting: Nurse Practitioner

## 2021-02-23 ENCOUNTER — Ambulatory Visit (INDEPENDENT_AMBULATORY_CARE_PROVIDER_SITE_OTHER)
Admission: RE | Admit: 2021-02-23 | Discharge: 2021-02-23 | Disposition: A | Discharge status: Home / Self Care | Payer: Medicare PPO | Source: Ambulatory Visit | Attending: Nurse Practitioner | Admitting: Nurse Practitioner

## 2021-02-23 ENCOUNTER — Encounter: Payer: Self-pay | Admitting: Nurse Practitioner

## 2021-02-23 VITALS — BP 108/64 | HR 66 | Temp 97.1°F | Resp 14 | Ht 67.0 in | Wt 223.1 lb

## 2021-02-23 DIAGNOSIS — R051 Acute cough: Secondary | ICD-10-CM

## 2021-02-23 DIAGNOSIS — R0989 Other specified symptoms and signs involving the circulatory and respiratory systems: Secondary | ICD-10-CM | POA: Diagnosis not present

## 2021-02-23 DIAGNOSIS — J069 Acute upper respiratory infection, unspecified: Secondary | ICD-10-CM

## 2021-02-23 DIAGNOSIS — R062 Wheezing: Secondary | ICD-10-CM | POA: Diagnosis not present

## 2021-02-23 DIAGNOSIS — R059 Cough, unspecified: Secondary | ICD-10-CM | POA: Diagnosis not present

## 2021-02-23 LAB — POC COVID19 BINAXNOW: SARS Coronavirus 2 Ag: NEGATIVE

## 2021-02-23 MED ORDER — HYDROCODONE BIT-HOMATROP MBR 5-1.5 MG/5ML PO SOLN: 5.0000 mL | Freq: Every evening | ORAL | 0 refills | Status: AC | PRN

## 2021-02-23 MED ORDER — PREDNISONE 20 MG PO TABS: ORAL_TABLET | ORAL | 0 refills | Status: AC

## 2021-02-23 NOTE — Patient Instructions (Signed)
Nice to see you Joel King be in touch in regards to chest xray Follow up if symptoms fail to improve or get worse

## 2021-02-23 NOTE — Assessment & Plan Note (Signed)
Patient symptoms seem to be improving per patient report we will send in something for cough and wheezing pending chest x-ray result.  Continue to monitor.  Signs symptoms reviewed as when to seek urgent or emergent health care.

## 2021-02-23 NOTE — Progress Notes (Signed)
Acute Office Visit  Subjective:    Patient ID: Joel King, male    DOB: 1951-03-16, 70 y.o.   MRN: [id number]  Chief Complaint  Patient presents with   Cough    Sx started around 02/19/21. Runny nose, sneezing, post nasal drip, some wheezing. No fever or sore throat. Has taking Nyquil     Patient is in today for Cough  Symptoms started about 4 days ago. No at home covid test Vaccinated against covid No sick contacts. Does work at [employer] 4 hours a day Worse with laying down Nyquill has helped some.  Symptoms seem to be improving per patient report  Past Medical History:  Diagnosis Date   ANXIETY 11/21/2006   Cancer ([place])    skin cancer left cheek   DEPRESSION 05/31/2008   DIABETES MELLITUS, TYPE II 07/22/2007   no meds   Diarrhea 05/31/2008   DVT (deep venous thrombosis) ([place]) 02/08/2014   HYPERLIPIDEMIA 11/21/2006   HYPERTENSION 11/17/2006   INSOMNIA-SLEEP DISORDER-UNSPEC 07/21/2007   OBESITY, MILD 11/17/2006   PEPTIC ULCER DISEASE 07/22/2007   PEPTIC ULCER DISEASE, HX OF 11/17/2006   Post-operative nausea and vomiting    past 2 surgeries   SINUSITIS- ACUTE-NOS 12/08/2007   SKIN LESION 12/08/2007    Past Surgical History:  Procedure Laterality Date   inguinal herniorrhapy     bilat   s/p incarcerated recurrent ventral hernia     s/p meckel's diverticulum      Family History  Problem Relation Age of Onset   Heart disease Mother    Stroke Other    Cancer Other        prostate   Heart disease Father    COPD Brother     Social History   Socioeconomic History   Marital status: Single    Spouse name: Not on file   Number of children: Not on file   Years of education: Not on file   Highest education level: Not on file  Occupational History   Not on file  Tobacco Use   Smoking status: Never   Smokeless tobacco: Never  Vaping Use   Vaping Use: Never used  Substance and Sexual Activity   Alcohol use: No   Drug use: No   Sexual  activity: Not on file  Other Topics Concern   Not on file  Social History Narrative   Lived with mother for 3 yrs   Social Determinants of Health   Financial Resource Strain: Not on file  Food Insecurity: Not on file  Transportation Needs: Not on file  Physical Activity: Not on file  Stress: Not on file  Social Connections: Not on file  Intimate Partner Violence: Not on file    Outpatient Medications Prior to Visit  Medication Sig Dispense Refill   amLODipine (NORVASC) 10 MG tablet Take 1 tablet (10 mg total) by mouth daily. 90 tablet 3   aspirin 81 MG tablet Take 81 mg by mouth daily.     atenolol-chlorthalidone (TENORETIC) 100-25 MG tablet Take 1 tablet by mouth daily. 90 tablet 3   benazepril (LOTENSIN) 40 MG tablet Take 1 tablet (40 mg total) by mouth daily. 90 tablet 3   famotidine (PEPCID) 20 MG tablet Take 1 tablet (20 mg total) by mouth 2 (two) times daily. 180 tablet 3   finasteride (PROSCAR) 5 MG tablet Take 1 tablet (5 mg total) by mouth daily. 90 tablet 3   Potassium Chloride (KLOR-CON 10 PO) Take 20 capsules by  mouth daily.     potassium chloride (KLOR-CON) 10 MEQ tablet Take 3 tablets (30 mEq total) by mouth daily. (Patient taking differently: Take 20 mEq by mouth daily.) 270 tablet 3   simvastatin (ZOCOR) 40 MG tablet Take 1 tablet (40 mg total) by mouth at bedtime. 90 tablet 3   Multiple Vitamins-Minerals (CENTRUM SILVER PO) Take by mouth daily at 6 (six) AM. (Patient not taking: Reported on 02/23/2021)     MYRBETRIQ 50 MG TB24 tablet      PROLENSA 0.07 % SOLN      No facility-administered medications prior to visit.    Allergies  Allergen Reactions   Atorvastatin     Unsure of results   Pravastatin Sodium     Unsure of reaction   Rosuvastatin     Unsure of reaction    Review of Systems  Constitutional:  Negative for chills, fatigue and fever.  HENT:  Positive for congestion and rhinorrhea. Negative for ear discharge, ear pain, sinus pressure, sinus  pain and sore throat.   Respiratory:  Positive for cough (white, thin). Negative for shortness of breath.   Cardiovascular:  Negative for chest pain.  Gastrointestinal:  Negative for diarrhea, nausea and vomiting.  Neurological:  Negative for headaches.      Objective:    Physical Exam HENT:     Right Ear: Tympanic membrane, ear canal and external ear normal. There is no impacted cerumen.     Left Ear: Tympanic membrane, ear canal and external ear normal. There is no impacted cerumen.     Mouth/Throat:     Mouth: Mucous membranes are moist.  Eyes:     Extraocular Movements: Extraocular movements intact.  Cardiovascular:     Rate and Rhythm: Normal rate and regular rhythm.  Pulmonary:     Effort: Pulmonary effort is normal.     Breath sounds: Wheezing, rhonchi and rales present.     Comments: Rhonchi that cleared with cough. Expiratory wheezing, possible fine crackles at LLL Abdominal:     General: Bowel sounds are normal.  Lymphadenopathy:     Cervical: No cervical adenopathy.  Skin:    General: Skin is warm.  Psychiatric:        Mood and Affect: Mood normal.        Behavior: Behavior normal.        Thought Content: Thought content normal.        Judgment: Judgment normal.    BP 108/64   Pulse 66   Temp (!) 97.1 F (36.2 C)   Resp 14   Ht 5\' 7"  (1.702 m)   Wt 223 lb 2 oz (101.2 kg)   SpO2 94%   BMI 34.95 kg/m  Wt Readings from Last 3 Encounters:  02/23/21 223 lb 2 oz (101.2 kg)  11/10/20 235 lb (106.6 kg)  11/04/20 235 lb (106.6 kg)    Health Maintenance Due  Topic Date Due   Zoster Vaccines- Shingrix (1 of 2) Never done   OPHTHALMOLOGY EXAM  01/27/2021    There are no preventive care reminders to display for this patient.   Lab Results  Component Value Date   TSH 1.45 10/27/2020   Lab Results  Component Value Date   WBC 8.0 10/27/2020   HGB 15.4 10/27/2020   HCT 43.8 10/27/2020   MCV 92.3 10/27/2020   PLT 196.0 10/27/2020   Lab Results   Component Value Date   NA 140 10/27/2020   K 3.4 (L) 10/27/2020   CO2  27 10/27/2020   GLUCOSE 106 (H) 10/27/2020   BUN 21 10/27/2020   CREATININE 0.91 10/27/2020   BILITOT 0.6 10/27/2020   ALKPHOS 61 10/27/2020   AST 17 10/27/2020   ALT 13 10/27/2020   PROT 6.7 10/27/2020   ALBUMIN 4.3 10/27/2020   CALCIUM 9.3 10/27/2020   GFR 85.48 10/27/2020   Lab Results  Component Value Date   CHOL 120 10/27/2020   Lab Results  Component Value Date   HDL 31.50 (L) 10/27/2020   Lab Results  Component Value Date   LDLCALC 55 10/27/2020   Lab Results  Component Value Date   TRIG 166.0 (H) 10/27/2020   Lab Results  Component Value Date   CHOLHDL 4 10/27/2020   Lab Results  Component Value Date   HGBA1C 5.6 10/27/2020       Assessment & Plan:   Problem List Items Addressed This Visit       Respiratory   Viral upper respiratory tract infection    Patient symptoms seem to be improving per patient report we will send in something for cough and wheezing pending chest x-ray result.  Continue to monitor.  Signs symptoms reviewed as when to seek urgent or emergent health care.        Other   Cough - Primary    Has done well with Hydromet cough syrup in the past we will send in for at nighttime use patient educated and acknowledged.  Did discuss this medication may make him dizzy especially gets up at night using the restroom sedation cautions reviewed      Relevant Medications   HYDROcodone bit-homatropine (HYCODAN) 5-1.5 MG/5ML syrup   Other Relevant Orders   DG Chest 2 View (Completed)   POC COVID-19 (Completed)   Wheezing   Relevant Medications   predniSONE (DELTASONE) 20 MG tablet   Other Relevant Orders   DG Chest 2 View (Completed)   POC COVID-19 (Completed)     No orders of the defined types were placed in this encounter.  This visit occurred during the SARS-CoV-2 public health emergency.  Safety protocols were in place, including screening questions prior to  the visit, additional usage of staff PPE, and extensive cleaning of exam room while observing appropriate contact time as indicated for disinfecting solutions.   Romilda Garret, NP

## 2021-02-23 NOTE — Assessment & Plan Note (Signed)
Has done well with Hydromet cough syrup in the past we will send in for at nighttime use patient educated and acknowledged.  Did discuss this medication may make him dizzy especially gets up at night using the restroom sedation cautions reviewed

## 2021-02-24 ENCOUNTER — Other Ambulatory Visit: Payer: Self-pay | Admitting: Nurse Practitioner

## 2021-02-24 DIAGNOSIS — J40 Bronchitis, not specified as acute or chronic: Secondary | ICD-10-CM

## 2021-02-24 MED ORDER — AZITHROMYCIN 250 MG PO TABS
ORAL_TABLET | ORAL | 0 refills | Status: AC
Start: 2021-02-24 — End: 2021-03-01

## 2021-02-26 ENCOUNTER — Ambulatory Visit: Payer: Medicare PPO | Admitting: Internal Medicine

## 2021-03-05 DIAGNOSIS — B078 Other viral warts: Secondary | ICD-10-CM | POA: Diagnosis not present

## 2021-03-05 DIAGNOSIS — T148XXA Other injury of unspecified body region, initial encounter: Secondary | ICD-10-CM | POA: Diagnosis not present

## 2021-03-18 ENCOUNTER — Encounter: Payer: Self-pay | Admitting: Orthopedic Surgery

## 2021-03-18 DIAGNOSIS — M170 Bilateral primary osteoarthritis of knee: Secondary | ICD-10-CM

## 2021-03-18 NOTE — Progress Notes (Signed)
Office Visit Note   Patient: Joel King           Date of Birth: 1950-08-25           MRN: [id number] Visit Date: 02/09/2021              Requested by: Biagio Borg, MD [address] [place],  [place] [address] PCP: Biagio Borg, MD  Chief Complaint  Patient presents with   Right Knee - Pain    S/p cortisone shot 11/10/20   Left Knee - Pain    S/p cortisone shot 11/10/20      HPI: Patient is a 70 year old gentleman with osteoarthritis bilateral knees he has had temporary relief from previous injections.  He currently wears compression socks for venous insufficiency.  Last injection was 3 months ago.  Assessment & Plan: Visit Diagnoses:  1. Bilateral primary osteoarthritis of knee     Plan: Both knees were injected plan to follow-up as needed.  Follow-Up Instructions: Return if symptoms worsen or fail to improve.   Ortho Exam  Patient is alert, oriented, no adenopathy, well-dressed, normal affect, normal respiratory effort. Examination patient has mild effusion of both knees collaterals and cruciates are stable there is a minimal effusion no cellulitis of either knee  Imaging: No results found. No images are attached to the encounter.  Labs: Lab Results  Component Value Date   HGBA1C 5.6 10/27/2020   HGBA1C 5.7 04/22/2020   HGBA1C 5.4 10/15/2019     Lab Results  Component Value Date   ALBUMIN 4.3 10/27/2020   ALBUMIN 4.6 04/22/2020   ALBUMIN 4.6 10/09/2018    Lab Results  Component Value Date   MG 1.9 10/27/2020   MG 1.8 04/22/2020   Lab Results  Component Value Date   VD25OH 35.48 10/27/2020   VD25OH 21 (L) 10/15/2019    No results found for: PREALBUMIN CBC EXTENDED Latest Ref Rng & Units 10/27/2020 10/15/2019 10/09/2018  WBC 4.0 - 10.5 K/uL 8.0 9.1 9.7  RBC 4.22 - 5.81 Mil/uL 4.74 5.07 5.10  HGB 13.0 - 17.0 g/dL 15.4 16.5 16.4  HCT 39.0 - 52.0 % 43.8 47.3 47.4  PLT 150.0 - 400.0 K/uL 196.0 217 208.0  NEUTROABS 1.4 - 7.7 K/uL 6.2 6,871  7.2  LYMPHSABS 0.7 - 4.0 K/uL 0.9 983 1.3     There is no height or weight on file to calculate BMI.  Orders:  No orders of the defined types were placed in this encounter.  No orders of the defined types were placed in this encounter.    Procedures: Large Joint Inj: bilateral knee on 03/18/2021 3:37 PM Indications: pain and diagnostic evaluation Details: 22 G 1.5 in needle, anteromedial approach  Arthrogram: No  Outcome: tolerated well, no immediate complications Procedure, treatment alternatives, risks and benefits explained, specific risks discussed. Consent was given by the patient. Immediately prior to procedure a time out was called to verify the correct patient, procedure, equipment, support staff and site/side marked as required. Patient was prepped and draped in the usual sterile fashion.     Clinical Data: No additional findings.  ROS:  All other systems negative, except as noted in the HPI. Review of Systems  Objective: Vital Signs: There were no vitals taken for this visit.  Specialty Comments:  No specialty comments available.  PMFS History: Patient Active Problem List   Diagnosis Date Noted   Wheezing 07/15/2020   Vitamin D deficiency 10/23/2019   UTI (urinary tract infection)  10/23/2019   Vertigo 02/06/2019   Cough 03/15/2018   BPH (benign prostatic hyperplasia) 08/13/2017   Effusion, right knee 12/13/2016   Bilateral primary osteoarthritis of knee 08/24/2016   Plantar fasciitis 03/16/2016   Achilles tendinitis, right leg 03/16/2016   Rash 01/15/2016   Stasis dermatitis 01/15/2016   Chronic venous insufficiency 12/30/2015   Cellulitis 12/30/2015   Itching 12/09/2015   Dizziness 03/25/2015   Skin lesion of face 03/25/2015   Abnormal liver function 03/25/2015   Hypokalemia 03/25/2015   Viral upper respiratory tract infection 01/14/2015   Neuritis of right lower extremity 08/20/2014   DVT (deep venous thrombosis) ([place]) 02/08/2014   Right  knee pain 12/22/2013   Bronchitis 02/28/2012   Fatigue 11/02/2010   Encounter for well adult exam with abnormal findings 11/01/2010   DEPRESSION 05/31/2008   Diarrhea 05/31/2008   SKIN LESION 12/08/2007   Pre-diabetes 07/22/2007   PEPTIC ULCER DISEASE 07/22/2007   INSOMNIA-SLEEP DISORDER-UNSPEC 07/21/2007   Hyperlipidemia 11/21/2006   Anxiety state 11/21/2006   OBESITY, MILD 11/17/2006   Essential hypertension 11/17/2006   Past Medical History:  Diagnosis Date   ANXIETY 11/21/2006   Cancer ([place])    skin cancer left cheek   DEPRESSION 05/31/2008   DIABETES MELLITUS, TYPE II 07/22/2007   no meds   Diarrhea 05/31/2008   DVT (deep venous thrombosis) ([place]) 02/08/2014   HYPERLIPIDEMIA 11/21/2006   HYPERTENSION 11/17/2006   INSOMNIA-SLEEP DISORDER-UNSPEC 07/21/2007   OBESITY, MILD 11/17/2006   PEPTIC ULCER DISEASE 07/22/2007   PEPTIC ULCER DISEASE, HX OF 11/17/2006   Post-operative nausea and vomiting    past 2 surgeries   SINUSITIS- ACUTE-NOS 12/08/2007   SKIN LESION 12/08/2007    Family History  Problem Relation Age of Onset   Heart disease Mother    Stroke Other    Cancer Other        prostate   Heart disease Father    COPD Brother     Past Surgical History:  Procedure Laterality Date   inguinal herniorrhapy     bilat   s/p incarcerated recurrent ventral hernia     s/p meckel's diverticulum     Social History   Occupational History   Not on file  Tobacco Use   Smoking status: Never   Smokeless tobacco: Never  Vaping Use   Vaping Use: Never used  Substance and Sexual Activity   Alcohol use: No   Drug use: No   Sexual activity: Not on file

## 2021-04-03 ENCOUNTER — Encounter: Payer: Self-pay | Admitting: Podiatry

## 2021-04-03 ENCOUNTER — Other Ambulatory Visit: Payer: Self-pay

## 2021-04-03 ENCOUNTER — Ambulatory Visit (INDEPENDENT_AMBULATORY_CARE_PROVIDER_SITE_OTHER): Payer: Medicare PPO | Admitting: Podiatry

## 2021-04-03 DIAGNOSIS — M7671 Peroneal tendinitis, right leg: Secondary | ICD-10-CM | POA: Diagnosis not present

## 2021-04-03 MED ORDER — TRIAMCINOLONE ACETONIDE 10 MG/ML IJ SUSP
10.0000 mg | Freq: Once | INTRAMUSCULAR | Status: AC
  Administered 2021-04-03: 10 mg

## 2021-04-06 NOTE — Progress Notes (Signed)
Subjective:   Patient ID: Joel King, male   DOB: 71 y.o.   MRN: [id number]   HPI Patient presents stating he had severe pain in the outside of his right foot.  States it is improved over where it was but it is still present and it is somewhat swollen.  Patient does not remember specific injury but states the pain has been intense   ROS      Objective:  Physical Exam  Neurovascular status intact with pain in the lateral foot right with inflammation fluid buildup around the peroneal tendon group with no indications of muscle dysfunction     Assessment:  Probability for peroneal tendinitis right cannot rule out other pathology     Plan:  Reviewed condition recommended good shoe gear usage and due to the fact there is 1 area of acute inflammation I did go ahead did sterile prep and injected 3 mg dexamethasone Kenalog 5 mg Xylocaine advised on ice therapy and will reappoint as needed  X-rays were negative for signs of fracture or bony injury associated with condition

## 2021-05-11 ENCOUNTER — Ambulatory Visit: Payer: Medicare PPO | Admitting: Orthopedic Surgery

## 2021-05-11 ENCOUNTER — Other Ambulatory Visit: Payer: Self-pay

## 2021-05-11 DIAGNOSIS — M170 Bilateral primary osteoarthritis of knee: Secondary | ICD-10-CM

## 2021-05-13 ENCOUNTER — Encounter: Payer: Self-pay | Admitting: Orthopedic Surgery

## 2021-05-13 DIAGNOSIS — M170 Bilateral primary osteoarthritis of knee: Secondary | ICD-10-CM

## 2021-05-13 NOTE — Progress Notes (Signed)
Office Visit Note   Patient: Joel King           Date of Birth: 12-28-1950           MRN: [id number] Visit Date: 05/11/2021              Requested by: Biagio Borg, MD [address] [place],  [place] [address] PCP: Biagio Borg, MD  Chief Complaint  Patient presents with   Right Leg - Follow-up    S/p injection 02/09/2021 bilat knees   Left Knee - Follow-up      HPI: Patient is a 71 year old gentleman who presents in follow-up for osteoarthritis bilateral knees.  He had a previous steroid injection about 2 months ago.  Assessment & Plan: Visit Diagnoses:  1. Bilateral primary osteoarthritis of knee     Plan: Both knees were injected he tolerated this well.  Follow-Up Instructions: Return if symptoms worsen or fail to improve.   Ortho Exam  Patient is alert, oriented, no adenopathy, well-dressed, normal affect, normal respiratory effort. Examination patient has venous swelling of both lower extremities there is no ulcers.  There is no effusion of either knee he is tender to palpation over the medial lateral joint lines bilaterally collaterals and cruciates are stable.  Imaging: No results found. No images are attached to the encounter.  Labs: Lab Results  Component Value Date   HGBA1C 5.6 10/27/2020   HGBA1C 5.7 04/22/2020   HGBA1C 5.4 10/15/2019     Lab Results  Component Value Date   ALBUMIN 4.3 10/27/2020   ALBUMIN 4.6 04/22/2020   ALBUMIN 4.6 10/09/2018    Lab Results  Component Value Date   MG 1.9 10/27/2020   MG 1.8 04/22/2020   Lab Results  Component Value Date   VD25OH 35.48 10/27/2020   VD25OH 21 (L) 10/15/2019    No results found for: PREALBUMIN CBC EXTENDED Latest Ref Rng & Units 10/27/2020 10/15/2019 10/09/2018  WBC 4.0 - 10.5 K/uL 8.0 9.1 9.7  RBC 4.22 - 5.81 Mil/uL 4.74 5.07 5.10  HGB 13.0 - 17.0 g/dL 15.4 16.5 16.4  HCT 39.0 - 52.0 % 43.8 47.3 47.4  PLT 150.0 - 400.0 K/uL 196.0 217 208.0  NEUTROABS 1.4 - 7.7 K/uL 6.2  6,871 7.2  LYMPHSABS 0.7 - 4.0 K/uL 0.9 983 1.3     There is no height or weight on file to calculate BMI.  Orders:  No orders of the defined types were placed in this encounter.  No orders of the defined types were placed in this encounter.    Procedures: Large Joint Inj: bilateral knee on 05/13/2021 4:05 PM Indications: pain and diagnostic evaluation Details: 22 G 1.5 in needle, anteromedial approach  Arthrogram: No  Outcome: tolerated well, no immediate complications Procedure, treatment alternatives, risks and benefits explained, specific risks discussed. Consent was given by the patient. Immediately prior to procedure a time out was called to verify the correct patient, procedure, equipment, support staff and site/side marked as required. Patient was prepped and draped in the usual sterile fashion.     Clinical Data: No additional findings.  ROS:  All other systems negative, except as noted in the HPI. Review of Systems  Objective: Vital Signs: There were no vitals taken for this visit.  Specialty Comments:  No specialty comments available.  PMFS History: Patient Active Problem List   Diagnosis Date Noted   Wheezing 07/15/2020   Vitamin D deficiency 10/23/2019   UTI (urinary tract infection) 10/23/2019  Vertigo 02/06/2019   Cough 03/15/2018   BPH (benign prostatic hyperplasia) 08/13/2017   Effusion, right knee 12/13/2016   Bilateral primary osteoarthritis of knee 08/24/2016   Plantar fasciitis 03/16/2016   Achilles tendinitis, right leg 03/16/2016   Rash 01/15/2016   Stasis dermatitis 01/15/2016   Chronic venous insufficiency 12/30/2015   Cellulitis 12/30/2015   Itching 12/09/2015   Dizziness 03/25/2015   Skin lesion of face 03/25/2015   Abnormal liver function 03/25/2015   Hypokalemia 03/25/2015   Viral upper respiratory tract infection 01/14/2015   Neuritis of right lower extremity 08/20/2014   DVT (deep venous thrombosis) ([place]) 02/08/2014    Right knee pain 12/22/2013   Bronchitis 02/28/2012   Fatigue 11/02/2010   Encounter for well adult exam with abnormal findings 11/01/2010   DEPRESSION 05/31/2008   Diarrhea 05/31/2008   SKIN LESION 12/08/2007   Pre-diabetes 07/22/2007   PEPTIC ULCER DISEASE 07/22/2007   INSOMNIA-SLEEP DISORDER-UNSPEC 07/21/2007   Hyperlipidemia 11/21/2006   Anxiety state 11/21/2006   OBESITY, MILD 11/17/2006   Essential hypertension 11/17/2006   Past Medical History:  Diagnosis Date   ANXIETY 11/21/2006   Cancer ([facility])    skin cancer left cheek   DEPRESSION 05/31/2008   DIABETES MELLITUS, TYPE II 07/22/2007   no meds   Diarrhea 05/31/2008   DVT (deep venous thrombosis) ([facility]) 02/08/2014   HYPERLIPIDEMIA 11/21/2006   HYPERTENSION 11/17/2006   INSOMNIA-SLEEP DISORDER-UNSPEC 07/21/2007   OBESITY, MILD 11/17/2006   PEPTIC ULCER DISEASE 07/22/2007   PEPTIC ULCER DISEASE, HX OF 11/17/2006   Post-operative nausea and vomiting    past 2 surgeries   SINUSITIS- ACUTE-NOS 12/08/2007   SKIN LESION 12/08/2007    Family History  Problem Relation Age of Onset   Heart disease Mother    Stroke Other    Cancer Other        prostate   Heart disease Father    COPD Brother     Past Surgical History:  Procedure Laterality Date   inguinal herniorrhapy     bilat   s/p incarcerated recurrent ventral hernia     s/p meckel's diverticulum     Social History   Occupational History   Not on file  Tobacco Use   Smoking status: Never   Smokeless tobacco: Never  Vaping Use   Vaping Use: Never used  Substance and Sexual Activity   Alcohol use: No   Drug use: No   Sexual activity: Not on file

## 2021-05-21 ENCOUNTER — Ambulatory Visit: Payer: Medicare PPO | Admitting: Podiatry

## 2021-05-21 ENCOUNTER — Other Ambulatory Visit: Payer: Self-pay

## 2021-05-21 ENCOUNTER — Encounter: Payer: Self-pay | Admitting: Podiatry

## 2021-05-21 DIAGNOSIS — M722 Plantar fascial fibromatosis: Secondary | ICD-10-CM | POA: Diagnosis not present

## 2021-05-21 MED ORDER — TRIAMCINOLONE ACETONIDE 10 MG/ML IJ SUSP
10.0000 mg | Freq: Once | INTRAMUSCULAR | Status: AC
  Administered 2021-05-21: 10 mg

## 2021-05-22 NOTE — Progress Notes (Signed)
Subjective:   Patient ID: Joel King, male   DOB: 71 y.o.   MRN: [id number]   HPI Patient presents stating he has developed severe discomfort in his left plantar fascia over the last few weeks does not remember specific injury and its been hard to walk on   ROS      Objective:  Physical Exam  Vascular status intact exquisite discomfort medial fascial band left heel at the insertional point tendon calcaneus     Assessment:  Acute Planter fasciitis left with inflammation fluid around the medial band     Plan:  H&P reviewed condition and went ahead today did sterile prep and injected the fascia 3 mg Kenalog 5 mg Xylocaine and applied ice therapy and support therapy.  Reappoint if symptoms indicate

## 2021-06-22 ENCOUNTER — Telehealth: Payer: Self-pay | Admitting: Internal Medicine

## 2021-06-22 NOTE — Telephone Encounter (Signed)
LVM for pt to rtn my call to schedule AWV-I with [facility]. Please schedule this appt if pt calls the office.  ?

## 2021-07-06 ENCOUNTER — Ambulatory Visit (INDEPENDENT_AMBULATORY_CARE_PROVIDER_SITE_OTHER): Payer: Medicare PPO

## 2021-07-06 DIAGNOSIS — Z0000 Encounter for general adult medical examination without abnormal findings: Secondary | ICD-10-CM

## 2021-07-06 NOTE — Patient Instructions (Signed)
Joel King , ?Thank you for taking time to come for your Medicare Wellness Visit. I appreciate your ongoing commitment to your health goals. Please review the following plan we discussed and let me know if I can assist you in the future.  ? ?Screening recommendations/referrals: ?Colonoscopy: 09/26/2017  due 2024 ?Recommended yearly ophthalmology/optometry visit for glaucoma screening and checkup ?Recommended yearly dental visit for hygiene and checkup ? ?Vaccinations: ?Influenza vaccine: completed  ?Pneumococcal vaccine: completed  ?Tdap vaccine: 11/21/2014 ?Shingles vaccine: will consider    ? ?Advanced directives: none  ? ?Conditions/risks identified: none  ? ?Next appointment: none  ? ?Preventive Care 40 Years and Older, Male ?Preventive care refers to lifestyle choices and visits with your health care provider that can promote health and wellness. ?What does preventive care include? ?A yearly physical exam. This is also called an annual well check. ?Dental exams once or twice a year. ?Routine eye exams. Ask your health care provider how often you should have your eyes checked. ?Personal lifestyle choices, including: ?Daily care of your teeth and gums. ?Regular physical activity. ?Eating a healthy diet. ?Avoiding tobacco and drug use. ?Limiting alcohol use. ?Practicing safe sex. ?Taking low doses of aspirin every day. ?Taking vitamin and mineral supplements as recommended by your health care provider. ?What happens during an annual well check? ?The services and screenings done by your health care provider during your annual well check will depend on your age, overall health, lifestyle risk factors, and family history of disease. ?Counseling  ?Your health care provider may ask you questions about your: ?Alcohol use. ?Tobacco use. ?Drug use. ?Emotional well-being. ?Home and relationship well-being. ?Sexual activity. ?Eating habits. ?History of falls. ?Memory and ability to understand (cognition). ?Work and work  Statistician. ?Screening  ?You may have the following tests or measurements: ?Height, weight, and BMI. ?Blood pressure. ?Lipid and cholesterol levels. These may be checked every 5 years, or more frequently if you are over 86 years old. ?Skin check. ?Lung cancer screening. You may have this screening every year starting at age 40 if you have a 30-pack-year history of smoking and currently smoke or have quit within the past 15 years. ?Fecal occult blood test (FOBT) of the stool. You may have this test every year starting at age 51. ?Flexible sigmoidoscopy or colonoscopy. You may have a sigmoidoscopy every 5 years or a colonoscopy every 10 years starting at age 22. ?Prostate cancer screening. Recommendations will vary depending on your family history and other risks. ?Hepatitis C blood test. ?Hepatitis B blood test. ?Sexually transmitted disease (STD) testing. ?Diabetes screening. This is done by checking your blood sugar (glucose) after you have not eaten for a while (fasting). You may have this done every 1-3 years. ?Abdominal aortic aneurysm (AAA) screening. You may need this if you are a current or former smoker. ?Osteoporosis. You may be screened starting at age 58 if you are at high risk. ?Talk with your health care provider about your test results, treatment options, and if necessary, the need for more tests. ?Vaccines  ?Your health care provider may recommend certain vaccines, such as: ?Influenza vaccine. This is recommended every year. ?Tetanus, diphtheria, and acellular pertussis (Tdap, Td) vaccine. You may need a Td booster every 10 years. ?Zoster vaccine. You may need this after age 33. ?Pneumococcal 13-valent conjugate (PCV13) vaccine. One dose is recommended after age 23. ?Pneumococcal polysaccharide (PPSV23) vaccine. One dose is recommended after age 17. ?Talk to your health care provider about which screenings and vaccines you need and  how often you need them. ?This information is not intended to replace  advice given to you by your health care provider. Make sure you discuss any questions you have with your health care provider. ?Document Released: 04/11/2015 Document Revised: 12/03/2015 Document Reviewed: 01/14/2015 ?Elsevier Interactive Patient Education ? 2017 [employer]. ? ?Fall Prevention in the Home ?Falls can cause injuries. They can happen to people of all ages. There are many things you can do to make your home safe and to help prevent falls. ?What can I do on the outside of my home? ?Regularly fix the edges of walkways and driveways and fix any cracks. ?Remove anything that might make you trip as you walk through a door, such as a raised step or threshold. ?Trim any bushes or trees on the path to your home. ?Use bright outdoor lighting. ?Clear any walking paths of anything that might make someone trip, such as rocks or tools. ?Regularly check to see if handrails are loose or broken. Make sure that both sides of any steps have handrails. ?Any raised decks and porches should have guardrails on the edges. ?Have any leaves, snow, or ice cleared regularly. ?Use sand or salt on walking paths during winter. ?Clean up any spills in your garage right away. This includes oil or grease spills. ?What can I do in the bathroom? ?Use night lights. ?Install grab bars by the toilet and in the tub and shower. Do not use towel bars as grab bars. ?Use non-skid mats or decals in the tub or shower. ?If you need to sit down in the shower, use a plastic, non-slip stool. ?Keep the floor dry. Clean up any water that spills on the floor as soon as it happens. ?Remove soap buildup in the tub or shower regularly. ?Attach bath mats securely with double-sided non-slip rug tape. ?Do not have throw rugs and other things on the floor that can make you trip. ?What can I do in the bedroom? ?Use night lights. ?Make sure that you have a light by your bed that is easy to reach. ?Do not use any sheets or blankets that are too big for your bed.  They should not hang down onto the floor. ?Have a firm chair that has side arms. You can use this for support while you get dressed. ?Do not have throw rugs and other things on the floor that can make you trip. ?What can I do in the kitchen? ?Clean up any spills right away. ?Avoid walking on wet floors. ?Keep items that you use a lot in easy-to-reach places. ?If you need to reach something above you, use a strong step stool that has a grab bar. ?Keep electrical cords out of the way. ?Do not use floor polish or wax that makes floors slippery. If you must use wax, use non-skid floor wax. ?Do not have throw rugs and other things on the floor that can make you trip. ?What can I do with my stairs? ?Do not leave any items on the stairs. ?Make sure that there are handrails on both sides of the stairs and use them. Fix handrails that are broken or loose. Make sure that handrails are as long as the stairways. ?Check any carpeting to make sure that it is firmly attached to the stairs. Fix any carpet that is loose or worn. ?Avoid having throw rugs at the top or bottom of the stairs. If you do have throw rugs, attach them to the floor with carpet tape. ?Make sure that you have  a light switch at the top of the stairs and the bottom of the stairs. If you do not have them, ask someone to add them for you. ?What else can I do to help prevent falls? ?Wear shoes that: ?Do not have high heels. ?Have rubber bottoms. ?Are comfortable and fit you well. ?Are closed at the toe. Do not wear sandals. ?If you use a stepladder: ?Make sure that it is fully opened. Do not climb a closed stepladder. ?Make sure that both sides of the stepladder are locked into place. ?Ask someone to hold it for you, if possible. ?Clearly mark and make sure that you can see: ?Any grab bars or handrails. ?First and last steps. ?Where the edge of each step is. ?Use tools that help you move around (mobility aids) if they are needed. These  include: ?Canes. ?Walkers. ?Scooters. ?Crutches. ?Turn on the lights when you go into a dark area. Replace any light bulbs as soon as they burn out. ?Set up your furniture so you have a clear path. Avoid moving your furniture aro

## 2021-07-06 NOTE — Progress Notes (Signed)
? ?Subjective:  ? Joel King is a 71 y.o. male who presents for an Subsequent Medicare Annual Wellness Visit. ? ?I connected with Joel King today by telephone and verified that I am speaking with the correct person using two identifiers. ?Location patient: home ?Location provider: work ?Persons participating in the virtual visit: patient, provider. ?  ?I discussed the limitations, risks, security and privacy concerns of performing an evaluation and management service by telephone and the availability of in person appointments. I also discussed with the patient that there may be a patient responsible charge related to this service. The patient expressed understanding and verbally consented to this telephonic visit.  ?  ?Interactive audio and video telecommunications were attempted between this provider and patient, however failed, due to patient having technical difficulties OR patient did not have access to video capability.  We continued and completed visit with audio only. ? ?  ?Review of Systems    ? ?Cardiac Risk Factors include: advanced age (>65mn, >>29women);dyslipidemia;male gender ? ?   ?Objective:  ?  ?Today's Vitals  ? ?There is no height or weight on file to calculate BMI. ? ? ?  07/06/2021  ?  8:50 AM 09/26/2017  ?  7:50 AM  ?Advanced Directives  ?Does Patient Have a Medical Advance Directive? No No  ?Would patient like information on creating a medical advance directive? No - Patient declined   ? ? ?Current Medications (verified) ?Outpatient Encounter Medications as of 07/06/2021  ?Medication Sig  ? amLODipine (NORVASC) 10 MG tablet Take 1 tablet (10 mg total) by mouth daily.  ? aspirin 81 MG tablet Take 81 mg by mouth daily.  ? atenolol-chlorthalidone (TENORETIC) 100-25 MG tablet Take 1 tablet by mouth daily.  ? benazepril (LOTENSIN) 40 MG tablet Take 1 tablet (40 mg total) by mouth daily.  ? famotidine (PEPCID) 20 MG tablet Take 1 tablet (20 mg total) by mouth 2 (two) times daily.  ?  finasteride (PROSCAR) 5 MG tablet Take 1 tablet (5 mg total) by mouth daily.  ? Multiple Vitamins-Minerals (CENTRUM SILVER PO) Take by mouth daily at 6 (six) AM.  ? Potassium Chloride (KLOR-CON 10 PO) Take 20 capsules by mouth daily.  ? potassium chloride (KLOR-CON) 10 MEQ tablet Take 3 tablets (30 mEq total) by mouth daily. (Patient taking differently: Take 20 mEq by mouth daily.)  ? simvastatin (ZOCOR) 40 MG tablet Take 1 tablet (40 mg total) by mouth at bedtime.  ? ?No facility-administered encounter medications on file as of 07/06/2021.  ? ? ?Allergies (verified) ?Atorvastatin, Pravastatin sodium, and Rosuvastatin  ? ?History: ?Past Medical History:  ?Diagnosis Date  ? ANXIETY 11/21/2006  ? Cancer ([facility]   ? skin cancer left cheek  ? DEPRESSION 05/31/2008  ? DIABETES MELLITUS, TYPE II 07/22/2007  ? no meds  ? Diarrhea 05/31/2008  ? DVT (deep venous thrombosis) ([place] 02/08/2014  ? HYPERLIPIDEMIA 11/21/2006  ? HYPERTENSION 11/17/2006  ? INSOMNIA-SLEEP DISORDER-UNSPEC 07/21/2007  ? OBESITY, MILD 11/17/2006  ? PEPTIC ULCER DISEASE 07/22/2007  ? PEPTIC ULCER DISEASE, HX OF 11/17/2006  ? Post-operative nausea and vomiting   ? past 2 surgeries  ? SINUSITIS- ACUTE-NOS 12/08/2007  ? SKIN LESION 12/08/2007  ? ?Past Surgical History:  ?Procedure Laterality Date  ? inguinal herniorrhapy    ? bilat  ? s/p incarcerated recurrent ventral hernia    ? s/p meckel's diverticulum    ? ?Family History  ?Problem Relation Age of Onset  ? Heart disease Mother   ? Stroke  Other   ? Cancer Other   ?     prostate  ? Heart disease Father   ? COPD Brother   ? ?Social History  ? ?Socioeconomic History  ? Marital status: Single  ?  Spouse name: Not on file  ? Number of children: Not on file  ? Years of education: Not on file  ? Highest education level: Not on file  ?Occupational History  ? Not on file  ?Tobacco Use  ? Smoking status: Never  ? Smokeless tobacco: Never  ?Vaping Use  ? Vaping Use: Never used  ?Substance and Sexual Activity  ? Alcohol use: No   ? Drug use: No  ? Sexual activity: Not on file  ?Other Topics Concern  ? Not on file  ?Social History Narrative  ? Lived with mother for 40 yrs  ? ?Social Determinants of Health  ? ?Financial Resource Strain: Low Risk   ? Difficulty of Paying Living Expenses: Not hard at all  ?Food Insecurity: No Food Insecurity  ? Worried About Charity fundraiser in the Last Year: Never true  ? Ran Out of Food in the Last Year: Never true  ?Transportation Needs: No Transportation Needs  ? Lack of Transportation (Medical): No  ? Lack of Transportation (Non-Medical): No  ?Physical Activity: Insufficiently Active  ? Days of Exercise per Week: 3 days  ? Minutes of Exercise per Session: 30 min  ?Stress: No Stress Concern Present  ? Feeling of Stress : Not at all  ?Social Connections: Socially Isolated  ? Frequency of Communication with Friends and Family: Twice a week  ? Frequency of Social Gatherings with Friends and Family: Twice a week  ? Attends Religious Services: Never  ? Active Member of Clubs or Organizations: No  ? Attends Archivist Meetings: Never  ? Marital Status: Never married  ? ? ?Tobacco Counseling ?Counseling given: Not Answered ? ? ?Clinical Intake: ? ?Pre-visit preparation completed: Yes ? ?Pain : No/denies pain ? ?  ? ?Nutritional Risks: None ?Diabetes: No ? ?How often do you need to have someone help you when you read instructions, pamphlets, or other written materials from your doctor or pharmacy?: 1 - Never ?What is the last grade level you completed in school?: High School ? ?Diabetic?no  ? ?Interpreter Needed?: No ? ?Information entered by :: [email or web address] ? ? ?Activities of Daily Living ? ?  07/06/2021  ?  8:53 AM  ?In your present state of health, do you have any difficulty performing the following activities:  ?Hearing? 0  ?Vision? 0  ?Difficulty concentrating or making decisions? 0  ?Walking or climbing stairs? 0  ?Dressing or bathing? 0  ?Doing errands, shopping? 0  ?Preparing Food and eating ? N   ?Using the Toilet? N  ?In the past six months, have you accidently leaked urine? N  ?Do you have problems with loss of bowel control? N  ?Managing your Medications? N  ?Managing your Finances? N  ?Housekeeping or managing your Housekeeping? N  ? ? ?Patient Care Team: ?Biagio Borg, MD as PCP - General ? ?Indicate any recent Medical Services you may have received from other than [facility] providers in the past year (date may be approximate). ? ?   ?Assessment:  ? This is a routine wellness examination for [place]. ? ?Hearing/Vision screen ?Vision Screening - Comments:: Annual eye exams wear glasses  ? ?Dietary issues and exercise activities discussed: ?Current Exercise Habits: Home exercise routine, Type of exercise: walking, Time (Minutes):  30, Frequency (Times/Week): 3, Weekly Exercise (Minutes/Week): 90, Intensity: Mild ? ? Goals Addressed   ?None ?  ? ?Depression Screen ? ?  07/06/2021  ?  8:51 AM 07/06/2021  ?  8:48 AM 11/04/2020  ?  3:44 PM 10/23/2019  ?  8:31 AM 10/23/2019  ?  8:14 AM 10/17/2018  ?  8:14 AM 08/12/2017  ?  8:15 AM  ?PHQ 2/9 Scores  ?PHQ - 2 Score 0 0 0 1 0 0 0  ?  ?Fall Risk ? ?  07/06/2021  ?  8:51 AM 11/04/2020  ?  3:44 PM 11/04/2020  ?  2:46 PM 10/23/2019  ?  8:31 AM 10/23/2019  ?  8:10 AM  ?Fall Risk   ?Falls in the past year? 0 0 0 0 0  ?Number falls in past yr: 0 0 0  0  ?Injury with Fall? 0 0 0  0  ?Risk for fall due to :     No Fall Risks  ?Follow up Falls evaluation completed    Falls evaluation completed  ? ? ?FALL RISK PREVENTION PERTAINING TO THE HOME: ? ?Any stairs in or around the home? No  ?If so, are there any without handrails? No  ?Home free of loose throw rugs in walkways, pet beds, electrical cords, etc? Yes  ?Adequate lighting in your home to reduce risk of falls? Yes  ? ?ASSISTIVE DEVICES UTILIZED TO PREVENT FALLS: ? ?Life alert? No  ?Use of a cane, walker or w/c? No  ?Grab bars in the bathroom? Yes  ?Shower chair or bench in shower? No  ?Elevated toilet seat or a handicapped toilet? No   ? ? ? ?Cognitive Function: ? Normal cognitive status assessed by telephone conversation  by this Nurse Health Advisor. No abnormalities found.  ? ?  ?  ? ?Immunizations ?Immunization History  ?Administered D

## 2021-08-13 ENCOUNTER — Ambulatory Visit: Payer: Medicare PPO | Admitting: Orthopedic Surgery

## 2021-08-13 ENCOUNTER — Encounter: Payer: Self-pay | Admitting: Orthopedic Surgery

## 2021-08-13 DIAGNOSIS — M170 Bilateral primary osteoarthritis of knee: Secondary | ICD-10-CM

## 2021-08-13 NOTE — Progress Notes (Signed)
Office Visit Note   Patient: Joel King           Date of Birth: [birth year]           MRN: [id number] Visit Date: 08/13/2021              Requested by: Biagio Borg, MD [address] [place],  [place] [address] PCP: Biagio Borg, MD  Chief Complaint  Patient presents with   Left Knee - Follow-up    S/p cortisone injection 05/11/21   Right Knee - Follow-up      HPI: Patient is a 71 year old gentleman with osteoarthritis bilateral knees.  Patient has had good relief with previous injections.  He states the current injections have worn off.  Assessment & Plan: Visit Diagnoses:  1. Bilateral primary osteoarthritis of knee     Plan: Both knees were injected he tolerated this well continue with compression for the venous insufficiency.  Follow-Up Instructions: Return in about 3 months (around 11/13/2021).   Ortho Exam  Patient is alert, oriented, no adenopathy, well-dressed, normal affect, normal respiratory effort. Examination patient has tenderness to palpation globally around both knees he is wearing knee-high compression stockings he has no open ulcers but does have swelling with venous insufficiency.  Imaging: No results found. No images are attached to the encounter.  Labs: Lab Results  Component Value Date   HGBA1C 5.6 10/27/2020   HGBA1C 5.7 04/22/2020   HGBA1C 5.4 10/15/2019     Lab Results  Component Value Date   ALBUMIN 4.3 10/27/2020   ALBUMIN 4.6 04/22/2020   ALBUMIN 4.6 10/09/2018    Lab Results  Component Value Date   MG 1.9 10/27/2020   MG 1.8 04/22/2020   Lab Results  Component Value Date   VD25OH 35.48 10/27/2020   VD25OH 21 (L) 10/15/2019    No results found for: PREALBUMIN    Latest Ref Rng & Units 10/27/2020   12:20 PM 10/15/2019    9:58 AM 10/09/2018    7:30 AM  CBC EXTENDED  WBC 4.0 - 10.5 K/uL 8.0   9.1   9.7    RBC 4.22 - 5.81 Mil/uL 4.74   5.07   5.10    Hemoglobin 13.0 - 17.0 g/dL 15.4   16.5   16.4    HCT 39.0 -  52.0 % 43.8   47.3   47.4    Platelets 150.0 - 400.0 K/uL 196.0   217   208.0    NEUT# 1.4 - 7.7 K/uL 6.2   6,871   7.2    Lymph# 0.7 - 4.0 K/uL 0.9   983   1.3       There is no height or weight on file to calculate BMI.  Orders:  No orders of the defined types were placed in this encounter.  No orders of the defined types were placed in this encounter.    Procedures: Large Joint Inj: bilateral knee on 08/13/2021 5:41 PM Indications: pain and diagnostic evaluation Details: 22 G 1.5 in needle, anteromedial approach  Arthrogram: No  Outcome: tolerated well, no immediate complications Procedure, treatment alternatives, risks and benefits explained, specific risks discussed. Consent was given by the patient. Immediately prior to procedure a time out was called to verify the correct patient, procedure, equipment, support staff and site/side marked as required. Patient was prepped and draped in the usual sterile fashion.     Clinical Data: No additional findings.  ROS:  All other systems negative,  except as noted in the HPI. Review of Systems  Objective: Vital Signs: There were no vitals taken for this visit.  Specialty Comments:  No specialty comments available.  PMFS History: Patient Active Problem List   Diagnosis Date Noted   Wheezing 07/15/2020   Vitamin D deficiency 10/23/2019   UTI (urinary tract infection) 10/23/2019   Vertigo 02/06/2019   Cough 03/15/2018   BPH (benign prostatic hyperplasia) 08/13/2017   Effusion, right knee 12/13/2016   Bilateral primary osteoarthritis of knee 08/24/2016   Plantar fasciitis 03/16/2016   Achilles tendinitis, right leg 03/16/2016   Rash 01/15/2016   Stasis dermatitis 01/15/2016   Chronic venous insufficiency 12/30/2015   Cellulitis 12/30/2015   Itching 12/09/2015   Dizziness 03/25/2015   Skin lesion of face 03/25/2015   Abnormal liver function 03/25/2015   Hypokalemia 03/25/2015   Viral upper respiratory tract  infection 01/14/2015   Neuritis of right lower extremity 08/20/2014   DVT (deep venous thrombosis) ([place]) 02/08/2014   Right knee pain 12/22/2013   Bronchitis 02/28/2012   Fatigue 11/02/2010   Encounter for well adult exam with abnormal findings 11/01/2010   DEPRESSION 05/31/2008   Diarrhea 05/31/2008   SKIN LESION 12/08/2007   Pre-diabetes 07/22/2007   PEPTIC ULCER DISEASE 07/22/2007   INSOMNIA-SLEEP DISORDER-UNSPEC 07/21/2007   Hyperlipidemia 11/21/2006   Anxiety state 11/21/2006   OBESITY, MILD 11/17/2006   Essential hypertension 11/17/2006   Past Medical History:  Diagnosis Date   ANXIETY 11/21/2006   Cancer ([place])    skin cancer left cheek   DEPRESSION 05/31/2008   DIABETES MELLITUS, TYPE II 07/22/2007   no meds   Diarrhea 05/31/2008   DVT (deep venous thrombosis) ([place]) 02/08/2014   HYPERLIPIDEMIA 11/21/2006   HYPERTENSION 11/17/2006   INSOMNIA-SLEEP DISORDER-UNSPEC 07/21/2007   OBESITY, MILD 11/17/2006   PEPTIC ULCER DISEASE 07/22/2007   PEPTIC ULCER DISEASE, HX OF 11/17/2006   Post-operative nausea and vomiting    past 2 surgeries   SINUSITIS- ACUTE-NOS 12/08/2007   SKIN LESION 12/08/2007    Family History  Problem Relation Age of Onset   Heart disease Mother    Stroke Other    Cancer Other        prostate   Heart disease Father    COPD Brother     Past Surgical History:  Procedure Laterality Date   inguinal herniorrhapy     bilat   s/p incarcerated recurrent ventral hernia     s/p meckel's diverticulum     Social History   Occupational History   Not on file  Tobacco Use   Smoking status: Never   Smokeless tobacco: Never  Vaping Use   Vaping Use: Never used  Substance and Sexual Activity   Alcohol use: No   Drug use: No   Sexual activity: Not on file

## 2021-08-25 ENCOUNTER — Ambulatory Visit: Payer: Medicare PPO | Admitting: Orthopedic Surgery

## 2021-09-23 ENCOUNTER — Other Ambulatory Visit: Payer: Self-pay | Admitting: Internal Medicine

## 2021-09-23 NOTE — Telephone Encounter (Signed)
Please refill as per office routine med refill policy (all routine meds to be refilled for 3 mo or monthly (per pt preference) up to one year from last visit, then month to month grace period for 3 mo, then further med refills will have to be denied) ? ?

## 2021-10-15 ENCOUNTER — Encounter: Payer: Self-pay | Admitting: Podiatry

## 2021-10-15 ENCOUNTER — Ambulatory Visit (INDEPENDENT_AMBULATORY_CARE_PROVIDER_SITE_OTHER): Payer: Medicare PPO | Admitting: Podiatry

## 2021-10-15 ENCOUNTER — Ambulatory Visit (INDEPENDENT_AMBULATORY_CARE_PROVIDER_SITE_OTHER): Payer: Medicare PPO

## 2021-10-15 DIAGNOSIS — M7671 Peroneal tendinitis, right leg: Secondary | ICD-10-CM

## 2021-10-15 MED ORDER — TRIAMCINOLONE ACETONIDE 10 MG/ML IJ SUSP
10.0000 mg | Freq: Once | INTRAMUSCULAR | Status: AC
  Administered 2021-10-15: 10 mg

## 2021-10-15 NOTE — Progress Notes (Signed)
Subjective:   Patient ID: Joel King, male   DOB: 71 y.o.   MRN: [id number]   HPI Patient states he developed a lot of pain in the outside of his right foot and its been sharp and constant and did well at last visit but started back in the last 2 weeks   ROS      Objective:  Physical Exam  Neurovascular status intact with inflammation of the peroneal tendon at insertion into the base of the fifth metatarsal     Assessment:  Peroneal tendinitis right with inflammation     Plan:  Standard prep injected the insertion after explaining risk to patient 3 mg Dexasone Kenalog 5 mg Xylocaine advised on ice reappoint to recheck as needed

## 2021-10-28 ENCOUNTER — Other Ambulatory Visit (INDEPENDENT_AMBULATORY_CARE_PROVIDER_SITE_OTHER): Payer: Medicare PPO

## 2021-10-28 DIAGNOSIS — Z0001 Encounter for general adult medical examination with abnormal findings: Secondary | ICD-10-CM

## 2021-10-28 DIAGNOSIS — E559 Vitamin D deficiency, unspecified: Secondary | ICD-10-CM

## 2021-10-28 DIAGNOSIS — R7303 Prediabetes: Secondary | ICD-10-CM | POA: Diagnosis not present

## 2021-10-28 DIAGNOSIS — E538 Deficiency of other specified B group vitamins: Secondary | ICD-10-CM | POA: Diagnosis not present

## 2021-10-28 DIAGNOSIS — E7849 Other hyperlipidemia: Secondary | ICD-10-CM

## 2021-10-28 LAB — HEPATIC FUNCTION PANEL
ALT: 16 U/L (ref 0–53)
AST: 19 U/L (ref 0–37)
Albumin: 4.6 g/dL (ref 3.5–5.2)
Alkaline Phosphatase: 73 U/L (ref 39–117)
Bilirubin, Direct: 0.2 mg/dL (ref 0.0–0.3)
Total Bilirubin: 0.7 mg/dL (ref 0.2–1.2)
Total Protein: 7.2 g/dL (ref 6.0–8.3)

## 2021-10-28 LAB — LIPID PANEL
Cholesterol: 130 mg/dL (ref 0–200)
HDL: 31.2 mg/dL — ABNORMAL LOW (ref >39.00)
LDL Cholesterol: 63 mg/dL (ref 0–99)
NonHDL: 98.95
Total CHOL/HDL Ratio: 4
Triglycerides: 178 mg/dL — ABNORMAL HIGH (ref 0.0–149.0)
VLDL: 35.6 mg/dL (ref 0.0–40.0)

## 2021-10-28 LAB — BASIC METABOLIC PANEL
BUN: 21 mg/dL (ref 6–23)
CO2: 25 mEq/L (ref 19–32)
Calcium: 9.6 mg/dL (ref 8.4–10.5)
Chloride: 100 mEq/L (ref 96–112)
Creatinine, Ser: 0.85 mg/dL (ref 0.40–1.50)
GFR: 87.51 mL/min (ref >60.00)
Glucose, Bld: 102 mg/dL — ABNORMAL HIGH (ref 70–99)
Potassium: 3.8 mEq/L (ref 3.5–5.1)
Sodium: 138 mEq/L (ref 135–145)

## 2021-10-28 LAB — CBC WITH DIFFERENTIAL/PLATELET
Basophils Absolute: 0 10*3/uL (ref 0.0–0.1)
Basophils Relative: 0.3 % (ref 0.0–3.0)
Eosinophils Absolute: 0.3 10*3/uL (ref 0.0–0.7)
Eosinophils Relative: 2.7 % (ref 0.0–5.0)
HCT: 44.8 % (ref 39.0–52.0)
Hemoglobin: 15.6 g/dL (ref 13.0–17.0)
Lymphocytes Relative: 9.2 % — ABNORMAL LOW (ref 12.0–46.0)
Lymphs Abs: 1 10*3/uL (ref 0.7–4.0)
MCHC: 34.9 g/dL (ref 30.0–36.0)
MCV: 93.5 fl (ref 78.0–100.0)
Monocytes Absolute: 0.8 10*3/uL (ref 0.1–1.0)
Monocytes Relative: 7.3 % (ref 3.0–12.0)
Neutro Abs: 9 10*3/uL — ABNORMAL HIGH (ref 1.4–7.7)
Neutrophils Relative %: 80.5 % — ABNORMAL HIGH (ref 43.0–77.0)
Platelets: 201 10*3/uL (ref 150.0–400.0)
RBC: 4.79 Mil/uL (ref 4.22–5.81)
RDW: 12.7 % (ref 11.5–15.5)
WBC: 11.2 10*3/uL — ABNORMAL HIGH (ref 4.0–10.5)

## 2021-10-28 LAB — URINALYSIS, ROUTINE W REFLEX MICROSCOPIC
Bilirubin Urine: NEGATIVE
Hgb urine dipstick: NEGATIVE
Leukocytes,Ua: NEGATIVE
Nitrite: NEGATIVE
RBC / HPF: NONE SEEN (ref 0–?)
Specific Gravity, Urine: 1.02 (ref 1.000–1.030)
Total Protein, Urine: 30 — AB
Urine Glucose: NEGATIVE
Urobilinogen, UA: 1 (ref 0.0–1.0)
pH: 7 (ref 5.0–8.0)

## 2021-10-28 LAB — VITAMIN B12: Vitamin B-12: 439 pg/mL (ref 211–911)

## 2021-10-28 LAB — HEMOGLOBIN A1C: Hgb A1c MFr Bld: 5.8 % (ref 4.6–6.5)

## 2021-10-28 LAB — TSH: TSH: 1.15 u[IU]/mL (ref 0.35–5.50)

## 2021-10-28 LAB — PSA: PSA: 1.88 ng/mL (ref 0.10–4.00)

## 2021-10-28 LAB — VITAMIN D 25 HYDROXY (VIT D DEFICIENCY, FRACTURES): VITD: 33.31 ng/mL (ref 30.00–100.00)

## 2021-11-06 ENCOUNTER — Encounter: Payer: Self-pay | Admitting: Internal Medicine

## 2021-11-06 ENCOUNTER — Ambulatory Visit (INDEPENDENT_AMBULATORY_CARE_PROVIDER_SITE_OTHER): Payer: Medicare PPO | Admitting: Internal Medicine

## 2021-11-06 VITALS — BP 134/68 | HR 55 | Temp 99.0°F | Ht 67.0 in | Wt 237.4 lb

## 2021-11-06 DIAGNOSIS — Z0001 Encounter for general adult medical examination with abnormal findings: Secondary | ICD-10-CM | POA: Diagnosis not present

## 2021-11-06 DIAGNOSIS — E538 Deficiency of other specified B group vitamins: Secondary | ICD-10-CM

## 2021-11-06 DIAGNOSIS — Z125 Encounter for screening for malignant neoplasm of prostate: Secondary | ICD-10-CM

## 2021-11-06 DIAGNOSIS — E7849 Other hyperlipidemia: Secondary | ICD-10-CM

## 2021-11-06 DIAGNOSIS — R7303 Prediabetes: Secondary | ICD-10-CM | POA: Diagnosis not present

## 2021-11-06 DIAGNOSIS — I10 Essential (primary) hypertension: Secondary | ICD-10-CM

## 2021-11-06 DIAGNOSIS — E559 Vitamin D deficiency, unspecified: Secondary | ICD-10-CM

## 2021-11-06 MED ORDER — FINASTERIDE 5 MG PO TABS: 5.0000 mg | ORAL_TABLET | Freq: Every day | ORAL | 3 refills | Status: DC

## 2021-11-06 MED ORDER — AMLODIPINE BESYLATE 10 MG PO TABS: 10.0000 mg | ORAL_TABLET | Freq: Every day | ORAL | 3 refills | Status: DC

## 2021-11-06 MED ORDER — ATENOLOL-CHLORTHALIDONE 100-25 MG PO TABS
1.0000 | ORAL_TABLET | Freq: Every day | ORAL | 3 refills | Status: DC
Start: 2021-11-06 — End: 2022-11-08

## 2021-11-06 MED ORDER — FAMOTIDINE 20 MG PO TABS: 20.0000 mg | ORAL_TABLET | Freq: Two times a day (BID) | ORAL | 3 refills | Status: DC

## 2021-11-06 MED ORDER — BENAZEPRIL HCL 40 MG PO TABS: 40.0000 mg | ORAL_TABLET | Freq: Every day | ORAL | 3 refills | Status: DC

## 2021-11-06 MED ORDER — SIMVASTATIN 40 MG PO TABS
40.0000 mg | ORAL_TABLET | Freq: Every day | ORAL | 3 refills | Status: DC
Start: 2021-11-06 — End: 2021-12-07

## 2021-11-06 MED ORDER — POTASSIUM CHLORIDE ER 10 MEQ PO CPCR: 10.0000 meq | ORAL_CAPSULE | Freq: Two times a day (BID) | ORAL | 3 refills | Status: DC

## 2021-11-06 NOTE — Patient Instructions (Addendum)
Please have your Shingrix (shingles) shots done at your local pharmacy.  Please take OTC Vitamin D3 at 2000 units per day, indefinitely  Please continue all other medications as before, and refills have been done if requested.  Please have the pharmacy call with any other refills you may need.  Please continue your efforts at being more active, low cholesterol diet, and weight control.  You are otherwise up to date with prevention measures today.  Please keep your appointments with your specialists as you may have planned  Please make an Appointment to return for your 1 year visit, or sooner if needed, with Lab testing by Appointment as well, to be done about 3-5 days before at the [facility] (so this is for TWO appointments - please see the scheduling desk as you leave)   Due to the ongoing Covid 19 pandemic, our lab now requires an appointment for any labs done at our office.  If you need labs done and do not have an appointment, please call our office ahead of time to schedule before presenting to the lab for your testing.

## 2021-11-06 NOTE — Progress Notes (Unsigned)
Patient ID: Joel King, male   DOB: 13-Aug-1950, 71 y.o.   MRN: [id number]         Chief Complaint:: wellness exam and htn, hld, hyperglycemia, low vit d       HPI:  Joel King is a 71 y.o. male here for wellness exam; pt will have shingrix at the local pharmacy, o/w up to date                        Also working part time at [employer] and has bilateral knee pain chornic controlled with cortison every 3-4 mo per DrDuda.  Pt denies chest pain, increased sob or doe, wheezing, orthopnea, PND, increased LE swelling, palpitations, dizziness or syncope.   Pt denies polydipsia, polyuria, or new focal neuro s/s.    Pt denies fever, wt loss, night sweats, loss of appetite, or other constitutional symptoms     Wt Readings from Last 3 Encounters:  11/06/21 237 lb 6.4 oz (107.7 kg)  02/23/21 223 lb 2 oz (101.2 kg)  11/10/20 235 lb (106.6 kg)   BP Readings from Last 3 Encounters:  11/06/21 134/68  02/23/21 108/64  11/04/20 136/70   Immunization History  Administered Date(s) Administered   DTP 09/05/1997   Fluad Quad(high Dose 65+) 11/11/2018   Influenza Split 12/20/2012   Influenza, High Dose Seasonal PF 12/11/2020   Influenza,inj,Quad PF,6+ Mos 11/29/2013   Influenza-Unspecified 11/20/2015, 11/28/2016, 11/26/2018   PFIZER(Purple Top)SARS-COV-2 Vaccination 04/22/2019, 05/14/2019, 12/21/2019, 07/18/2020, 12/06/2020   Pneumococcal Conjugate-13 09/27/2015   Pneumococcal Polysaccharide-23 03/30/1995, 07/29/2008, 12/09/2015, 11/28/2016   Td 07/29/2008   Tdap 11/21/2014   Zoster, Live 11/21/2014   Health Maintenance Due  Topic Date Due   Diabetic kidney evaluation - Urine ACR  10/27/2021      Past Medical History:  Diagnosis Date   ANXIETY 11/21/2006   Cancer ([place])    skin cancer left cheek   DEPRESSION 05/31/2008   DIABETES MELLITUS, TYPE II 07/22/2007   no meds   Diarrhea 05/31/2008   DVT (deep venous thrombosis) ([place]) 02/08/2014   HYPERLIPIDEMIA 11/21/2006    HYPERTENSION 11/17/2006   INSOMNIA-SLEEP DISORDER-UNSPEC 07/21/2007   OBESITY, MILD 11/17/2006   PEPTIC ULCER DISEASE 07/22/2007   PEPTIC ULCER DISEASE, HX OF 11/17/2006   Post-operative nausea and vomiting    past 2 surgeries   SINUSITIS- ACUTE-NOS 12/08/2007   SKIN LESION 12/08/2007   Past Surgical History:  Procedure Laterality Date   inguinal herniorrhapy     bilat   s/p incarcerated recurrent ventral hernia     s/p meckel's diverticulum      reports that he has never smoked. He has never used smokeless tobacco. He reports that he does not drink alcohol and does not use drugs. family history includes COPD in his brother; Cancer in an other family member; Heart disease in his father and mother; Stroke in an other family member. Allergies  Allergen Reactions   Atorvastatin     Unsure of results   Pravastatin Sodium     Unsure of reaction   Rosuvastatin     Unsure of reaction   Current Outpatient Medications on File Prior to Visit  Medication Sig Dispense Refill   aspirin 81 MG tablet Take 81 mg by mouth daily.     Multiple Vitamins-Minerals (CENTRUM SILVER PO) Take by mouth daily at 6 (six) AM.     No current facility-administered medications on file prior to visit.  ROS:  All others reviewed and negative.  Objective        PE:  BP 134/68 (BP Location: Right Arm, Patient Position: Sitting, Cuff Size: Large)   Pulse (!) 55   Temp 99 F (37.2 C) (Oral)   Ht '5\' 7"'$  (1.702 m)   Wt 237 lb 6.4 oz (107.7 kg)   SpO2 98%   BMI 37.18 kg/m                 Constitutional: Pt appears in NAD               HENT: Head: NCAT.                Right Ear: External ear normal.                 Left Ear: External ear normal.                Eyes: . Pupils are equal, round, and reactive to light. Conjunctivae and EOM are normal               Nose: without d/c or deformity               Neck: Neck supple. Gross normal ROM               Cardiovascular: Normal rate and regular rhythm.                  Pulmonary/Chest: Effort normal and breath sounds without rales or wheezing.                Abd:  Soft, NT, ND, + BS, no organomegaly               Neurological: Pt is alert. At baseline orientation, motor grossly intact               Skin: Skin is warm. No rashes, no other new lesions, LE edema - none               Psychiatric: Pt behavior is normal without agitation , mild chronic nervous  Micro: none  Cardiac tracings I have personally interpreted today:  none  Pertinent Radiological findings (summarize): none   Lab Results  Component Value Date   WBC 11.2 (H) 10/28/2021   HGB 15.6 10/28/2021   HCT 44.8 10/28/2021   PLT 201.0 10/28/2021   GLUCOSE 102 (H) 10/28/2021   CHOL 130 10/28/2021   TRIG 178.0 (H) 10/28/2021   HDL 31.20 (L) 10/28/2021   LDLDIRECT 106.0 08/05/2016   LDLCALC 63 10/28/2021   ALT 16 10/28/2021   AST 19 10/28/2021   NA 138 10/28/2021   K 3.8 10/28/2021   CL 100 10/28/2021   CREATININE 0.85 10/28/2021   BUN 21 10/28/2021   CO2 25 10/28/2021   TSH 1.15 10/28/2021   PSA 1.88 10/28/2021   HGBA1C 5.8 10/28/2021   MICROALBUR 8.1 (H) 10/27/2020   Assessment/Plan:  Joel King is a 71 y.o. White or Caucasian [1] male with  has a past medical history of ANXIETY (11/21/2006), Cancer [facility]), DEPRESSION (05/31/2008), DIABETES MELLITUS, TYPE II (07/22/2007), Diarrhea (05/31/2008), DVT (deep venous thrombosis) ([place]) (02/08/2014), HYPERLIPIDEMIA (11/21/2006), HYPERTENSION (11/17/2006), INSOMNIA-SLEEP DISORDER-UNSPEC (07/21/2007), OBESITY, MILD (11/17/2006), PEPTIC ULCER DISEASE (07/22/2007), PEPTIC ULCER DISEASE, HX OF (11/17/2006), Post-operative nausea and vomiting, SINUSITIS- ACUTE-NOS (12/08/2007), and SKIN LESION (12/08/2007).  Encounter for well adult exam with abnormal findings Age and sex appropriate education and counseling updated with regular exercise  and diet Referrals for preventative services - none needed Immunizations addressed - for shingrix  at the local pharmacy Smoking counseling  - none needed Evidence for depression or other mood disorder - chronic anxiety stable Most recent labs reviewed. I have personally reviewed and have noted: 1) the patient's medical and social history 2) The patient's current medications and supplements 3) The patient's height, weight, and BMI have been recorded in the chart   Pre-diabetes Lab Results  Component Value Date   HGBA1C 5.8 10/28/2021   Stable, pt to continue current medical treatment  - diet , wt control, excercise   Hyperlipidemia Lab Results  Component Value Date   LDLCALC 63 10/28/2021   Stable, pt to continue current statin zocr 40 mg qd   Essential hypertension BP Readings from Last 3 Encounters:  11/06/21 134/68  02/23/21 108/64  11/04/20 136/70   Stable, pt to continue medical treatment norvasc 10 qd, tenoretic 100-25 mg qd, lotensin 40 mg qd   Vitamin D deficiency Last vitamin D Lab Results  Component Value Date   VD25OH 33.31 10/28/2021   Low, to start oral replacement  Followup: Return in about 1 year (around 11/07/2022).  Cathlean Cower, MD 11/08/2021 7:42 PM [facility]

## 2021-11-08 ENCOUNTER — Encounter: Payer: Self-pay | Admitting: Internal Medicine

## 2021-11-08 NOTE — Assessment & Plan Note (Signed)
Lab Results  Component Value Date   HGBA1C 5.8 10/28/2021   Stable, pt to continue current medical treatment  - diet , wt control, excercise

## 2021-11-08 NOTE — Assessment & Plan Note (Signed)
Last vitamin D Lab Results  Component Value Date   VD25OH 33.31 10/28/2021   Low, to start oral replacement

## 2021-11-08 NOTE — Assessment & Plan Note (Signed)
BP Readings from Last 3 Encounters:  11/06/21 134/68  02/23/21 108/64  11/04/20 136/70   Stable, pt to continue medical treatment norvasc 10 qd, tenoretic 100-25 mg qd, lotensin 40 mg qd

## 2021-11-08 NOTE — Assessment & Plan Note (Signed)
Age and sex appropriate education and counseling updated with regular exercise and diet Referrals for preventative services - none needed Immunizations addressed - for shingrix at the local pharmacy Smoking counseling  - none needed Evidence for depression or other mood disorder - chronic anxiety stable Most recent labs reviewed. I have personally reviewed and have noted: 1) the patient's medical and social history 2) The patient's current medications and supplements 3) The patient's height, weight, and BMI have been recorded in the chart

## 2021-11-08 NOTE — Assessment & Plan Note (Signed)
Lab Results  Component Value Date   LDLCALC 63 10/28/2021   Stable, pt to continue current statin zocr 40 mg qd

## 2021-11-09 ENCOUNTER — Telehealth: Payer: Self-pay | Admitting: Internal Medicine

## 2021-11-09 NOTE — Telephone Encounter (Signed)
PT visits today with at home BP readings. Recordings have been left in Dr.John's mailbox.

## 2021-11-12 ENCOUNTER — Ambulatory Visit: Payer: Medicare PPO | Admitting: Orthopedic Surgery

## 2021-11-12 DIAGNOSIS — M170 Bilateral primary osteoarthritis of knee: Secondary | ICD-10-CM | POA: Diagnosis not present

## 2021-11-12 NOTE — Telephone Encounter (Signed)
After checking his mailbox, I did not see them in there, I will ask Dr.John.

## 2021-12-06 ENCOUNTER — Other Ambulatory Visit: Payer: Self-pay | Admitting: Internal Medicine

## 2021-12-06 NOTE — Telephone Encounter (Signed)
Please refill as per office routine med refill policy (all routine meds to be refilled for 3 mo or monthly (per pt preference) up to one year from last visit, then month to month grace period for 3 mo, then further med refills will have to be denied) ? ?

## 2021-12-10 ENCOUNTER — Ambulatory Visit (INDEPENDENT_AMBULATORY_CARE_PROVIDER_SITE_OTHER): Payer: Medicare PPO | Admitting: Family Medicine

## 2021-12-10 ENCOUNTER — Encounter: Payer: Self-pay | Admitting: Family Medicine

## 2021-12-10 VITALS — BP 126/68 | HR 65 | Temp 97.8°F | Ht 67.0 in | Wt 230.0 lb

## 2021-12-10 DIAGNOSIS — R051 Acute cough: Secondary | ICD-10-CM

## 2021-12-10 DIAGNOSIS — J069 Acute upper respiratory infection, unspecified: Secondary | ICD-10-CM

## 2021-12-10 DIAGNOSIS — U071 COVID-19: Secondary | ICD-10-CM

## 2021-12-10 LAB — POC COVID19 BINAXNOW: SARS Coronavirus 2 Ag: POSITIVE — AB

## 2021-12-10 MED ORDER — NIRMATRELVIR/RITONAVIR (PAXLOVID)TABLET: 3.0000 | ORAL_TABLET | Freq: Two times a day (BID) | ORAL | 0 refills | Status: AC

## 2021-12-10 MED ORDER — NIRMATRELVIR/RITONAVIR (PAXLOVID)TABLET: 3.0000 | ORAL_TABLET | Freq: Two times a day (BID) | ORAL | 0 refills | Status: DC

## 2021-12-10 MED ORDER — HYDROCODONE BIT-HOMATROP MBR 5-1.5 MG/5ML PO SOLN: 5.0000 mL | Freq: Three times a day (TID) | ORAL | 0 refills | Status: DC | PRN

## 2021-12-10 NOTE — Addendum Note (Signed)
Addended by: Rossie Muskrat on: 12/10/2021 03:16 PM   Modules accepted: Orders

## 2021-12-10 NOTE — Progress Notes (Signed)
Subjective:  Joel King is a 71 y.o. male who presents for 2 day hx of cough and rhinorrhea, and nasal congestion.   Denies fever, chills, dizziness, headache, chest pain, palpitations, shortness of breath, abdominal pain, N/V/D,  LE edema.   Taking Dayquil.   Does not smoke.   No other aggravating or relieving factors.  No other c/o.  ROS as in subjective.   Objective: Vitals:   12/10/21 1344  BP: 126/68  Pulse: 65  Temp: 97.8 F (36.6 C)  SpO2: 98%   Alert and oriented in no acute distress.  Respirations unlabored.  Speaking in complete sentences without difficulty.       Assessment: COVID-19 virus infection - Plan: nirmatrelvir/ritonavir EUA (PAXLOVID) 20 x 150 MG & 10 x '100MG'$  TABS  Acute URI  Acute cough - Plan: POC COVID-19, HYDROcodone bit-homatropine (HYCODAN) 5-1.5 MG/5ML syrup   Plan: Discussed diagnosis and treatment of COVID infection.  Paxlovid prescribed.  Discussed how to take the medication and potential side effects.  Hycodan prescribed per patient request.  He is aware that the medication is sedating.  Suggested symptomatic OTC remedies. Nasal saline spray for congestion.  Tylenol or Ibuprofen OTC for fever and malaise.   Counseling on CDC guidelines for quarantine and isolation.

## 2021-12-10 NOTE — Patient Instructions (Addendum)
You are Covid positive.   I prescribed Paxlovid for you to take for the next 5 days. You should stop taking simvastatin for the next week while taking Paxlovid.   Treat your symptoms and I have recommendations below.   You should stay at home and quarantine until Monday September 18th. You should wear a mask for an additional 5 days (until Saturday September 23rd.                             Specific home care recommendations today include: Decongestant: You may use OTC Guaifenesin (Mucinex plain) for congestion.  You may use Pseudoephedrine (Sudafed) only if you don't have blood pressure problems or a diagnosis of hypertension. Cough suppression: If you have cough from drainage, you may use over-the-counter Dextromethorphan (Delsym) as directed on the label Sore throat remedies:  You may use salt water gargles, warm fluids such as coffee or hot tea, or honey/tea/lemon mixture to sooth sore throat pain.  You may use OTC sore throat remedies such as Cepacol lozenges or Chloraseptic spray for sore throat pain. Runny nose and sneezing remedies: You may use OTC antihistamine such as Zyrtec or Benadryl, but caution as these can cause drowsiness.   Pain/fever relief: You may use over-the-counter Tylenol for pain or fever Drink extra fluids. Fluids help thin the mucus so your sinuses can drain more easily.  Applying either moist heat or ice packs to the sinus areas may help relieve discomfort. Use saline nasal sprays to help moisten your sinuses. The sprays can be found at your local drugstore.

## 2021-12-14 ENCOUNTER — Encounter: Payer: Self-pay | Admitting: Orthopedic Surgery

## 2021-12-14 DIAGNOSIS — M170 Bilateral primary osteoarthritis of knee: Secondary | ICD-10-CM

## 2021-12-14 MED ORDER — METHYLPREDNISOLONE ACETATE 40 MG/ML IJ SUSP
40.0000 mg | INTRAMUSCULAR | Status: AC | PRN
  Administered 2021-12-14: 40 mg via INTRA_ARTICULAR

## 2021-12-14 MED ORDER — LIDOCAINE HCL 1 % IJ SOLN
5.0000 mL | INTRAMUSCULAR | Status: AC | PRN
  Administered 2021-12-14: 5 mL

## 2021-12-14 NOTE — Progress Notes (Addendum)
Office Visit Note   Patient: Joel King           Date of Birth: 09/16/1950           MRN: [id number] Visit Date: 11/12/2021              Requested by: Biagio Borg, MD [address] [place],   [address] PCP: Biagio Borg, MD  Chief Complaint  Patient presents with   Right Knee - Follow-up   Left Knee - Follow-up      HPI: Patient is a 71 year old gentleman who presents in follow-up for osteoarthritis both knees.  Patient is status post steroid injection in both knees 3 months ago.  He states he is having increasing pain in the right knee the left knee appears stable.  Patient is also wearing compression socks for venous insufficiency.  Assessment & Plan: Visit Diagnoses:  1. Bilateral primary osteoarthritis of knee     Plan: Continue with compression socks follow-up as needed for his knees.  Follow-Up Instructions: Return if symptoms worsen or fail to improve.   Ortho Exam  Patient is alert, oriented, no adenopathy, well-dressed, normal affect, normal respiratory effort. Examination there is mild effusion in both knees collaterals cruciates are stable there is no redness or cellulitis.  Imaging: No results found. No images are attached to the encounter.  Labs: Lab Results  Component Value Date   HGBA1C 5.8 10/28/2021   HGBA1C 5.6 10/27/2020   HGBA1C 5.7 04/22/2020     Lab Results  Component Value Date   ALBUMIN 4.6 10/28/2021   ALBUMIN 4.3 10/27/2020   ALBUMIN 4.6 04/22/2020    Lab Results  Component Value Date   MG 1.9 10/27/2020   MG 1.8 04/22/2020   Lab Results  Component Value Date   VD25OH 33.31 10/28/2021   VD25OH 35.48 10/27/2020   VD25OH 21 (L) 10/15/2019    No results found for: "PREALBUMIN"    Latest Ref Rng & Units 10/28/2021   11:22 AM 10/27/2020   12:20 PM 10/15/2019    9:58 AM  CBC EXTENDED  WBC 4.0 - 10.5 K/uL 11.2  8.0  9.1   RBC 4.22 - 5.81 Mil/uL 4.79  4.74  5.07   Hemoglobin 13.0 - 17.0 g/dL 15.6  15.4   16.5   HCT 39.0 - 52.0 % 44.8  43.8  47.3   Platelets 150.0 - 400.0 K/uL 201.0  196.0  217   NEUT# 1.4 - 7.7 K/uL 9.0  6.2  6,871   Lymph# 0.7 - 4.0 K/uL 1.0  0.9  983      There is no height or weight on file to calculate BMI.  Orders:  No orders of the defined types were placed in this encounter.  No orders of the defined types were placed in this encounter.    Procedures: Large Joint Inj: bilateral knee on 12/14/2021 9:48 AM Indications: pain and diagnostic evaluation Details: 22 G 1.5 in needle, anteromedial approach  Arthrogram: No  Medications (Right): 5 mL lidocaine 1 %; 40 mg methylPREDNISolone acetate 40 MG/ML Medications (Left): 5 mL lidocaine 1 %; 40 mg methylPREDNISolone acetate 40 MG/ML Outcome: tolerated well, no immediate complications Procedure, treatment alternatives, risks and benefits explained, specific risks discussed. Consent was given by the patient. Immediately prior to procedure a time out was called to verify the correct patient, procedure, equipment, support staff and site/side marked as required. Patient was prepped and draped in the usual sterile fashion.  Clinical Data: No additional findings.  ROS:  All other systems negative, except as noted in the HPI. Review of Systems  Objective: Vital Signs: There were no vitals taken for this visit.  Specialty Comments:  No specialty comments available.  PMFS History: Patient Active Problem List   Diagnosis Date Noted   Wheezing 07/15/2020   Vitamin D deficiency 10/23/2019   UTI (urinary tract infection) 10/23/2019   Vertigo 02/06/2019   Cough 03/15/2018   BPH (benign prostatic hyperplasia) 08/13/2017   Effusion, right knee 12/13/2016   Bilateral primary osteoarthritis of knee 08/24/2016   Plantar fasciitis 03/16/2016   Achilles tendinitis, right leg 03/16/2016   Rash 01/15/2016   Stasis dermatitis 01/15/2016   Chronic venous insufficiency 12/30/2015   Cellulitis 12/30/2015    Itching 12/09/2015   Dizziness 03/25/2015   Skin lesion of face 03/25/2015   Abnormal liver function 03/25/2015   Hypokalemia 03/25/2015   Viral upper respiratory tract infection 01/14/2015   Neuritis of right lower extremity 08/20/2014   DVT (deep venous thrombosis) ([place]) 02/08/2014   Right knee pain 12/22/2013   Bronchitis 02/28/2012   Fatigue 11/02/2010   Encounter for well adult exam with abnormal findings 11/01/2010   DEPRESSION 05/31/2008   Diarrhea 05/31/2008   SKIN LESION 12/08/2007   Pre-diabetes 07/22/2007   PEPTIC ULCER DISEASE 07/22/2007   INSOMNIA-SLEEP DISORDER-UNSPEC 07/21/2007   Hyperlipidemia 11/21/2006   Anxiety state 11/21/2006   OBESITY, MILD 11/17/2006   Essential hypertension 11/17/2006   Past Medical History:  Diagnosis Date   ANXIETY 11/21/2006   Cancer ([place])    skin cancer left cheek   DEPRESSION 05/31/2008   DIABETES MELLITUS, TYPE II 07/22/2007   no meds   Diarrhea 05/31/2008   DVT (deep venous thrombosis) ([place]) 02/08/2014   HYPERLIPIDEMIA 11/21/2006   HYPERTENSION 11/17/2006   INSOMNIA-SLEEP DISORDER-UNSPEC 07/21/2007   OBESITY, MILD 11/17/2006   PEPTIC ULCER DISEASE 07/22/2007   PEPTIC ULCER DISEASE, HX OF 11/17/2006   Post-operative nausea and vomiting    past 2 surgeries   SINUSITIS- ACUTE-NOS 12/08/2007   SKIN LESION 12/08/2007    Family History  Problem Relation Age of Onset   Heart disease Mother    Stroke Other    Cancer Other        prostate   Heart disease Father    COPD Brother     Past Surgical History:  Procedure Laterality Date   inguinal herniorrhapy     bilat   s/p incarcerated recurrent ventral hernia     s/p meckel's diverticulum     Social History   Occupational History   Not on file  Tobacco Use   Smoking status: Never   Smokeless tobacco: Never  Vaping Use   Vaping Use: Never used  Substance and Sexual Activity   Alcohol use: No   Drug use: No   Sexual activity: Not on file

## 2021-12-25 ENCOUNTER — Telehealth: Payer: Self-pay | Admitting: Orthopedic Surgery

## 2021-12-25 NOTE — Telephone Encounter (Signed)
Pt came in about a bill he received for wrong date and dr. Hulen Skains Billing on pt's behalf. Was last visit with Dr Sharol Given was 11/12/21 and pt paid co pay of $45.00 and pt seen Dr Harland Dingwall at [facility] 12/10/21 for an reg office visit with no injection. Spoke to Rep Johyura and she states to send to follow up dept for correct. Pt owes co pay to [facility] for visit of $48.40. Rep stated issues will take 2 to 3 wks for correction. Called pt 2x and left vm. If pt calls please read chart.

## 2022-01-15 ENCOUNTER — Ambulatory Visit: Payer: Medicare PPO | Admitting: Podiatry

## 2022-01-15 DIAGNOSIS — M7672 Peroneal tendinitis, left leg: Secondary | ICD-10-CM

## 2022-01-15 DIAGNOSIS — M109 Gout, unspecified: Secondary | ICD-10-CM

## 2022-01-15 MED ORDER — TRIAMCINOLONE ACETONIDE 10 MG/ML IJ SUSP
10.0000 mg | Freq: Once | INTRAMUSCULAR | Status: AC
  Administered 2022-01-15: 10 mg

## 2022-01-15 NOTE — Progress Notes (Signed)
Subjective:   Patient ID: Joel King, male   DOB: 71 y.o.   MRN: [id number]   HPI Patient has developed a lot of pain on the left foot and has not been treated for this in a long time.  Points to the outside of the foot    ROS      Objective:  Physical Exam  Neurovascular status intact with pain in the left foot in the lateral side around peroneal there is also some redness in the area no drainage or other pathology noted      Assessment:  Probability for peroneal tendinitis left cannot rule out acute gout     Plan:  H&P reviewed condition went ahead today did sterile prep and injected the peroneal sheath near its insertion after explaining chances risk discussed also of gout and gave him foods to avoid from a gout perspective.  Patient will be seen back as needed may require other treatment or blood work

## 2022-01-15 NOTE — Patient Instructions (Signed)
Gout  Gout is painful swelling of your joints. Gout is a type of arthritis. It is caused by having too much uric acid in your body. Uric acid is a chemical that is made when your body breaks down substances called purines. If your body has too much uric acid, sharp crystals can form and build up in your joints. This causes pain and swelling. Gout attacks can happen quickly and be very painful (acute gout). Over time, the attacks can affect more joints and happen more often (chronic gout). What are the causes? Gout is caused by too much uric acid in your blood. This can happen because: Your kidneys do not remove enough uric acid from your blood. Your body makes too much uric acid. You eat too many foods that are high in purines. These foods include organ meats, some seafood, and beer. Trauma or stress can bring on an attack. What increases the risk? Having a family history of gout. Being male and middle-aged. Being male and having gone through menopause. Having an organ transplant. Taking certain medicines. Having certain conditions, such as: Being very overweight (obese). Lead poisoning. Kidney disease. A skin condition called psoriasis. Other risks include: Losing weight too quickly. Not having enough water in the body (being dehydrated). Drinking alcohol, especially beer. Drinking beverages that are sweetened with a type of sugar called fructose. What are the signs or symptoms? An attack of acute gout often starts at night and usually happens in just one joint. The most common place is the big toe. Other joints that may be affected include joints of the feet, ankle, knee, fingers, wrist, or elbow. Symptoms may include: Very bad pain. Warmth. Swelling. Stiffness. Tenderness. The affected joint may be very painful to touch. Shiny, red, or purple skin. Chills and fever. Chronic gout may cause symptoms more often. More joints may be involved. You may also have white or yellow lumps  (tophi) on your hands or feet or in other areas near your joints. How is this treated? Treatment for an acute attack may include medicines for pain and swelling, such as: NSAIDs, such as ibuprofen. Steroids taken by mouth or injected into a joint. Colchicine. This can be given by mouth or through an IV tube. Treatment to prevent future attacks may include: Taking small doses of NSAIDs or colchicine daily. Using a medicine that reduces uric acid levels in your blood, such as allopurinol. Making changes to your diet. You may need to see a food expert (dietitian) about what to eat and drink to prevent gout. Follow these instructions at home: During a gout attack  If told, put ice on the painful area. To do this: Put ice in a plastic bag. Place a towel between your skin and the bag. Leave the ice on for 20 minutes, 2-3 times a day. Take off the ice if your skin turns bright red. This is very important. If you cannot feel pain, heat, or cold, you have a greater risk of damage to the area. Raise the painful joint above the level of your heart as often as you can. Rest the joint as much as possible. If the joint is in your leg, you may be given crutches. Follow instructions from your doctor about what you cannot eat or drink. Avoiding future gout attacks Eat a low-purine diet. Avoid foods and drinks such as: Liver. Kidney. Anchovies. Asparagus. Herring. Mushrooms. Mussels. Beer. Stay at a healthy weight. If you want to lose weight, talk with your doctor. Do not   lose weight too fast. Start or continue an exercise plan as told by your doctor. Eating and drinking Avoid drinks sweetened by fructose. Drink enough fluids to keep your pee (urine) pale yellow. If you drink alcohol: Limit how much you have to: 0-1 drink a day for women who are not pregnant. 0-2 drinks a day for men. Know how much alcohol is in a drink. In the [place] one drink equals one 12 oz bottle of beer (355 mL), one 5 oz  glass of wine (148 mL), or one 1 oz glass of hard liquor (44 mL). General instructions Take over-the-counter and prescription medicines only as told by your doctor. Ask your doctor if you should avoid driving or using machines while you are taking your medicine. Return to your normal activities when your doctor says that it is safe. Keep all follow-up visits. Where to find more information National Institutes of Health: www.niams.nih.gov Contact a doctor if: You have another gout attack. You still have symptoms of a gout attack after 10 days of treatment. You have problems (side effects) because of your medicines. You have chills or a fever. You have burning pain when you pee (urinate). You have pain in your lower back or belly. Get help right away if: You have very bad pain. Your pain cannot be controlled. You cannot pee. Summary Gout is painful swelling of the joints. The most common site of pain is the big toe, but it can affect other joints. Medicines and avoiding some foods can help to prevent and treat gout attacks. This information is not intended to replace advice given to you by your health care provider. Make sure you discuss any questions you have with your health care provider. Document Revised: 12/17/2020 Document Reviewed: 12/17/2020 Elsevier Patient Education  2023 Elsevier Inc.  

## 2022-01-22 ENCOUNTER — Encounter: Payer: Self-pay | Admitting: Orthopedic Surgery

## 2022-01-22 ENCOUNTER — Ambulatory Visit (INDEPENDENT_AMBULATORY_CARE_PROVIDER_SITE_OTHER): Payer: Medicare PPO

## 2022-01-22 ENCOUNTER — Ambulatory Visit: Payer: Medicare PPO | Admitting: Orthopedic Surgery

## 2022-01-22 ENCOUNTER — Ambulatory Visit: Payer: Self-pay

## 2022-01-22 DIAGNOSIS — M545 Low back pain, unspecified: Secondary | ICD-10-CM

## 2022-01-22 MED ORDER — CELECOXIB 200 MG PO CAPS: 200.0000 mg | ORAL_CAPSULE | Freq: Two times a day (BID) | ORAL | 0 refills | Status: AC

## 2022-01-22 NOTE — Progress Notes (Signed)
Orthopedic Spine Surgery Office Note  Assessment: Patient is a 71 y.o. male with acute onset of low back pain.  Started about 1 week ago.  No trauma or injury. Has positive sagittal balance.    Plan: -Explained that initially conservative treatment is tried as a significant number of patients may experience relief with these treatment modalities. Discussed that the conservative treatments include:  -activity modification  -physical therapy  -over the counter pain medications -Patient has tried naproxen and activity modification -Recommended he get on a more regular schedule for this acute flare of pain.  I recommended he do 1000 mg of Tylenol with meals.  I prescribed him Celebrex to be taken twice a day at different times from his Tylenol.  I also referred him to physical therapy and encouraged him to do core strengthening exercises -Discussed weight loss to help with his back pain -Patient should return to office in 6 weeks, repeat x-rays of lumbar spine at next visit: None   Patient expressed understanding of the plan and all questions were answered to the patient's satisfaction.   ___________________________________________________________________________   History:  Patient is a 71 y.o. male who presents today for lumbar spine.  Patient has had acute onset of low back pain in the last week.  There was no trauma or injury that brought on the pain.  Pain is felt in the upper part of his lumbar spine.  It is worse with activity.  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  It gets better with rest.  Naproxen helps somewhat.  He has not had pain like this before.  Pain is worse as the day goes on.   Weakness: Denies Symptoms of imbalance: Denies Paresthesias and numbness: Denies Bowel or bladder incontinence: Denies Saddle anesthesia: Denies  Treatments tried: Naproxen and activity modification  Review of  systems: Denies fevers and chills, night sweats, unexplained weight loss, history of cancer, pain that wakes them at night  Past medical history: Hyperlipidemia Hypertension GERD History of DVT BPH  Allergies: Atorvastatin, pravastatin, rosuvastatin  Past surgical history:  Hernia surgery  Social history: Denies use of nicotine product (smoking, vaping, patches, smokeless) Alcohol use: Denies Denies recreational drug use   Physical Exam:  General: no acute distress, appears stated age Neurologic: alert, answering questions appropriately, following commands Respiratory: unlabored breathing on room air, symmetric chest rise Psychiatric: appropriate affect, normal cadence to speech   MSK (spine):  -Strength exam      Left  Right EHL    5/5  5/5 TA    5/5  5/5 GSC    5/5  5/5 Knee extension  5/5  5/5 Hip flexion   5/5  5/5  -Sensory exam    Sensation intact to light touch in L3-S1 nerve distributions of bilateral lower extremities  -Achilles DTR: 1/4 on the left, 1/4 on the right -Patellar tendon DTR: 1/4 on the left, 1/4 on the right  -Straight leg raise: negative -Contralateral straight leg raise: negative -Clonus: no beats bilaterally  -Left hip exam: no pain through range of motion, negative stinchfield -Right hip exam: no pain through range of motion, negative stinchfield  On inspection, has positive sagittal balance.  Decreased lumbar lordosis, increased thoracic kyphosis.  Well-balanced coronally.   Imaging: XR of the lumbar spine from 01/22/2022 was independently reviewed and interpreted, showing degenerative changes in the left hip. Disc height loss at L4/5 and L5/S1. No fracture or  dislocation. No evidence of instability on flexion/extension. Loss of lumbar lordosis. PI of 42. LL of 30.   X-ray of the thoracic spine from 01/22/2022 was independently reviewed and interpreted, showing no fracture or dislocation.  Kyphotic alignment.   Patient name:  Joel King Patient MRN: [id number] Date of visit: 01/22/22

## 2022-01-25 ENCOUNTER — Telehealth: Payer: Self-pay | Admitting: Orthopedic Surgery

## 2022-01-25 MED ORDER — METHOCARBAMOL 500 MG PO TABS: 500.0000 mg | ORAL_TABLET | Freq: Four times a day (QID) | ORAL | 0 refills | Status: DC

## 2022-01-25 NOTE — Telephone Encounter (Signed)
I called and lmom advised that his rx was sent in to the pharmacy

## 2022-01-25 NOTE — Telephone Encounter (Signed)
Patient called in stating the pain medication that was prescribed is not working and he was told by Laurance Flatten to take tylenol but he cannot work because he is in so much pain and he does not come back in to see a doctor until 11/14, please advise

## 2022-01-26 ENCOUNTER — Telehealth: Payer: Self-pay | Admitting: Orthopedic Surgery

## 2022-01-26 NOTE — Telephone Encounter (Signed)
Pt called and wants pain medication, states he is in sever pain and its not any better. Please call and advised patient. [phone].

## 2022-01-27 ENCOUNTER — Telehealth: Payer: Self-pay | Admitting: Orthopedic Surgery

## 2022-01-27 MED ORDER — GABAPENTIN 300 MG PO CAPS: 300.0000 mg | ORAL_CAPSULE | Freq: Three times a day (TID) | ORAL | 0 refills | Status: DC

## 2022-01-27 NOTE — Telephone Encounter (Signed)
Pt called requesting pain medication for back pains. Pt states he need to work a few days out the wk and unable to work with his pain he is having Pt stats the muscle relaxer is not doing anything for his pain. Please send to pharmacy on file. Pt phone number is [phone].

## 2022-01-27 NOTE — Telephone Encounter (Signed)
Gabapentin was sent in this morning

## 2022-02-09 ENCOUNTER — Ambulatory Visit: Payer: Medicare PPO | Admitting: Orthopedic Surgery

## 2022-02-09 DIAGNOSIS — M545 Low back pain, unspecified: Secondary | ICD-10-CM | POA: Diagnosis not present

## 2022-02-09 DIAGNOSIS — M170 Bilateral primary osteoarthritis of knee: Secondary | ICD-10-CM | POA: Diagnosis not present

## 2022-02-11 ENCOUNTER — Ambulatory Visit: Payer: Medicare PPO | Admitting: Orthopedic Surgery

## 2022-02-11 ENCOUNTER — Other Ambulatory Visit: Payer: Self-pay | Admitting: Orthopedic Surgery

## 2022-02-16 ENCOUNTER — Encounter: Payer: Self-pay | Admitting: Orthopedic Surgery

## 2022-02-16 DIAGNOSIS — M170 Bilateral primary osteoarthritis of knee: Secondary | ICD-10-CM

## 2022-02-16 DIAGNOSIS — M545 Low back pain, unspecified: Secondary | ICD-10-CM | POA: Diagnosis not present

## 2022-02-16 NOTE — Progress Notes (Signed)
Office Visit Note   Patient: Joel King           Date of Birth: 08-20-1950           MRN: [id number] Visit Date: 02/09/2022              Requested by: Biagio Borg, MD [address] [place],  [place] [address] PCP: Biagio Borg, MD  Chief Complaint  Patient presents with   Right Knee - Follow-up   Left Knee - Follow-up      HPI: Patient is a 71 year old gentleman who presents in follow-up for chronic lower back pain and chronic bilateral knee pain.  Patient underwent steroid injection for both knees 3 months ago.  Patient has followed up with Dr. Laurance Flatten who recommended surgical intervention for the lumbar spine.  He will follow-up with Dr. Laurance Flatten as needed.  Assessment & Plan: Visit Diagnoses:  1. Low back pain, unspecified back pain laterality, unspecified chronicity, unspecified whether sciatica present   2. Bilateral primary osteoarthritis of knee     Plan: Both knees were injected he tolerated this well he will follow-up with Dr. Laurance Flatten for his back for possible spinal stabilization.  Follow-Up Instructions: Return if symptoms worsen or fail to improve.   Ortho Exam  Patient is alert, oriented, no adenopathy, well-dressed, normal affect, normal respiratory effort. Examination patient has no focal motor weakness in either lower extremity.  He has crepitation range of motion of both knees collaterals and cruciates are stable medial lateral joint line are tender to palpation.  Imaging: No results found. No images are attached to the encounter.  Labs: Lab Results  Component Value Date   HGBA1C 5.8 10/28/2021   HGBA1C 5.6 10/27/2020   HGBA1C 5.7 04/22/2020     Lab Results  Component Value Date   ALBUMIN 4.6 10/28/2021   ALBUMIN 4.3 10/27/2020   ALBUMIN 4.6 04/22/2020    Lab Results  Component Value Date   MG 1.9 10/27/2020   MG 1.8 04/22/2020   Lab Results  Component Value Date   VD25OH 33.31 10/28/2021   VD25OH 35.48 10/27/2020   VD25OH 21  (L) 10/15/2019    No results found for: "PREALBUMIN"    Latest Ref Rng & Units 10/28/2021   11:22 AM 10/27/2020   12:20 PM 10/15/2019    9:58 AM  CBC EXTENDED  WBC 4.0 - 10.5 K/uL 11.2  8.0  9.1   RBC 4.22 - 5.81 Mil/uL 4.79  4.74  5.07   Hemoglobin 13.0 - 17.0 g/dL 15.6  15.4  16.5   HCT 39.0 - 52.0 % 44.8  43.8  47.3   Platelets 150.0 - 400.0 K/uL 201.0  196.0  217   NEUT# 1.4 - 7.7 K/uL 9.0  6.2  6,871   Lymph# 0.7 - 4.0 K/uL 1.0  0.9  983      There is no height or weight on file to calculate BMI.  Orders:  Orders Placed This Encounter  Procedures   Large Joint Inj   No orders of the defined types were placed in this encounter.    Procedures: Large Joint Inj: bilateral knee on 02/16/2022 11:02 AM Indications: pain and diagnostic evaluation Details: 22 G 1.5 in needle, anteromedial approach  Arthrogram: No  Outcome: tolerated well, no immediate complications Procedure, treatment alternatives, risks and benefits explained, specific risks discussed. Consent was given by the patient. Immediately prior to procedure a time out was called to verify the correct patient, procedure,  equipment, support staff and site/side marked as required. Patient was prepped and draped in the usual sterile fashion.      Clinical Data: No additional findings.  ROS:  All other systems negative, except as noted in the HPI. Review of Systems  Objective: Vital Signs: There were no vitals taken for this visit.  Specialty Comments:  No specialty comments available.  PMFS History: Patient Active Problem List   Diagnosis Date Noted   Wheezing 07/15/2020   Vitamin D deficiency 10/23/2019   UTI (urinary tract infection) 10/23/2019   Vertigo 02/06/2019   Cough 03/15/2018   BPH (benign prostatic hyperplasia) 08/13/2017   Effusion, right knee 12/13/2016   Bilateral primary osteoarthritis of knee 08/24/2016   Plantar fasciitis 03/16/2016   Achilles tendinitis, right leg 03/16/2016   Rash  01/15/2016   Stasis dermatitis 01/15/2016   Chronic venous insufficiency 12/30/2015   Cellulitis 12/30/2015   Itching 12/09/2015   Dizziness 03/25/2015   Skin lesion of face 03/25/2015   Abnormal liver function 03/25/2015   Hypokalemia 03/25/2015   Viral upper respiratory tract infection 01/14/2015   Neuritis of right lower extremity 08/20/2014   DVT (deep venous thrombosis) ([place]) 02/08/2014   Right knee pain 12/22/2013   Bronchitis 02/28/2012   Fatigue 11/02/2010   Encounter for well adult exam with abnormal findings 11/01/2010   DEPRESSION 05/31/2008   Diarrhea 05/31/2008   SKIN LESION 12/08/2007   Pre-diabetes 07/22/2007   PEPTIC ULCER DISEASE 07/22/2007   INSOMNIA-SLEEP DISORDER-UNSPEC 07/21/2007   Hyperlipidemia 11/21/2006   Anxiety state 11/21/2006   OBESITY, MILD 11/17/2006   Essential hypertension 11/17/2006   Past Medical History:  Diagnosis Date   ANXIETY 11/21/2006   Cancer ([place])    skin cancer left cheek   DEPRESSION 05/31/2008   DIABETES MELLITUS, TYPE II 07/22/2007   no meds   Diarrhea 05/31/2008   DVT (deep venous thrombosis) ([place]) 02/08/2014   HYPERLIPIDEMIA 11/21/2006   HYPERTENSION 11/17/2006   INSOMNIA-SLEEP DISORDER-UNSPEC 07/21/2007   OBESITY, MILD 11/17/2006   PEPTIC ULCER DISEASE 07/22/2007   PEPTIC ULCER DISEASE, HX OF 11/17/2006   Post-operative nausea and vomiting    past 2 surgeries   SINUSITIS- ACUTE-NOS 12/08/2007   SKIN LESION 12/08/2007    Family History  Problem Relation Age of Onset   Heart disease Mother    Stroke Other    Cancer Other        prostate   Heart disease Father    COPD Brother     Past Surgical History:  Procedure Laterality Date   inguinal herniorrhapy     bilat   s/p incarcerated recurrent ventral hernia     s/p meckel's diverticulum     Social History   Occupational History   Not on file  Tobacco Use   Smoking status: Never   Smokeless tobacco: Never  Vaping Use   Vaping Use: Never used  Substance and  Sexual Activity   Alcohol use: No   Drug use: No   Sexual activity: Not on file

## 2022-02-24 ENCOUNTER — Other Ambulatory Visit: Payer: Self-pay | Admitting: Orthopedic Surgery

## 2022-03-03 ENCOUNTER — Other Ambulatory Visit: Payer: Self-pay | Admitting: Orthopedic Surgery

## 2022-03-08 ENCOUNTER — Ambulatory Visit: Payer: Medicare PPO | Admitting: Orthopedic Surgery

## 2022-03-09 ENCOUNTER — Telehealth: Payer: Self-pay | Admitting: Orthopedic Surgery

## 2022-03-09 NOTE — Telephone Encounter (Signed)
Pt called in requesting copy of xrays... Phone was disconnect...  Pt requesting callback.Marland KitchenMarland Kitchen

## 2022-03-11 ENCOUNTER — Telehealth: Payer: Self-pay | Admitting: Orthopedic Surgery

## 2022-03-11 NOTE — Telephone Encounter (Signed)
Printed and mailed X-rays of T-spine and L-Spine

## 2022-03-26 DIAGNOSIS — M9902 Segmental and somatic dysfunction of thoracic region: Secondary | ICD-10-CM | POA: Diagnosis not present

## 2022-03-26 DIAGNOSIS — M9905 Segmental and somatic dysfunction of pelvic region: Secondary | ICD-10-CM | POA: Diagnosis not present

## 2022-03-26 DIAGNOSIS — M9901 Segmental and somatic dysfunction of cervical region: Secondary | ICD-10-CM | POA: Diagnosis not present

## 2022-03-26 DIAGNOSIS — M546 Pain in thoracic spine: Secondary | ICD-10-CM | POA: Diagnosis not present

## 2022-03-26 DIAGNOSIS — M9903 Segmental and somatic dysfunction of lumbar region: Secondary | ICD-10-CM | POA: Diagnosis not present

## 2022-03-26 DIAGNOSIS — M5451 Vertebrogenic low back pain: Secondary | ICD-10-CM | POA: Diagnosis not present

## 2022-04-05 DIAGNOSIS — M9901 Segmental and somatic dysfunction of cervical region: Secondary | ICD-10-CM | POA: Diagnosis not present

## 2022-04-05 DIAGNOSIS — M9905 Segmental and somatic dysfunction of pelvic region: Secondary | ICD-10-CM | POA: Diagnosis not present

## 2022-04-05 DIAGNOSIS — M9902 Segmental and somatic dysfunction of thoracic region: Secondary | ICD-10-CM | POA: Diagnosis not present

## 2022-04-05 DIAGNOSIS — M9903 Segmental and somatic dysfunction of lumbar region: Secondary | ICD-10-CM | POA: Diagnosis not present

## 2022-04-07 DIAGNOSIS — M9901 Segmental and somatic dysfunction of cervical region: Secondary | ICD-10-CM | POA: Diagnosis not present

## 2022-04-07 DIAGNOSIS — M9905 Segmental and somatic dysfunction of pelvic region: Secondary | ICD-10-CM | POA: Diagnosis not present

## 2022-04-07 DIAGNOSIS — M9903 Segmental and somatic dysfunction of lumbar region: Secondary | ICD-10-CM | POA: Diagnosis not present

## 2022-04-07 DIAGNOSIS — M9902 Segmental and somatic dysfunction of thoracic region: Secondary | ICD-10-CM | POA: Diagnosis not present

## 2022-04-12 DIAGNOSIS — M9903 Segmental and somatic dysfunction of lumbar region: Secondary | ICD-10-CM | POA: Diagnosis not present

## 2022-04-12 DIAGNOSIS — M9902 Segmental and somatic dysfunction of thoracic region: Secondary | ICD-10-CM | POA: Diagnosis not present

## 2022-04-12 DIAGNOSIS — M9901 Segmental and somatic dysfunction of cervical region: Secondary | ICD-10-CM | POA: Diagnosis not present

## 2022-04-12 DIAGNOSIS — M9905 Segmental and somatic dysfunction of pelvic region: Secondary | ICD-10-CM | POA: Diagnosis not present

## 2022-04-14 DIAGNOSIS — M9901 Segmental and somatic dysfunction of cervical region: Secondary | ICD-10-CM | POA: Diagnosis not present

## 2022-04-14 DIAGNOSIS — M9903 Segmental and somatic dysfunction of lumbar region: Secondary | ICD-10-CM | POA: Diagnosis not present

## 2022-04-14 DIAGNOSIS — M9905 Segmental and somatic dysfunction of pelvic region: Secondary | ICD-10-CM | POA: Diagnosis not present

## 2022-04-14 DIAGNOSIS — M9902 Segmental and somatic dysfunction of thoracic region: Secondary | ICD-10-CM | POA: Diagnosis not present

## 2022-04-19 DIAGNOSIS — M9905 Segmental and somatic dysfunction of pelvic region: Secondary | ICD-10-CM | POA: Diagnosis not present

## 2022-04-19 DIAGNOSIS — M9902 Segmental and somatic dysfunction of thoracic region: Secondary | ICD-10-CM | POA: Diagnosis not present

## 2022-04-19 DIAGNOSIS — M9901 Segmental and somatic dysfunction of cervical region: Secondary | ICD-10-CM | POA: Diagnosis not present

## 2022-04-19 DIAGNOSIS — M9903 Segmental and somatic dysfunction of lumbar region: Secondary | ICD-10-CM | POA: Diagnosis not present

## 2022-04-21 DIAGNOSIS — M9905 Segmental and somatic dysfunction of pelvic region: Secondary | ICD-10-CM | POA: Diagnosis not present

## 2022-04-21 DIAGNOSIS — M9902 Segmental and somatic dysfunction of thoracic region: Secondary | ICD-10-CM | POA: Diagnosis not present

## 2022-04-21 DIAGNOSIS — M9903 Segmental and somatic dysfunction of lumbar region: Secondary | ICD-10-CM | POA: Diagnosis not present

## 2022-04-21 DIAGNOSIS — M9901 Segmental and somatic dysfunction of cervical region: Secondary | ICD-10-CM | POA: Diagnosis not present

## 2022-04-26 DIAGNOSIS — M9903 Segmental and somatic dysfunction of lumbar region: Secondary | ICD-10-CM | POA: Diagnosis not present

## 2022-04-26 DIAGNOSIS — M9902 Segmental and somatic dysfunction of thoracic region: Secondary | ICD-10-CM | POA: Diagnosis not present

## 2022-04-26 DIAGNOSIS — M9905 Segmental and somatic dysfunction of pelvic region: Secondary | ICD-10-CM | POA: Diagnosis not present

## 2022-04-26 DIAGNOSIS — M9901 Segmental and somatic dysfunction of cervical region: Secondary | ICD-10-CM | POA: Diagnosis not present

## 2022-04-28 DIAGNOSIS — M9903 Segmental and somatic dysfunction of lumbar region: Secondary | ICD-10-CM | POA: Diagnosis not present

## 2022-04-28 DIAGNOSIS — M9901 Segmental and somatic dysfunction of cervical region: Secondary | ICD-10-CM | POA: Diagnosis not present

## 2022-04-28 DIAGNOSIS — M9905 Segmental and somatic dysfunction of pelvic region: Secondary | ICD-10-CM | POA: Diagnosis not present

## 2022-04-28 DIAGNOSIS — M9902 Segmental and somatic dysfunction of thoracic region: Secondary | ICD-10-CM | POA: Diagnosis not present

## 2022-05-03 DIAGNOSIS — M9902 Segmental and somatic dysfunction of thoracic region: Secondary | ICD-10-CM | POA: Diagnosis not present

## 2022-05-03 DIAGNOSIS — M9903 Segmental and somatic dysfunction of lumbar region: Secondary | ICD-10-CM | POA: Diagnosis not present

## 2022-05-03 DIAGNOSIS — M9905 Segmental and somatic dysfunction of pelvic region: Secondary | ICD-10-CM | POA: Diagnosis not present

## 2022-05-03 DIAGNOSIS — M9901 Segmental and somatic dysfunction of cervical region: Secondary | ICD-10-CM | POA: Diagnosis not present

## 2022-05-05 DIAGNOSIS — M9903 Segmental and somatic dysfunction of lumbar region: Secondary | ICD-10-CM | POA: Diagnosis not present

## 2022-05-05 DIAGNOSIS — M9902 Segmental and somatic dysfunction of thoracic region: Secondary | ICD-10-CM | POA: Diagnosis not present

## 2022-05-05 DIAGNOSIS — M9905 Segmental and somatic dysfunction of pelvic region: Secondary | ICD-10-CM | POA: Diagnosis not present

## 2022-05-05 DIAGNOSIS — M9901 Segmental and somatic dysfunction of cervical region: Secondary | ICD-10-CM | POA: Diagnosis not present

## 2022-05-10 ENCOUNTER — Encounter: Payer: Self-pay | Admitting: Orthopedic Surgery

## 2022-05-10 ENCOUNTER — Ambulatory Visit: Payer: Medicare PPO | Admitting: Orthopedic Surgery

## 2022-05-10 DIAGNOSIS — M170 Bilateral primary osteoarthritis of knee: Secondary | ICD-10-CM

## 2022-05-10 NOTE — Progress Notes (Signed)
Office Visit Note   Patient: Joel King           Date of Birth: 13-Mar-1951           MRN: [id number] Visit Date: 05/10/2022              Requested by: Biagio Borg, MD [id number] [place],  [place] [address] PCP: Biagio Borg, MD  Chief Complaint  Patient presents with   Right Knee - Follow-up   Left Knee - Follow-up      [id number] is a 72 year old gentleman who presents in follow-up for bilateral knee osteoarthritis.  Patient states he gets about 3 months of relief from the injections.  He has seen Dr. Laurance Flatten for his back but is not interested in surgical intervention.   Assessment & Plan: Visit Diagnoses: No diagnosis found.  Plan: Patient will follow-up in 3 months for evaluation for steroid injections in both knees.  Follow-Up Instructions: Return in about 3 months (around 08/08/2022).   Ortho Exam  Patient is alert, oriented, no adenopathy, well-dressed, normal affect, normal respiratory effort. Examination patient has crepitation with range of motion of both knees there is a mild effusion there is no redness no cellulitis he is tender to palpation the patellofemoral joint as well as medial .  Imaging: No results found. No images are attached to the encounter.  Labs: Lab Results  Component Value Date   HGBA1C 5.8 10/28/2021   HGBA1C 5.6 10/27/2020   HGBA1C 5.7 04/22/2020     Lab Results  Component Value Date   ALBUMIN 4.6 10/28/2021   ALBUMIN 4.3 10/27/2020   ALBUMIN 4.6 04/22/2020    Lab Results  Component Value Date   MG 1.9 10/27/2020   MG 1.8 04/22/2020   Lab Results  Component Value Date   VD25OH 33.31 10/28/2021   VD25OH 35.48 10/27/2020   VD25OH 21 (L) 10/15/2019    No results found for: "PREALBUMIN"    Latest Ref Rng & Units 10/28/2021   11:22 AM 10/27/2020   12:20 PM 10/15/2019    9:58 AM  CBC EXTENDED  WBC 4.0 - 10.5 K/uL 11.2  8.0  9.1   RBC 4.22 - 5.81 Mil/uL 4.79  4.74  5.07   Hemoglobin 13.0 - 17.0 g/dL 15.6   15.4  16.5   HCT 39.0 - 52.0 % 44.8  43.8  47.3   Platelets 150.0 - 400.0 K/uL 201.0  196.0  217   NEUT# 1.4 - 7.7 K/uL 9.0  6.2  6,871   Lymph# 0.7 - 4.0 K/uL 1.0  0.9  983      There is no height or weight on file to calculate BMI.  Orders:  Orders Placed This Encounter  Procedures   Large Joint Inj   No orders of the defined types were placed in this encounter.    Procedures: Large Joint Inj: bilateral knee on 05/10/2022 4:53 PM Indications: pain and diagnostic evaluation Details: 22 G 1.5 in needle, anteromedial approach  Arthrogram: No  Outcome: tolerated well, no immediate complications Procedure, treatment alternatives, risks and benefits explained, specific risks discussed. Consent was given by the patient. Immediately prior to procedure a time out was called to verify the correct patient, procedure, equipment, support staff and site/side marked as required. Patient was prepped and draped in the usual sterile fashion.      Clinical Data: No additional findings.  ROS:  All other systems negative, except as noted in the HPI. Review of  Systems  Objective: Vital Signs: There were no vitals taken for this visit.  Specialty Comments:  No specialty comments available.  PMFS History: Patient Active Problem List   Diagnosis Date Noted   Wheezing 07/15/2020   Vitamin D deficiency 10/23/2019   UTI (urinary tract infection) 10/23/2019   Vertigo 02/06/2019   Cough 03/15/2018   BPH (benign prostatic hyperplasia) 08/13/2017   Effusion, right knee 12/13/2016   Bilateral primary osteoarthritis of knee 08/24/2016   Plantar fasciitis 03/16/2016   Achilles tendinitis, right leg 03/16/2016   Rash 01/15/2016   Stasis dermatitis 01/15/2016   Chronic venous insufficiency 12/30/2015   Cellulitis 12/30/2015   Itching 12/09/2015   Dizziness 03/25/2015   Skin lesion of face 03/25/2015   Abnormal liver function 03/25/2015   Hypokalemia 03/25/2015   Viral upper respiratory  tract infection 01/14/2015   Neuritis of right lower extremity 08/20/2014   DVT (deep venous thrombosis) ([facility]) 02/08/2014   Right knee pain 12/22/2013   Bronchitis 02/28/2012   Fatigue 11/02/2010   Encounter for well adult exam with abnormal findings 11/01/2010   DEPRESSION 05/31/2008   Diarrhea 05/31/2008   SKIN LESION 12/08/2007   Pre-diabetes 07/22/2007   PEPTIC ULCER DISEASE 07/22/2007   INSOMNIA-SLEEP DISORDER-UNSPEC 07/21/2007   Hyperlipidemia 11/21/2006   Anxiety state 11/21/2006   OBESITY, MILD 11/17/2006   Essential hypertension 11/17/2006   Past Medical History:  Diagnosis Date   ANXIETY 11/21/2006   Cancer ([facility])    skin cancer left cheek   DEPRESSION 05/31/2008   DIABETES MELLITUS, TYPE II 07/22/2007   no meds   Diarrhea 05/31/2008   DVT (deep venous thrombosis) ([facility]) 02/08/2014   HYPERLIPIDEMIA 11/21/2006   HYPERTENSION 11/17/2006   INSOMNIA-SLEEP DISORDER-UNSPEC 07/21/2007   OBESITY, MILD 11/17/2006   PEPTIC ULCER DISEASE 07/22/2007   PEPTIC ULCER DISEASE, HX OF 11/17/2006   Post-operative nausea and vomiting    past 2 surgeries   SINUSITIS- ACUTE-NOS 12/08/2007   SKIN LESION 12/08/2007    Family History  Problem Relation Age of Onset   Heart disease Mother    Stroke Other    Cancer Other        prostate   Heart disease Father    COPD Brother     Past Surgical History:  Procedure Laterality Date   inguinal herniorrhapy     bilat   s/p incarcerated recurrent ventral hernia     s/p meckel's diverticulum     Social History   Occupational History   Not on file  Tobacco Use   Smoking status: Never   Smokeless tobacco: Never  Vaping Use   Vaping Use: Never used  Substance and Sexual Activity   Alcohol use: No   Drug use: No   Sexual activity: Not on file

## 2022-06-07 ENCOUNTER — Telehealth: Payer: Self-pay | Admitting: Internal Medicine

## 2022-06-07 NOTE — Telephone Encounter (Signed)
[facility] to schedule their annual wellness visit. Appointment made for 07/12/22.  [facility] AW[facility] direct phone # [phone]   Patient called asking why his awv was scheduled @ [facility].    I r/s his appt  with [facility] @ [facility]  Appt 07/12/22

## 2022-06-11 ENCOUNTER — Other Ambulatory Visit: Payer: Self-pay | Admitting: Orthopedic Surgery

## 2022-06-17 ENCOUNTER — Ambulatory Visit (INDEPENDENT_AMBULATORY_CARE_PROVIDER_SITE_OTHER): Payer: Medicare PPO

## 2022-06-17 ENCOUNTER — Ambulatory Visit: Payer: Medicare PPO | Admitting: Podiatry

## 2022-06-17 ENCOUNTER — Encounter: Payer: Self-pay | Admitting: Podiatry

## 2022-06-17 DIAGNOSIS — M7751 Other enthesopathy of right foot: Secondary | ICD-10-CM

## 2022-06-17 DIAGNOSIS — M79671 Pain in right foot: Secondary | ICD-10-CM

## 2022-06-17 MED ORDER — TRIAMCINOLONE ACETONIDE 10 MG/ML IJ SUSP
10.0000 mg | Freq: Once | INTRAMUSCULAR | Status: AC
  Administered 2022-06-17: 10 mg

## 2022-06-17 NOTE — Progress Notes (Signed)
Subjective:   Patient ID: Joel King, male   DOB: 72 y.o.   MRN: [id number]   HPI Patient states he developed a lot of pain on the right first MPJ states that it started yesterday and it has been very sore   ROS      Objective:  Physical Exam  Neurovascular status intact with inflammation pain of the first MPJ medial side plantar right     Assessment:  Possibility for capsulitis first MPJ right versus possibility for gout-like symptomatology     Plan:  H&P reviewed sterile prep injected the capsule of the first MPJ medial 3 mg Kenalog 5 mg Xylocaine discussed gout be careful of foods and reappoint as symptoms indicate  X-rays were negative for osteolysis or swelling around the site of the soft tissue nature

## 2022-06-18 ENCOUNTER — Other Ambulatory Visit: Payer: Self-pay | Admitting: Podiatry

## 2022-06-18 DIAGNOSIS — M79671 Pain in right foot: Secondary | ICD-10-CM

## 2022-06-18 DIAGNOSIS — M7751 Other enthesopathy of right foot: Secondary | ICD-10-CM

## 2022-07-01 ENCOUNTER — Encounter: Payer: Self-pay | Admitting: Internal Medicine

## 2022-07-12 ENCOUNTER — Ambulatory Visit (INDEPENDENT_AMBULATORY_CARE_PROVIDER_SITE_OTHER): Payer: Medicare PPO

## 2022-07-12 VITALS — Ht 67.0 in | Wt 221.0 lb

## 2022-07-12 DIAGNOSIS — Z0000 Encounter for general adult medical examination without abnormal findings: Secondary | ICD-10-CM

## 2022-07-12 NOTE — Patient Instructions (Signed)
Joel King , Thank you for taking time to come for your Medicare Wellness Visit. I appreciate your ongoing commitment to your health goals. Please review the following plan we discussed and let me know if I can assist you in the future.   These are the goals we discussed:  Goals      Weight (lb) < 200 lb (90.7 kg)     More sleep, lose weight        This is a list of the screening recommended for you and due dates:  Health Maintenance  Topic Date Due   Zoster (Shingles) Vaccine (1 of 2) Never done   Yearly kidney health urinalysis for diabetes  10/27/2021   Hemoglobin A1C  04/30/2022   Eye exam for diabetics  05/11/2022   Colon Cancer Screening  09/27/2022   Flu Shot  10/28/2022   Yearly kidney function blood test for diabetes  10/29/2022   Complete foot exam   11/07/2022   Medicare Annual Wellness Visit  07/12/2023   DTaP/Tdap/Td vaccine (4 - Td or Tdap) 11/20/2024   Pneumonia Vaccine  Completed   Hepatitis C Screening: USPSTF Recommendation to screen - Ages 57-79 yo.  Completed   HPV Vaccine  Aged Out   COVID-19 Vaccine  Discontinued    Advanced directives: Will bring copy of living will to be scanned into chart at next appointment  Conditions/risks identified: Keep up the good work  Next appointment: Follow up in one year for your annual wellness visit.   Preventive Care 72 Years and Older, Male  Preventive care refers to lifestyle choices and visits with your health care provider that can promote health and wellness. What does preventive care include? A yearly physical exam. This is also called an annual well check. Dental exams once or twice a year. Routine eye exams. Ask your health care provider how often you should have your eyes checked. Personal lifestyle choices, including: Daily care of your teeth and gums. Regular physical activity. Eating a healthy diet. Avoiding tobacco and drug use. Limiting alcohol use. Practicing safe sex. Taking low doses of  aspirin every day. Taking vitamin and mineral supplements as recommended by your health care provider. What happens during an annual well check? The services and screenings done by your health care provider during your annual well check will depend on your age, overall health, lifestyle risk factors, and family history of disease. Counseling  Your health care provider may ask you questions about your: Alcohol use. Tobacco use. Drug use. Emotional well-being. Home and relationship well-being. Sexual activity. Eating habits. History of falls. Memory and ability to understand (cognition). Work and work Astronomer. Screening  You may have the following tests or measurements: Height, weight, and BMI. Blood pressure. Lipid and cholesterol levels. These may be checked every 5 years, or more frequently if you are over 33 years old. Skin check. Lung cancer screening. You may have this screening every year starting at age 72 if you have a 30-pack-year history of smoking and currently smoke or have quit within the past 15 years. Fecal occult blood test (FOBT) of the stool. You may have this test every year starting at age 72. Flexible sigmoidoscopy or colonoscopy. You may have a sigmoidoscopy every 5 years or a colonoscopy every 10 years starting at age 72. Prostate cancer screening. Recommendations will vary depending on your family history and other risks. Hepatitis C blood test. Hepatitis B blood test. Sexually transmitted disease (STD) testing. Diabetes screening. This is done by  checking your blood sugar (glucose) after you have not eaten for a while (fasting). You may have this done every 1-3 years. Abdominal aortic aneurysm (AAA) screening. You may need this if you are a current or former smoker. Osteoporosis. You may be screened starting at age 72 if you are at high risk. Talk with your health care provider about your test results, treatment options, and if necessary, the need for more  tests. Vaccines  Your health care provider may recommend certain vaccines, such as: Influenza vaccine. This is recommended every year. Tetanus, diphtheria, and acellular pertussis (Tdap, Td) vaccine. You may need a Td booster every 10 years. Zoster vaccine. You may need this after age 28. Pneumococcal 13-valent conjugate (PCV13) vaccine. One dose is recommended after age 21. Pneumococcal polysaccharide (PPSV23) vaccine. One dose is recommended after age 28. Talk to your health care provider about which screenings and vaccines you need and how often you need them. This information is not intended to replace advice given to you by your health care provider. Make sure you discuss any questions you have with your health care provider. Document Released: 04/11/2015 Document Revised: 12/03/2015 Document Reviewed: 01/14/2015 Elsevier Interactive Patient Education  2017 [employer] Prevention in the Home Falls can cause injuries. They can happen to people of all ages. There are many things you can do to make your home safe and to help prevent falls. What can I do on the outside of my home? Regularly fix the edges of walkways and driveways and fix any cracks. Remove anything that might make you trip as you walk through a door, such as a raised step or threshold. Trim any bushes or trees on the path to your home. Use bright outdoor lighting. Clear any walking paths of anything that might make someone trip, such as rocks or tools. Regularly check to see if handrails are loose or broken. Make sure that both sides of any steps have handrails. Any raised decks and porches should have guardrails on the edges. Have any leaves, snow, or ice cleared regularly. Use sand or salt on walking paths during winter. Clean up any spills in your garage right away. This includes oil or grease spills. What can I do in the bathroom? Use night lights. Install grab bars by the toilet and in the tub and shower.  Do not use towel bars as grab bars. Use non-skid mats or decals in the tub or shower. If you need to sit down in the shower, use a plastic, non-slip stool. Keep the floor dry. Clean up any water that spills on the floor as soon as it happens. Remove soap buildup in the tub or shower regularly. Attach bath mats securely with double-sided non-slip rug tape. Do not have throw rugs and other things on the floor that can make you trip. What can I do in the bedroom? Use night lights. Make sure that you have a light by your bed that is easy to reach. Do not use any sheets or blankets that are too big for your bed. They should not hang down onto the floor. Have a firm chair that has side arms. You can use this for support while you get dressed. Do not have throw rugs and other things on the floor that can make you trip. What can I do in the kitchen? Clean up any spills right away. Avoid walking on wet floors. Keep items that you use a lot in easy-to-reach places. If you need to reach  something above you, use a strong step stool that has a grab bar. Keep electrical cords out of the way. Do not use floor polish or wax that makes floors slippery. If you must use wax, use non-skid floor wax. Do not have throw rugs and other things on the floor that can make you trip. What can I do with my stairs? Do not leave any items on the stairs. Make sure that there are handrails on both sides of the stairs and use them. Fix handrails that are broken or loose. Make sure that handrails are as long as the stairways. Check any carpeting to make sure that it is firmly attached to the stairs. Fix any carpet that is loose or worn. Avoid having throw rugs at the top or bottom of the stairs. If you do have throw rugs, attach them to the floor with carpet tape. Make sure that you have a light switch at the top of the stairs and the bottom of the stairs. If you do not have them, ask someone to add them for you. What else  can I do to help prevent falls? Wear shoes that: Do not have high heels. Have rubber bottoms. Are comfortable and fit you well. Are closed at the toe. Do not wear sandals. If you use a stepladder: Make sure that it is fully opened. Do not climb a closed stepladder. Make sure that both sides of the stepladder are locked into place. Ask someone to hold it for you, if possible. Clearly mark and make sure that you can see: Any grab bars or handrails. First and last steps. Where the edge of each step is. Use tools that help you move around (mobility aids) if they are needed. These include: Canes. Walkers. Scooters. Crutches. Turn on the lights when you go into a dark area. Replace any light bulbs as soon as they burn out. Set up your furniture so you have a clear path. Avoid moving your furniture around. If any of your floors are uneven, fix them. If there are any pets around you, be aware of where they are. Review your medicines with your doctor. Some medicines can make you feel dizzy. This can increase your chance of falling. Ask your doctor what other things that you can do to help prevent falls. This information is not intended to replace advice given to you by your health care provider. Make sure you discuss any questions you have with your health care provider. Document Released: 01/09/2009 Document Revised: 08/21/2015 Document Reviewed: 04/19/2014 Elsevier Interactive Patient Education  2017 [employer].

## 2022-07-12 NOTE — Progress Notes (Signed)
.  Subjective:   Joel King is a 72 y.o. male who presents for Medicare Annual/Subsequent preventive examination.  I connected with  Joel King on 07/12/22 by a audio enabled telemedicine application and verified that I am speaking with the correct person using two identifiers.  Patient Location: Home  Provider Location: Home Office  I discussed the limitations of evaluation and management by telemedicine. The patient expressed understanding and agreed to proceed.      Cardiac Risk Factors include: advanced age (>43men, >57 women)     Objective:    Today's Vitals   07/12/22 1253  Weight: 221 lb (100.2 kg)  Height:  (1.702 m)   Body mass index is 34.61 kg/m.     07/12/2022    1:07 PM 07/06/2021    8:50 AM 09/26/2017    7:50 AM  Advanced Directives  Does Patient Have a Medical Advance Directive? Yes No No  Type of Estate agent of [place];Living will    Copy of Healthcare Power of Attorney in Chart? No - copy requested    Would patient like information on creating a medical advance directive? Yes (MAU/Ambulatory/Procedural Areas - Information given) No - Patient declined     Current Medications (verified) Outpatient Encounter Medications as of 07/12/2022  Medication Sig   amLODipine (NORVASC) 10 MG tablet TAKE 1 TABLET (10 MG TOTAL) BY MOUTH DAILY.   aspirin 81 MG tablet Take 81 mg by mouth daily.   atenolol-chlorthalidone (TENORETIC) 100-25 MG tablet Take 1 tablet by mouth daily.   benazepril (LOTENSIN) 40 MG tablet Take 1 tablet (40 mg total) by mouth daily.   famotidine (PEPCID) 20 MG tablet TAKE 1 TABLET (20 MG TOTAL) BY MOUTH 2 (TWO) TIMES DAILY.   finasteride (PROSCAR) 5 MG tablet TAKE 1 TABLET (5 MG TOTAL) BY MOUTH DAILY.   gabapentin (NEURONTIN) 300 MG capsule TAKE 1 CAPSULE(300 MG) BY MOUTH THREE TIMES DAILY   methocarbamol (ROBAXIN) 500 MG tablet TAKE 1 TABLET(500 MG) BY MOUTH FOUR TIMES DAILY FOR 14 DAYS   Multiple  Vitamins-Minerals (CENTRUM SILVER PO) Take by mouth daily at 6 (six) AM.   Multiple Vitamins-Minerals (OCUVITE PRESERVISION PO) Take by mouth.   potassium chloride (MICRO-K) 10 MEQ CR capsule TAKE 2 CAPSULES EVERY DAY   simvastatin (ZOCOR) 40 MG tablet TAKE 1 TABLET (40 MG TOTAL) BY MOUTH AT BEDTIME.   HYDROcodone bit-homatropine (HYCODAN) 5-1.5 MG/5ML syrup Take 5 mLs by mouth every 8 (eight) hours as needed for cough. (Patient not taking: Reported on 07/12/2022)   No facility-administered encounter medications on file as of 07/12/2022.    Allergies (verified) Atorvastatin, Pravastatin sodium, and Rosuvastatin   History: Past Medical History:  Diagnosis Date   ANXIETY 11/21/2006   Cancer    skin cancer left cheek   DEPRESSION 05/31/2008   DIABETES MELLITUS, TYPE II 07/22/2007   no meds   Diarrhea 05/31/2008   DVT (deep venous thrombosis) 02/08/2014   HYPERLIPIDEMIA 11/21/2006   HYPERTENSION 11/17/2006   INSOMNIA-SLEEP DISORDER-UNSPEC 07/21/2007   OBESITY, MILD 11/17/2006   PEPTIC ULCER DISEASE 07/22/2007   PEPTIC ULCER DISEASE, HX OF 11/17/2006   Post-operative nausea and vomiting    past 2 surgeries   SINUSITIS- ACUTE-NOS 12/08/2007   SKIN LESION 12/08/2007   Past Surgical History:  Procedure Laterality Date   inguinal herniorrhapy     bilat   s/p incarcerated recurrent ventral hernia     s/p meckel's diverticulum     Family History  Problem  Relation Age of Onset   Heart disease Mother    Stroke Other    Cancer Other        prostate   Heart disease Father    COPD Brother    Social History   Socioeconomic History   Marital status: Single    Spouse name: Not on file   Number of children: Not on file   Years of education: Not on file   Highest education level: Not on file  Occupational History   Not on file  Tobacco Use   Smoking status: Never   Smokeless tobacco: Never  Vaping Use   Vaping Use: Never used  Substance and Sexual Activity   Alcohol use: No   Drug  use: No   Sexual activity: Not on file  Other Topics Concern   Not on file  Social History Narrative   Lived with mother for 53 yrs   Social Determinants of Health   Financial Resource Strain: Low Risk  (07/12/2022)   Overall Financial Resource Strain (CARDIA)    Difficulty of Paying Living Expenses: Not hard at all  Food Insecurity: No Food Insecurity (07/12/2022)   Hunger Vital Sign    Worried About Running Out of Food in the Last Year: Never true    Ran Out of Food in the Last Year: Never true  Transportation Needs: No Transportation Needs (07/12/2022)   PRAPARE - Administrator, Civil Service (Medical): No    Lack of Transportation (Non-Medical): No  Physical Activity: Inactive (07/12/2022)   Exercise Vital Sign    Days of Exercise per Week: 0 days    Minutes of Exercise per Session: 0 min  Stress: No Stress Concern Present (07/06/2021)   [employer] of Occupational Health - Occupational Stress Questionnaire    Feeling of Stress : Not at all  Social Connections: Socially Integrated (07/12/2022)   Social Connection and Isolation Panel [NHANES]    Frequency of Communication with Friends and Family: More than three times a week    Frequency of Social Gatherings with Friends and Family: More than three times a week    Attends Religious Services: More than 4 times per year    Active Member of [employer] or Organizations: Yes    Attends Engineer, structural: More than 4 times per year    Marital Status: Married    Tobacco Counseling Counseling given: Not Answered   Clinical Intake:  Pre-visit preparation completed: Yes  Pain : No/denies pain     BMI - recorded: 34.61 Nutritional Risks: None Diabetes: No  How often do you need to have someone help you when you read instructions, pamphlets, or other written materials from your doctor or pharmacy?: 1 - Never  Diabetic? No   Interpreter Needed?: No      Activities of Daily Living    07/12/2022     1:09 PM  In your present state of health, do you have any difficulty performing the following activities:  Hearing? 0  Vision? 0  Difficulty concentrating or making decisions? 0  Walking or climbing stairs? 0  Dressing or bathing? 0  Doing errands, shopping? 0  Preparing Food and eating ? N  Using the Toilet? N  In the past six months, have you accidently leaked urine? N  Do you have problems with loss of bowel control? N  Managing your Medications? N  Managing your Finances? N  Housekeeping or managing your Housekeeping? N    Patient Care Team:  [name], MD as PCP - General  Indicate any recent Medical Services you may have received from other than [facility] providers in the past year (date may be approximate).     Assessment:   This is a routine wellness examination for [place].  Hearing/Vision screen Hearing Screening - Comments:: Denies hearing difficulties   Vision Screening - Comments:: Wears rx glasses - up to date with routine eye exams with [facility]  exam scheduled Dr. Burgess Estelle  Dietary issues and exercise activities discussed: Current Exercise Habits: The patient does not participate in regular exercise at present   Goals Addressed             This Visit's Progress    Weight (lb) < 200 lb (90.7 kg)       More sleep, lose weight       Depression Screen    07/12/2022    1:12 PM 11/06/2021    2:06 PM 11/06/2021    1:53 PM 07/06/2021    8:51 AM 07/06/2021    8:48 AM 11/04/2020    3:44 PM 10/23/2019    8:31 AM  PHQ 2/9 Scores  PHQ - 2 Score 0 0 0 0 0 0 1  PHQ- 9 Score   1        Fall Risk    07/12/2022   12:59 PM 11/06/2021    2:05 PM 11/06/2021    1:53 PM 07/06/2021    8:51 AM 11/04/2020    3:44 PM  Fall Risk   Falls in the past year? 0 0 0 0 0  Number falls in past yr: 0 0 0 0 0  Injury with Fall? 0 0 0 0 0  Risk for fall due to : No Fall Risks  No Fall Risks    Follow up Falls prevention discussed  Falls evaluation completed Falls  evaluation completed     FALL RISK PREVENTION PERTAINING TO THE HOME:  Any stairs in or around the home? No  If so, are there any without handrails? No  Home free of loose throw rugs in walkways, pet beds, electrical cords, etc? No  Adequate lighting in your home to reduce risk of falls? Yes   ASSISTIVE DEVICES UTILIZED TO PREVENT FALLS:  Life alert? No  Use of a cane, walker or w/c? No Grab bars in the bathroom? Yes  Shower chair or bench in shower? No  Elevated toilet seat or a handicapped toilet? No   TIMED UP AND GO:  Was the test performed? No , televisit         07/12/2022    1:12 PM  6CIT Screen  What Year? 0 points  What month? 0 points  What time? 0 points  Count back from 20 0 points  Months in reverse 0 points  Repeat phrase 0 points  Total Score 0 points    Immunizations Immunization History  Administered Date(s) Administered   DTP 09/05/1997   Fluad Quad(high Dose 65+) 11/11/2018   Influenza Split 12/20/2012   Influenza, High Dose Seasonal PF 12/11/2020   Influenza,inj,Quad PF,6+ Mos 11/29/2013   Influenza-Unspecified 11/20/2015, 11/28/2016, 11/26/2018   PFIZER(Purple Top)SARS-COV-2 Vaccination 04/22/2019, 05/14/2019, 12/21/2019, 07/18/2020, 12/06/2020   Pneumococcal Conjugate-13 09/27/2015   Pneumococcal Polysaccharide-23 03/30/1995, 07/29/2008, 12/09/2015, 11/28/2016   Td 07/29/2008   Tdap 11/21/2014   Zoster, Live 11/21/2014    TDAP status: Up to date  Flu Vaccine status: Up to date  Pneumococcal vaccine status: Up to date  Covid-19 vaccine status: Completed  vaccines  Qualifies for Shingles Vaccine? Yes   Zostavax completed Yes   Shingrix Completed?: No.    Education has been provided regarding the importance of this vaccine. Patient has been advised to call insurance company to determine out of pocket expense if they have not yet received this vaccine. Advised may also receive vaccine at local pharmacy or Health Dept. Verbalized  acceptance and understanding.  Screening Tests Health Maintenance  Topic Date Due   Zoster Vaccines- Shingrix (1 of 2) Never done   Diabetic kidney evaluation - Urine ACR  10/27/2021   HEMOGLOBIN A1C  04/30/2022   OPHTHALMOLOGY EXAM  05/11/2022   COLONOSCOPY (Pts 45-67yrs Insurance coverage will need to be confirmed)  09/27/2022   INFLUENZA VACCINE  10/28/2022   Diabetic kidney evaluation - eGFR measurement  10/29/2022   FOOT EXAM  11/07/2022   Medicare Annual Wellness (AWV)  07/12/2023   DTaP/Tdap/Td (4 - Td or Tdap) 11/20/2024   Pneumonia Vaccine 51+ Years old  Completed   Hepatitis C Screening  Completed   HPV VACCINES  Aged Out   COVID-19 Vaccine  Discontinued    Health Maintenance  Health Maintenance Due  Topic Date Due   Zoster Vaccines- Shingrix (1 of 2) Never done   Diabetic kidney evaluation - Urine ACR  10/27/2021   HEMOGLOBIN A1C  04/30/2022   OPHTHALMOLOGY EXAM  05/11/2022    Colorectal cancer screening: Type of screening: Colonoscopy. Completed 09/26/2017. Repeat every 5 years  Lung Cancer Screening: (Low Dose CT Chest recommended if Age 33-80 years, 30 pack-year currently smoking OR have quit w/in 15years.) does qualify.   Lung Cancer Screening Referral: N/A  Additional Screening:  Hepatitis C Screening: does qualify; Completed 08/05/2016  Vision Screening: Recommended annual ophthalmology exams for early detection of glaucoma and other disorders of the eye. Is the patient up to date with their annual eye exam?  Yes   Who is the provider or what is the name of the office in which the patient attends annual eye exams? [facility]  If pt is not established with a provider, would they like to be referred to a provider to establish care? No .   Dental Screening: Recommended annual dental exams for proper oral hygiene  Community Resource Referral / Chronic Care Management: CRR required this visit?  No   CCM required this visit?  No       Plan:     I have personally reviewed and noted the following in the patient's chart:   Medical and social history Use of alcohol, tobacco or illicit drugs  Current medications and supplements including opioid prescriptions. Patient is not currently taking opioid prescriptions. Functional ability and status Nutritional status Physical activity Advanced directives List of other physicians Hospitalizations, surgeries, and ER visits in previous 12 months Vitals Screenings to include cognitive, depression, and falls Referrals and appointments  In addition, I have reviewed and discussed with patient certain preventive protocols, quality metrics, and best practice recommendations. A written personalized care plan for preventive services as well as general preventive health recommendations were provided to patient.     Milus Mallick, CMA   07/12/2022   Nurse Notes: Discussed needed vaccines, and testing

## 2022-08-16 ENCOUNTER — Ambulatory Visit: Payer: Medicare PPO | Admitting: Orthopedic Surgery

## 2022-08-16 ENCOUNTER — Encounter: Payer: Self-pay | Admitting: Orthopedic Surgery

## 2022-08-16 DIAGNOSIS — M170 Bilateral primary osteoarthritis of knee: Secondary | ICD-10-CM | POA: Diagnosis not present

## 2022-08-16 NOTE — Progress Notes (Signed)
Office Visit Note   Patient: Joel King           Date of Birth: 04-Jan-1951           MRN: [id number] Visit Date: 08/16/2022              Requested by: [name], MD [address] [place],  [place] [address] PCP: [name], MD  Chief Complaint  Patient presents with   Right Knee - Follow-up    S/p bilateral knee injections 05/10/2022   Left Knee - Follow-up      HPI: Patient is a 72 year old gentleman with chronic osteoarthritis bilateral knees.  Patient has had interval relief with steroid injections.  Assessment & Plan: Visit Diagnoses:  1. Bilateral primary osteoarthritis of knee     Plan: Both knees were injected he tolerated this well follow-up as needed.  Follow-Up Instructions: Return if symptoms worsen or fail to improve.   Ortho Exam  Patient is alert, oriented, no adenopathy, well-dressed, normal affect, normal respiratory effort. Examination patient has an antalgic gait.  He has crepitation with range of motion of both knees.  There is a mild effusion.  Imaging: No results found. No images are attached to the encounter.  Labs: Lab Results  Component Value Date   HGBA1C 5.8 10/28/2021   HGBA1C 5.6 10/27/2020   HGBA1C 5.7 04/22/2020     Lab Results  Component Value Date   ALBUMIN 4.6 10/28/2021   ALBUMIN 4.3 10/27/2020   ALBUMIN 4.6 04/22/2020    Lab Results  Component Value Date   MG 1.9 10/27/2020   MG 1.8 04/22/2020   Lab Results  Component Value Date   VD25OH 33.31 10/28/2021   VD25OH 35.48 10/27/2020   VD25OH 21 (L) 10/15/2019    No results found for: "PREALBUMIN"    Latest Ref Rng & Units 10/28/2021   11:22 AM 10/27/2020   12:20 PM 10/15/2019    9:58 AM  CBC EXTENDED  WBC 4.0 - 10.5 K/uL 11.2  8.0  9.1   RBC 4.22 - 5.81 Mil/uL 4.79  4.74  5.07   Hemoglobin 13.0 - 17.0 g/dL 62.1  30.8  65.7   HCT 39.0 - 52.0 % 44.8  43.8  47.3   Platelets 150.0 - 400.0 K/uL 201.0  196.0  217   NEUT# 1.4 - 7.7 K/uL 9.0  6.2   6,871   Lymph# 0.7 - 4.0 K/uL 1.0  0.9  983      There is no height or weight on file to calculate BMI.  Orders:  No orders of the defined types were placed in this encounter.  No orders of the defined types were placed in this encounter.    Procedures: Large Joint Inj: bilateral knee on 08/16/2022 2:37 PM Indications: pain and diagnostic evaluation Details: 22 G 1.5 in needle, anteromedial approach  Arthrogram: No  Outcome: tolerated well, no immediate complications Procedure, treatment alternatives, risks and benefits explained, specific risks discussed. Consent was given by the patient. Immediately prior to procedure a time out was called to verify the correct patient, procedure, equipment, support staff and site/side marked as required. Patient was prepped and draped in the usual sterile fashion.      Clinical Data: No additional findings.  ROS:  All other systems negative, except as noted in the HPI. Review of Systems  Objective: Vital Signs: There were no vitals taken for this visit.  Specialty Comments:  No specialty comments available.  PMFS  History: Patient Active Problem List   Diagnosis Date Noted   Wheezing 07/15/2020   Vitamin D deficiency 10/23/2019   UTI (urinary tract infection) 10/23/2019   Vertigo 02/06/2019   Cough 03/15/2018   BPH (benign prostatic hyperplasia) 08/13/2017   Effusion, right knee 12/13/2016   Bilateral primary osteoarthritis of knee 08/24/2016   Plantar fasciitis 03/16/2016   Achilles tendinitis, right leg 03/16/2016   Rash 01/15/2016   Stasis dermatitis 01/15/2016   Chronic venous insufficiency 12/30/2015   Cellulitis 12/30/2015   Itching 12/09/2015   Dizziness 03/25/2015   Skin lesion of face 03/25/2015   Abnormal liver function 03/25/2015   Hypokalemia 03/25/2015   Viral upper respiratory tract infection 01/14/2015   Neuritis of right lower extremity 08/20/2014   DVT (deep venous thrombosis) (HCC) 02/08/2014   Right  knee pain 12/22/2013   Bronchitis 02/28/2012   Fatigue 11/02/2010   Encounter for well adult exam with abnormal findings 11/01/2010   DEPRESSION 05/31/2008   Diarrhea 05/31/2008   SKIN LESION 12/08/2007   Pre-diabetes 07/22/2007   PEPTIC ULCER DISEASE 07/22/2007   INSOMNIA-SLEEP DISORDER-UNSPEC 07/21/2007   Hyperlipidemia 11/21/2006   Anxiety state 11/21/2006   OBESITY, MILD 11/17/2006   Essential hypertension 11/17/2006   Past Medical History:  Diagnosis Date   ANXIETY 11/21/2006   Cancer (HCC)    skin cancer left cheek   DEPRESSION 05/31/2008   DIABETES MELLITUS, TYPE II 07/22/2007   no meds   Diarrhea 05/31/2008   DVT (deep venous thrombosis) (HCC) 02/08/2014   HYPERLIPIDEMIA 11/21/2006   HYPERTENSION 11/17/2006   INSOMNIA-SLEEP DISORDER-UNSPEC 07/21/2007   OBESITY, MILD 11/17/2006   PEPTIC ULCER DISEASE 07/22/2007   PEPTIC ULCER DISEASE, HX OF 11/17/2006   Post-operative nausea and vomiting    past 2 surgeries   SINUSITIS- ACUTE-NOS 12/08/2007   SKIN LESION 12/08/2007    Family History  Problem Relation Age of Onset   Heart disease Mother    Stroke Other    Cancer Other        prostate   Heart disease Father    COPD Brother     Past Surgical History:  Procedure Laterality Date   inguinal herniorrhapy     bilat   s/p incarcerated recurrent ventral hernia     s/p meckel's diverticulum     Social History   Occupational History   Not on file  Tobacco Use   Smoking status: Never   Smokeless tobacco: Never  Vaping Use   Vaping Use: Never used  Substance and Sexual Activity   Alcohol use: No   Drug use: No   Sexual activity: Not on file

## 2022-08-25 ENCOUNTER — Other Ambulatory Visit: Payer: Self-pay | Admitting: Orthopedic Surgery

## 2022-09-15 ENCOUNTER — Telehealth: Payer: Self-pay

## 2022-09-15 NOTE — Telephone Encounter (Signed)
NO SHOW. Called at 3:00 & 3:15 left messages stating that he had missed his appointment and needed to call back by 5pm to reschedule his pre visit or his colonoscopy on 7/8/24would be cancelled.

## 2022-09-16 NOTE — Telephone Encounter (Signed)
Patient rescheduled pre visit for 09/20/22

## 2022-09-20 ENCOUNTER — Ambulatory Visit (AMBULATORY_SURGERY_CENTER): Payer: Medicare PPO

## 2022-09-20 ENCOUNTER — Encounter: Payer: Self-pay | Admitting: Internal Medicine

## 2022-09-20 VITALS — Ht 67.0 in | Wt 224.5 lb

## 2022-09-20 DIAGNOSIS — Z86010 Personal history of colonic polyps: Secondary | ICD-10-CM

## 2022-09-20 MED ORDER — NA SULFATE-K SULFATE-MG SULF 17.5-3.13-1.6 GM/177ML PO SOLN
1.0000 | Freq: Once | ORAL | 0 refills | Status: AC
Start: 2022-09-20 — End: 2022-09-20

## 2022-09-20 NOTE — Progress Notes (Signed)

## 2022-09-22 NOTE — Telephone Encounter (Signed)
Patient is calling requesting to speak with you in regards instructions for up coming procedure.Please advise

## 2022-09-22 NOTE — Telephone Encounter (Signed)
No answer. LMOM to call back.

## 2022-10-04 ENCOUNTER — Ambulatory Visit (AMBULATORY_SURGERY_CENTER): Payer: Medicare PPO | Admitting: Internal Medicine

## 2022-10-04 ENCOUNTER — Encounter: Payer: Self-pay | Admitting: Internal Medicine

## 2022-10-04 VITALS — BP 116/54 | HR 63 | Temp 98.6°F | Resp 17 | Ht 67.0 in | Wt 224.0 lb

## 2022-10-04 DIAGNOSIS — Z1211 Encounter for screening for malignant neoplasm of colon: Secondary | ICD-10-CM | POA: Diagnosis not present

## 2022-10-04 DIAGNOSIS — Z86010 Personal history of colonic polyps: Secondary | ICD-10-CM

## 2022-10-04 DIAGNOSIS — K219 Gastro-esophageal reflux disease without esophagitis: Secondary | ICD-10-CM | POA: Diagnosis not present

## 2022-10-04 DIAGNOSIS — Z09 Encounter for follow-up examination after completed treatment for conditions other than malignant neoplasm: Secondary | ICD-10-CM

## 2022-10-04 DIAGNOSIS — I10 Essential (primary) hypertension: Secondary | ICD-10-CM | POA: Diagnosis not present

## 2022-10-04 DIAGNOSIS — D122 Benign neoplasm of ascending colon: Secondary | ICD-10-CM

## 2022-10-04 MED ORDER — SODIUM CHLORIDE 0.9 % IV SOLN: 500.0000 mL | Freq: Once | INTRAVENOUS | Status: DC

## 2022-10-04 NOTE — Progress Notes (Signed)
Called to room to assist during endoscopic procedure.  Patient ID and intended procedure confirmed with present staff. Received instructions for my participation in the procedure from the performing physician.  

## 2022-10-04 NOTE — Progress Notes (Signed)
HISTORY OF PRESENT ILLNESS:  Joel King is a 72 y.o. male with a history of adenomatous colon polyp.  Now for surveillance colonoscopy  REVIEW OF SYSTEMS:  All non-GI ROS negative except for  Past Medical History:  Diagnosis Date   ANXIETY 11/21/2006   Cancer (HCC)    skin cancer left cheek   DEPRESSION 05/31/2008   DIABETES MELLITUS, TYPE II 07/22/2007   no meds   Diarrhea 05/31/2008   DVT (deep venous thrombosis) (HCC) 02/08/2014   HYPERLIPIDEMIA 11/21/2006   HYPERTENSION 11/17/2006   INSOMNIA-SLEEP DISORDER-UNSPEC 07/21/2007   OBESITY, MILD 11/17/2006   PEPTIC ULCER DISEASE 07/22/2007   PEPTIC ULCER DISEASE, HX OF 11/17/2006   Post-operative nausea and vomiting    past 2 surgeries   SINUSITIS- ACUTE-NOS 12/08/2007   SKIN LESION 12/08/2007    Past Surgical History:  Procedure Laterality Date   inguinal herniorrhapy     bilat   s/p incarcerated recurrent ventral hernia     s/p meckel's diverticulum      Social History VICENTE KIRKPATRICK  reports that he has never smoked. He has never used smokeless tobacco. He reports that he does not drink alcohol and does not use drugs.  family history includes COPD in his brother; Cancer in an other family member; Heart disease in his father and mother; Stroke in an other family member.  Allergies  Allergen Reactions   Atorvastatin     Unsure of results   Pravastatin Sodium     Unsure of reaction   Rosuvastatin     Unsure of reaction       PHYSICAL EXAMINATION: Vital signs: BP (!) 155/81   Pulse 69   Temp 98.6 F (37 C)   Resp 14   Ht 5\' 7"  (1.702 m)   Wt 224 lb (101.6 kg)   SpO2 98%   BMI 35.08 kg/m  General: Well-developed, well-nourished, no acute distress HEENT: Sclerae are anicteric, conjunctiva pink. Oral mucosa intact Lungs: Clear Heart: Regular Abdomen: soft, nontender, nondistended, no obvious ascites, no peritoneal signs, normal bowel sounds. No organomegaly. Extremities: No edema Psychiatric: alert and  oriented x3. Cooperative     ASSESSMENT:   History of adenomatous colon polyp  PLAN:  Surveillance colonoscopy

## 2022-10-04 NOTE — Op Note (Signed)
[facility] Patient Name: Joel King Procedure Date: 10/04/2022 8:59 AM MRN: [id number] Endoscopist: Wilhemina Bonito. Marina Goodell , MD, [id number] Age: 72 Referring MD:  Date of Birth: 08/03/1950 Gender: Male Account #: [id number] Procedure:                Colonoscopy with cold snare polypectomy x 1 Indications:              High risk colon cancer surveillance: Personal                            history of non-advanced adenoma 2019. Medicines:                Monitored Anesthesia Care Procedure:                Pre-Anesthesia Assessment:                           - Prior to the procedure, a History and Physical                            was performed, and patient medications and                            allergies were reviewed. The patient's tolerance of                            previous anesthesia was also reviewed. The risks                            and benefits of the procedure and the sedation                            options and risks were discussed with the patient.                            All questions were answered, and informed consent                            was obtained. Prior Anticoagulants: The patient has                            taken no anticoagulant or antiplatelet agents. ASA                            Grade Assessment: II - A patient with mild systemic                            disease. After reviewing the risks and benefits,                            the patient was deemed in satisfactory condition to                            undergo the procedure.  After obtaining informed consent, the colonoscope                            was passed under direct vision. Throughout the                            procedure, the patient's blood pressure, pulse, and                            oxygen saturations were monitored continuously. The                            CF [id number] #[id number] was introduced through the anus                             and advanced to the the cecum, identified by                            appendiceal orifice and ileocecal valve. The                            ileocecal valve, appendiceal orifice, and rectum                            were photographed. The quality of the bowel                            preparation was good. The colonoscopy was performed                            without difficulty. The patient tolerated the                            procedure well. The bowel preparation used was                            SUPREP via split dose instruction. Scope In: 9:17:41 AM Scope Out: 9:28:57 AM Scope Withdrawal Time: 0 hours 9 minutes 1 second  Total Procedure Duration: 0 hours 11 minutes 16 seconds  Findings:                 A 6 mm polyp was found in the ascending colon. The                            polyp was removed with a cold snare. Resection and                            retrieval were complete.                           Internal hemorrhoids were found during retroflexion.                           The exam was otherwise without abnormality on  direct and retroflexion views. Complications:            No immediate complications. Estimated blood loss:                            None. Estimated Blood Loss:     Estimated blood loss: none. Impression:               - One 6 mm polyp in the ascending colon, removed                            with a cold snare. Resected and retrieved.                           - Internal hemorrhoids.                           - The examination was otherwise normal on direct                            and retroflexion views. Recommendation:           - Repeat colonoscopy in 7 years for surveillance.                           - Patient has a contact number available for                            emergencies. The signs and symptoms of potential                            delayed complications were discussed with the                             patient. Return to normal activities tomorrow.                            Written discharge instructions were provided to the                            patient.                           - Resume previous diet.                           - Continue present medications.                           - Await pathology results. Wilhemina Bonito. Marina Goodell, MD 10/04/2022 9:33:39 AM This report has been signed electronically.

## 2022-10-04 NOTE — Progress Notes (Signed)
Pt's states no medical or surgical changes since previsit or office visit. 

## 2022-10-04 NOTE — Progress Notes (Signed)
Report to PACU, RN, vss, BBS= Clear.  

## 2022-10-04 NOTE — Patient Instructions (Signed)
Resume previous diet Continue present medications Await pathology results Repeat colonoscopy in 7 years Handouts/information given for polyps and hemorrhoids  YOU HAD AN ENDOSCOPIC PROCEDURE TODAY AT THE [facility]:   Refer to the procedure report that was given to you for any specific questions about what was found during the examination.  If the procedure report does not answer your questions, please call your gastroenterologist to clarify.  If you requested that your care partner not be given the details of your procedure findings, then the procedure report has been included in a sealed envelope for you to review at your convenience later.  YOU SHOULD EXPECT: Some feelings of bloating in the abdomen. Passage of more gas than usual.  Walking can help get rid of the air that was put into your GI tract during the procedure and reduce the bloating. If you had a lower endoscopy (such as a colonoscopy or flexible sigmoidoscopy) you may notice spotting of blood in your stool or on the toilet paper. If you underwent a bowel prep for your procedure, you may not have a normal bowel movement for a few days.  Please Note:  You might notice some irritation and congestion in your nose or some drainage.  This is from the oxygen used during your procedure.  There is no need for concern and it should clear up in a day or so.  SYMPTOMS TO REPORT IMMEDIATELY:  Following lower endoscopy (colonoscopy):  Excessive amounts of blood in the stool  Significant tenderness or worsening of abdominal pains  Swelling of the abdomen that is new, acute  Fever of 100F or higher  For urgent or emergent issues, a gastroenterologist can be reached at any hour by calling [phone]. Do not use MyChart messaging for urgent concerns.    DIET:  We do recommend a small meal at first, but then you may proceed to your regular diet.  Drink plenty of fluids but you should avoid alcoholic beverages for 24  hours.  ACTIVITY:  You should plan to take it easy for the rest of today and you should NOT DRIVE or use heavy machinery until tomorrow (because of the sedation medicines used during the test).    FOLLOW UP: Our staff will call the number listed on your records the next business day following your procedure.  We will call around 7:15- 8:00 am to check on you and address any questions or concerns that you may have regarding the information given to you following your procedure. If we do not reach you, we will leave a message.     If any biopsies were taken you will be contacted by phone or by letter within the next 1-3 weeks.  Please call us at [phone] if you have not heard about the biopsies in 3 weeks.   SIGNATURES/CONFIDENTIALITY: You and/or your care partner have signed paperwork which will be entered into your electronic medical record.  These signatures attest to the fact that that the information above on your After Visit Summary has been reviewed and is understood.  Full responsibility of the confidentiality of this discharge information lies with you and/or your care-partner.

## 2022-10-05 ENCOUNTER — Telehealth: Payer: Self-pay

## 2022-10-05 NOTE — Telephone Encounter (Signed)
  Follow up Call-     10/04/2022    8:14 AM  Call back number  Post procedure Call Back phone  # [phone]  Permission to leave phone message Yes     Patient questions:  Do you have a fever, pain , or abdominal swelling? No. Pain Score  0 *  Have you tolerated food without any problems? Yes.    Have you been able to return to your normal activities? Yes.    Do you have any questions about your discharge instructions: Diet   No. Medications  No. Follow up visit  No.  Do you have questions or concerns about your Care? No.  Actions: * If pain score is 4 or above: No action needed, pain <4.

## 2022-10-06 ENCOUNTER — Encounter: Payer: Self-pay | Admitting: Internal Medicine

## 2022-11-01 ENCOUNTER — Other Ambulatory Visit (INDEPENDENT_AMBULATORY_CARE_PROVIDER_SITE_OTHER): Payer: Medicare PPO

## 2022-11-01 DIAGNOSIS — R7303 Prediabetes: Secondary | ICD-10-CM | POA: Diagnosis not present

## 2022-11-01 DIAGNOSIS — E538 Deficiency of other specified B group vitamins: Secondary | ICD-10-CM

## 2022-11-01 DIAGNOSIS — E559 Vitamin D deficiency, unspecified: Secondary | ICD-10-CM

## 2022-11-01 DIAGNOSIS — Z125 Encounter for screening for malignant neoplasm of prostate: Secondary | ICD-10-CM

## 2022-11-01 LAB — CBC WITH DIFFERENTIAL/PLATELET
Basophils Absolute: 0 10*3/uL (ref 0.0–0.1)
Basophils Relative: 0.2 % (ref 0.0–3.0)
Eosinophils Absolute: 0.3 10*3/uL (ref 0.0–0.7)
Eosinophils Relative: 2.8 % (ref 0.0–5.0)
HCT: 45.4 % (ref 39.0–52.0)
Hemoglobin: 15.5 g/dL (ref 13.0–17.0)
Lymphocytes Relative: 8.4 % — ABNORMAL LOW (ref 12.0–46.0)
Lymphs Abs: 0.9 10*3/uL (ref 0.7–4.0)
MCHC: 34.2 g/dL (ref 30.0–36.0)
MCV: 94.4 fl (ref 78.0–100.0)
Monocytes Absolute: 0.9 10*3/uL (ref 0.1–1.0)
Monocytes Relative: 7.9 % (ref 3.0–12.0)
Neutro Abs: 8.9 10*3/uL — ABNORMAL HIGH (ref 1.4–7.7)
Neutrophils Relative %: 80.7 % — ABNORMAL HIGH (ref 43.0–77.0)
Platelets: 223 10*3/uL (ref 150.0–400.0)
RBC: 4.81 Mil/uL (ref 4.22–5.81)
RDW: 12.7 % (ref 11.5–15.5)
WBC: 11 10*3/uL — ABNORMAL HIGH (ref 4.0–10.5)

## 2022-11-01 LAB — URINALYSIS, ROUTINE W REFLEX MICROSCOPIC
Bilirubin Urine: NEGATIVE
Hgb urine dipstick: NEGATIVE
Leukocytes,Ua: NEGATIVE
Nitrite: NEGATIVE
Specific Gravity, Urine: 1.015 (ref 1.000–1.030)
Urine Glucose: NEGATIVE
Urobilinogen, UA: 1 (ref 0.0–1.0)
pH: 7 (ref 5.0–8.0)

## 2022-11-01 LAB — HEPATIC FUNCTION PANEL
ALT: 13 U/L (ref 0–53)
AST: 16 U/L (ref 0–37)
Albumin: 4.6 g/dL (ref 3.5–5.2)
Alkaline Phosphatase: 80 U/L (ref 39–117)
Bilirubin, Direct: 0.1 mg/dL (ref 0.0–0.3)
Total Bilirubin: 0.7 mg/dL (ref 0.2–1.2)
Total Protein: 7.1 g/dL (ref 6.0–8.3)

## 2022-11-01 LAB — LIPID PANEL
Cholesterol: 122 mg/dL (ref 0–200)
HDL: 34.6 mg/dL — ABNORMAL LOW (ref >39.00)
NonHDL: 87.12
Total CHOL/HDL Ratio: 4
Triglycerides: 214 mg/dL — ABNORMAL HIGH (ref 0.0–149.0)
VLDL: 42.8 mg/dL — ABNORMAL HIGH (ref 0.0–40.0)

## 2022-11-01 LAB — MICROALBUMIN / CREATININE URINE RATIO
Creatinine,U: 195.1 mg/dL
Microalb Creat Ratio: 2.4 mg/g (ref 0.0–30.0)
Microalb, Ur: 4.7 mg/dL — ABNORMAL HIGH (ref 0.0–1.9)

## 2022-11-01 LAB — VITAMIN B12: Vitamin B-12: 280 pg/mL (ref 211–911)

## 2022-11-01 LAB — BASIC METABOLIC PANEL
BUN: 16 mg/dL (ref 6–23)
CO2: 29 mEq/L (ref 19–32)
Calcium: 9.8 mg/dL (ref 8.4–10.5)
Chloride: 102 mEq/L (ref 96–112)
Creatinine, Ser: 1.01 mg/dL (ref 0.40–1.50)
GFR: 74.37 mL/min (ref >60.00)
Glucose, Bld: 146 mg/dL — ABNORMAL HIGH (ref 70–99)
Potassium: 3.8 mEq/L (ref 3.5–5.1)
Sodium: 141 mEq/L (ref 135–145)

## 2022-11-01 LAB — LDL CHOLESTEROL, DIRECT: Direct LDL: 65 mg/dL

## 2022-11-01 LAB — HEMOGLOBIN A1C: Hgb A1c MFr Bld: 5.6 % (ref 4.6–6.5)

## 2022-11-01 LAB — TSH: TSH: 1.66 u[IU]/mL (ref 0.35–5.50)

## 2022-11-01 LAB — PSA: PSA: 1.24 ng/mL (ref 0.10–4.00)

## 2022-11-01 LAB — VITAMIN D 25 HYDROXY (VIT D DEFICIENCY, FRACTURES): VITD: 36.35 ng/mL (ref 30.00–100.00)

## 2022-11-04 ENCOUNTER — Other Ambulatory Visit: Payer: Self-pay | Admitting: Orthopedic Surgery

## 2022-11-05 ENCOUNTER — Encounter: Payer: Self-pay | Admitting: Podiatry

## 2022-11-05 ENCOUNTER — Ambulatory Visit: Payer: Medicare PPO | Admitting: Podiatry

## 2022-11-05 DIAGNOSIS — M7672 Peroneal tendinitis, left leg: Secondary | ICD-10-CM | POA: Diagnosis not present

## 2022-11-05 MED ORDER — TRIAMCINOLONE ACETONIDE 10 MG/ML IJ SUSP
10.0000 mg | Freq: Once | INTRAMUSCULAR | Status: AC
Start: 2022-11-05 — End: 2022-11-05
  Administered 2022-11-05: 10 mg via INTRA_ARTICULAR

## 2022-11-07 NOTE — Progress Notes (Signed)
Subjective:   Patient ID: Joel King, male   DOB: 72 y.o.   MRN: [id number]   HPI Patient presents stating that the outside of the left foot has been very sore recently   ROS      Objective:  Physical Exam  Neurovascular status intact inflammation of the peroneal tendon as it inserts into the base of the fifth metatarsal fluid buildup noted     Assessment:  Peroneal tendinitis left with inflammation at insertion     Plan:  H&P reviewed sterile prep injected the tendon complex at insertion 3 mg dexamethasone Kenalog 5 mg Xylocaine patient to be seen back

## 2022-11-08 ENCOUNTER — Encounter: Payer: Self-pay | Admitting: Internal Medicine

## 2022-11-08 ENCOUNTER — Ambulatory Visit (INDEPENDENT_AMBULATORY_CARE_PROVIDER_SITE_OTHER): Payer: Medicare PPO | Admitting: Internal Medicine

## 2022-11-08 VITALS — BP 128/72 | HR 63 | Temp 98.5°F | Ht 67.0 in | Wt 227.0 lb

## 2022-11-08 DIAGNOSIS — R7303 Prediabetes: Secondary | ICD-10-CM

## 2022-11-08 DIAGNOSIS — R3129 Other microscopic hematuria: Secondary | ICD-10-CM | POA: Diagnosis not present

## 2022-11-08 DIAGNOSIS — E559 Vitamin D deficiency, unspecified: Secondary | ICD-10-CM | POA: Diagnosis not present

## 2022-11-08 DIAGNOSIS — E538 Deficiency of other specified B group vitamins: Secondary | ICD-10-CM | POA: Diagnosis not present

## 2022-11-08 DIAGNOSIS — I10 Essential (primary) hypertension: Secondary | ICD-10-CM

## 2022-11-08 DIAGNOSIS — Z125 Encounter for screening for malignant neoplasm of prostate: Secondary | ICD-10-CM | POA: Diagnosis not present

## 2022-11-08 DIAGNOSIS — Z0001 Encounter for general adult medical examination with abnormal findings: Secondary | ICD-10-CM

## 2022-11-08 DIAGNOSIS — Z0000 Encounter for general adult medical examination without abnormal findings: Secondary | ICD-10-CM

## 2022-11-08 DIAGNOSIS — E7849 Other hyperlipidemia: Secondary | ICD-10-CM | POA: Diagnosis not present

## 2022-11-08 MED ORDER — ATENOLOL-CHLORTHALIDONE 100-25 MG PO TABS: 1.0000 | ORAL_TABLET | Freq: Every day | ORAL | 3 refills | Status: DC

## 2022-11-08 MED ORDER — FAMOTIDINE 20 MG PO TABS: 20.0000 mg | ORAL_TABLET | Freq: Two times a day (BID) | ORAL | 3 refills | Status: DC

## 2022-11-08 MED ORDER — POTASSIUM CHLORIDE ER 10 MEQ PO CPCR: 20.0000 meq | ORAL_CAPSULE | Freq: Every day | ORAL | 3 refills | Status: DC

## 2022-11-08 MED ORDER — GABAPENTIN 300 MG PO CAPS: 300.0000 mg | ORAL_CAPSULE | Freq: Three times a day (TID) | ORAL | 0 refills | Status: DC

## 2022-11-08 MED ORDER — BENAZEPRIL HCL 40 MG PO TABS: 40.0000 mg | ORAL_TABLET | Freq: Every day | ORAL | 3 refills | Status: DC

## 2022-11-08 MED ORDER — SIMVASTATIN 40 MG PO TABS: 40.0000 mg | ORAL_TABLET | Freq: Every day | ORAL | 3 refills | Status: DC

## 2022-11-08 MED ORDER — FINASTERIDE 5 MG PO TABS: 5.0000 mg | ORAL_TABLET | Freq: Every day | ORAL | 3 refills | Status: DC

## 2022-11-08 MED ORDER — AMLODIPINE BESYLATE 10 MG PO TABS: 10.0000 mg | ORAL_TABLET | Freq: Every day | ORAL | 3 refills | Status: DC

## 2022-11-08 NOTE — Assessment & Plan Note (Signed)
Age and sex appropriate education and counseling updated with regular exercise and diet Referrals for preventative services - for eye exam next month Immunizations addressed - none needed Smoking counseling  - none needed Evidence for depression or other mood disorder - none significant Most recent labs reviewed. I have personally reviewed and have noted: 1) the patient's medical and social history 2) The patient's current medications and supplements 3) The patient's height, weight, and BMI have been recorded in the chart

## 2022-11-08 NOTE — Assessment & Plan Note (Signed)
Last vitamin D Lab Results  Component Value Date   VD25OH 36.35 11/01/2022   Low, to start oral replacement

## 2022-11-08 NOTE — Assessment & Plan Note (Signed)
Asympt, for urology referral r/o malignancy

## 2022-11-08 NOTE — Assessment & Plan Note (Signed)
Lab Results  Component Value Date   HGBA1C 5.6 11/01/2022   Stable, pt to continue current medical treatment  - diet, wt control

## 2022-11-08 NOTE — Assessment & Plan Note (Signed)
Lab Results  Component Value Date   VITAMINB12 280 11/01/2022   Low, to start oral replacement - b12 1000 mcg qd

## 2022-11-08 NOTE — Assessment & Plan Note (Signed)
BP Readings from Last 3 Encounters:  11/08/22 128/72  10/04/22 (!) 116/54  01/22/22 133/77   Stable, pt to continue medical treatment norvasc 10 every day, tenoretic 100 25 every day, lotensin 40 qd

## 2022-11-08 NOTE — Assessment & Plan Note (Signed)
Lab Results  Component Value Date   LDLCALC 63 10/28/2021   Stable, pt to continue current statin zocor 40 qd

## 2022-11-08 NOTE — Progress Notes (Signed)
Patient ID: Joel King, male   DOB: 1950-07-30, 72 y.o.   MRN: [id number]         Chief Complaint:: wellness exam and Annual Exam (Right elbow has been in pain for about 2 weeks )  , low vit d, low b12, microhematuria       HPI:  Joel King is a 72 y.o. male here for wellness exam; has eye exam for next month.  O/w up to date                        Also sees Dr Charlsie Merles podiatry for feet, s/p left foot cortosone recnetly., also Dr Lajoyce Corners ortho with bilat knee cortisone. Denies urinary symptoms such as dysuria, frequency, urgency, flank pain, hematuria or n/v, fever, chills.  Pt denies chest pain, increased sob or doe, wheezing, orthopnea, PND, increased LE swelling, palpitations, dizziness or syncope.   Pt denies polydipsia, polyuria, or new focal neuro s/s.   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, now with pain to right medial elbow   Wt Readings from Last 3 Encounters:  11/08/22 227 lb (103 kg)  10/04/22 224 lb (101.6 kg)  09/20/22 224 lb 8 oz (101.8 kg)   BP Readings from Last 3 Encounters:  11/08/22 128/72  10/04/22 (!) 116/54  01/22/22 133/77   Immunization History  Administered Date(s) Administered   DTP 09/05/1997   Fluad Quad(high Dose 65+) 11/11/2018   Influenza Split 12/20/2012   Influenza, High Dose Seasonal PF 12/11/2020   Influenza,inj,Quad PF,6+ Mos 11/29/2013   Influenza-Unspecified 11/20/2015, 11/28/2016, 11/26/2018   PFIZER(Purple Top)SARS-COV-2 Vaccination 04/22/2019, 05/14/2019, 12/21/2019, 07/18/2020, 12/06/2020   Pneumococcal Conjugate-13 09/27/2015   Pneumococcal Polysaccharide-23 03/30/1995, 07/29/2008, 12/09/2015, 11/28/2016   Td 07/29/2008   Tdap 11/21/2014   Zoster Recombinant(Shingrix) 06/28/2022, 09/06/2022   Zoster, Live 11/21/2014   Health Maintenance Due  Topic Date Due   OPHTHALMOLOGY EXAM  05/11/2022   INFLUENZA VACCINE   10/28/2022      Past Medical History:  Diagnosis Date   ANXIETY 11/21/2006   Cancer (HCC)    skin cancer left cheek   DEPRESSION 05/31/2008   DIABETES MELLITUS, TYPE II 07/22/2007   no meds   Diarrhea 05/31/2008   DVT (deep venous thrombosis) (HCC) 02/08/2014   HYPERLIPIDEMIA 11/21/2006   HYPERTENSION 11/17/2006   INSOMNIA-SLEEP DISORDER-UNSPEC 07/21/2007   OBESITY, MILD 11/17/2006   PEPTIC ULCER DISEASE 07/22/2007   PEPTIC ULCER DISEASE, HX OF 11/17/2006   Post-operative nausea and vomiting    past 2 surgeries   SINUSITIS- ACUTE-NOS 12/08/2007   SKIN LESION 12/08/2007   Past Surgical History:  Procedure Laterality Date   inguinal herniorrhapy     bilat   s/p incarcerated recurrent ventral hernia     s/p meckel's diverticulum      reports that he has never smoked. He has never used smokeless tobacco. He reports that he does not drink alcohol and does not use drugs. family history includes COPD in his brother; Cancer in an other family member; Heart disease in his father and mother; Stroke in an other family member. Allergies  Allergen Reactions   Atorvastatin     Unsure of results   Pravastatin Sodium     Unsure of reaction   Rosuvastatin     Unsure of reaction   Current Outpatient Medications on File Prior to Visit  Medication Sig Dispense Refill   acetaminophen (TYLENOL) 500 MG tablet Take 500 mg by mouth every 6 (six) hours as needed.     aspirin 81 MG tablet Take 81 mg by mouth daily.     calcium-vitamin D (OSCAL WITH D) 500-5 MG-MCG tablet Take 1 tablet by mouth.     Multiple Vitamins-Minerals (CENTRUM SILVER PO) Take by mouth daily at 6 (six) AM.     Multiple Vitamins-Minerals (OCUVITE PRESERVISION PO) Take by mouth.     Na Sulfate-K Sulfate-Mg Sulf 17.5-3.13-1.6 GM/177ML SOLN See admin instructions.     No current facility-administered medications on file prior to visit.        ROS:  All others reviewed and negative.  Objective        PE:  BP 128/72 (BP Location:  Right Arm, Patient Position: Sitting, Cuff Size: Normal)   Pulse 63   Temp 98.5 F (36.9 C) (Oral)   Ht 5\' 7"  (1.702 m)   Wt 227 lb (103 kg)   SpO2 98%   BMI 35.55 kg/m                 Constitutional: Pt appears in NAD               HENT: Head: NCAT.                Right Ear: External ear normal.                 Left Ear: External ear normal.                Eyes: . Pupils are equal, round, and reactive to light. Conjunctivae and EOM are normal               Nose: without d/c or deformity               Neck: Neck supple. Gross normal ROM               Cardiovascular: Normal rate and regular rhythm.                 Pulmonary/Chest: Effort normal and breath sounds without rales or wheezing.                Abd:  Soft, NT, ND, + BS, no organomegaly               Neurological: Pt is alert. At baseline orientation, motor grossly intact; right medial elbow tender at the epicondyle               Skin: Skin is warm. No rashes, no other new lesions, LE edema - none               Psychiatric: Pt behavior is normal without agitation   Micro: none  Cardiac tracings I have personally interpreted today:  none  Pertinent Radiological findings (summarize): none   Lab Results  Component Value Date   WBC 11.0 (H) 11/01/2022   HGB 15.5 11/01/2022   HCT 45.4 11/01/2022   PLT 223.0 11/01/2022   GLUCOSE 146 (H) 11/01/2022   CHOL 122 11/01/2022   TRIG 214.0 (H) 11/01/2022   HDL 34.60 (L) 11/01/2022   LDLDIRECT 65.0 11/01/2022   LDLCALC 63 10/28/2021   ALT 13 11/01/2022   AST 16 11/01/2022   NA 141 11/01/2022   K 3.8 11/01/2022   CL 102 11/01/2022   CREATININE 1.01 11/01/2022   BUN 16 11/01/2022   CO2  29 11/01/2022   TSH 1.66 11/01/2022   PSA 1.24 11/01/2022   HGBA1C 5.6 11/01/2022   MICROALBUR 4.7 (H) 11/01/2022   Assessment/Plan:  Joel King is a 72 y.o. White or Caucasian [1] male with  has a past medical history of ANXIETY (11/21/2006), Cancer [facility]), DEPRESSION (05/31/2008),  DIABETES MELLITUS, TYPE II (07/22/2007), Diarrhea (05/31/2008), DVT (deep venous thrombosis) (HCC) (02/08/2014), HYPERLIPIDEMIA (11/21/2006), HYPERTENSION (11/17/2006), INSOMNIA-SLEEP DISORDER-UNSPEC (07/21/2007), OBESITY, MILD (11/17/2006), PEPTIC ULCER DISEASE (07/22/2007), PEPTIC ULCER DISEASE, HX OF (11/17/2006), Post-operative nausea and vomiting, SINUSITIS- ACUTE-NOS (12/08/2007), and SKIN LESION (12/08/2007).  Encounter for well adult exam with abnormal findings Age and sex appropriate education and counseling updated with regular exercise and diet Referrals for preventative services - for eye exam next month Immunizations addressed - none needed Smoking counseling  - none needed Evidence for depression or other mood disorder - none significant Most recent labs reviewed. I have personally reviewed and have noted: 1) the patient's medical and social history 2) The patient's current medications and supplements 3) The patient's height, weight, and BMI have been recorded in the chart   Pre-diabetes Lab Results  Component Value Date   HGBA1C 5.6 11/01/2022   Stable, pt to continue current medical treatment  - diet, wt control   Hyperlipidemia Lab Results  Component Value Date   LDLCALC 63 10/28/2021   Stable, pt to continue current statin zocor 40 qd   Essential hypertension BP Readings from Last 3 Encounters:  11/08/22 128/72  10/04/22 (!) 116/54  01/22/22 133/77   Stable, pt to continue medical treatment norvasc 10 every day, tenoretic 100 25 every day, lotensin 40 qd   Vitamin D deficiency Last vitamin D Lab Results  Component Value Date   VD25OH 36.35 11/01/2022   Low, to start oral replacement   B12 deficiency Lab Results  Component Value Date   VITAMINB12 280 11/01/2022   Low, to start oral replacement - b12 1000 mcg qd   Microhematuria Asympt, for urology referral r/o malignancy  Followup: Return in about 1 year (around 11/08/2023).  Oliver Barre, MD  11/08/2022 8:33 PM [facility]

## 2022-11-08 NOTE — Patient Instructions (Signed)
Ok to increase the Vit D3 to 4000 u per day  Please take all new medication as prescribed - the B12 1000 mcg per day for at least 6 months  Please continue all other medications as before, and refills have been done if requested.  Please have the pharmacy call with any other refills you may need.  Please continue your efforts at being more active, low cholesterol diet, and weight control.  You are otherwise up to date with prevention measures today.  Please keep your appointments with your specialists as you may have planned  You will be contacted regarding the referral for: Dr Laverle Patter urology  Please make an Appointment to return for your 1 year visit, or sooner if needed, with Lab testing by Appointment as well, to be done about 3-5 days before at the FIRST FLOOR Lab (so this is for TWO appointments - please see the scheduling desk as you leave)

## 2022-11-15 ENCOUNTER — Ambulatory Visit: Payer: Medicare PPO | Admitting: Orthopedic Surgery

## 2022-11-15 DIAGNOSIS — M170 Bilateral primary osteoarthritis of knee: Secondary | ICD-10-CM | POA: Diagnosis not present

## 2022-11-18 ENCOUNTER — Encounter: Payer: Self-pay | Admitting: Orthopedic Surgery

## 2022-11-18 DIAGNOSIS — M170 Bilateral primary osteoarthritis of knee: Secondary | ICD-10-CM

## 2022-11-18 NOTE — Progress Notes (Signed)
Office Visit Note   Patient: Joel King           Date of Birth: 1950-07-16           MRN: [id number] Visit Date: 11/15/2022              Requested by: [name], MD [address] [place],  [place] [address] PCP: [name], MD  Chief Complaint  Patient presents with   Right Knee - Follow-up   Left Knee - Follow-up      HPI: Patient is a 72 year old gentleman with osteoarthritis both knees.  Patient has had good interval relief from previous steroid injections.  Assessment & Plan: Visit Diagnoses:  1. Bilateral primary osteoarthritis of knee     Plan: Both knees were injected from the anterior medial portal he tolerated this well.  Follow-Up Instructions: No follow-ups on file.   Ortho Exam  Patient is alert, oriented, no adenopathy, well-dressed, normal affect, normal respiratory effort. Examination patient has a mild effusion of both knees there is no redness or cellulitis.  Collaterals and cruciates are stable.  There is crepitation with range of motion.  Imaging: No results found. No images are attached to the encounter.  Labs: Lab Results  Component Value Date   HGBA1C 5.6 11/01/2022   HGBA1C 5.8 10/28/2021   HGBA1C 5.6 10/27/2020     Lab Results  Component Value Date   ALBUMIN 4.6 11/01/2022   ALBUMIN 4.6 10/28/2021   ALBUMIN 4.3 10/27/2020    Lab Results  Component Value Date   MG 1.9 10/27/2020   MG 1.8 04/22/2020   Lab Results  Component Value Date   VD25OH 36.35 11/01/2022   VD25OH 33.31 10/28/2021   VD25OH 35.48 10/27/2020    No results found for: "PREALBUMIN"    Latest Ref Rng & Units 11/01/2022   12:54 PM 10/28/2021   11:22 AM 10/27/2020   12:20 PM  CBC EXTENDED  WBC 4.0 - 10.5 K/uL 11.0  11.2  8.0   RBC 4.22 - 5.81 Mil/uL 4.81  4.79  4.74   Hemoglobin 13.0 - 17.0 g/dL 95.6  21.3  08.6   HCT 39.0 - 52.0 % 45.4  44.8  43.8   Platelets 150.0 - 400.0 K/uL 223.0  201.0  196.0   NEUT# 1.4 - 7.7 K/uL 8.9  9.0  6.2    Lymph# 0.7 - 4.0 K/uL 0.9  1.0  0.9      There is no height or weight on file to calculate BMI.  Orders:  No orders of the defined types were placed in this encounter.  No orders of the defined types were placed in this encounter.    Procedures: Large Joint Inj: bilateral knee on 11/18/2022 5:31 PM Indications: pain and diagnostic evaluation Details: 22 G 1.5 in needle, anteromedial approach  Arthrogram: No  Outcome: tolerated well, no immediate complications Procedure, treatment alternatives, risks and benefits explained, specific risks discussed. Consent was given by the patient. Immediately prior to procedure a time out was called to verify the correct patient, procedure, equipment, support staff and site/side marked as required. Patient was prepped and draped in the usual sterile fashion.      Clinical Data: No additional findings.  ROS:  All other systems negative, except as noted in the HPI. Review of Systems  Objective: Vital Signs: There were no vitals taken for this visit.  Specialty Comments:  No specialty comments available.  PMFS History: Patient Active Problem List  Diagnosis Date Noted   B12 deficiency 11/08/2022   Microhematuria 11/08/2022   Vitamin D deficiency 10/23/2019   Vertigo 02/06/2019   Cough 03/15/2018   BPH (benign prostatic hyperplasia) 08/13/2017   Effusion, right knee 12/13/2016   Bilateral primary osteoarthritis of knee 08/24/2016   Plantar fasciitis 03/16/2016   Achilles tendinitis, right leg 03/16/2016   Rash 01/15/2016   Stasis dermatitis 01/15/2016   Chronic venous insufficiency 12/30/2015   Cellulitis 12/30/2015   Itching 12/09/2015   Dizziness 03/25/2015   Skin lesion of face 03/25/2015   Viral upper respiratory tract infection 01/14/2015   Neuritis of right lower extremity 08/20/2014   DVT (deep venous thrombosis) (HCC) 02/08/2014   Right knee pain 12/22/2013   Fatigue 11/02/2010   Encounter for well adult exam  with abnormal findings 11/01/2010   Diarrhea 05/31/2008   Pre-diabetes 07/22/2007   PEPTIC ULCER DISEASE 07/22/2007   INSOMNIA-SLEEP DISORDER-UNSPEC 07/21/2007   Hyperlipidemia 11/21/2006   Anxiety state 11/21/2006   OBESITY, MILD 11/17/2006   Essential hypertension 11/17/2006   Past Medical History:  Diagnosis Date   ANXIETY 11/21/2006   Cancer (HCC)    skin cancer left cheek   DEPRESSION 05/31/2008   DIABETES MELLITUS, TYPE II 07/22/2007   no meds   Diarrhea 05/31/2008   DVT (deep venous thrombosis) (HCC) 02/08/2014   HYPERLIPIDEMIA 11/21/2006   HYPERTENSION 11/17/2006   INSOMNIA-SLEEP DISORDER-UNSPEC 07/21/2007   OBESITY, MILD 11/17/2006   PEPTIC ULCER DISEASE 07/22/2007   PEPTIC ULCER DISEASE, HX OF 11/17/2006   Post-operative nausea and vomiting    past 2 surgeries   SINUSITIS- ACUTE-NOS 12/08/2007   SKIN LESION 12/08/2007    Family History  Problem Relation Age of Onset   Heart disease Mother    Heart disease Father    COPD Brother    Stroke Other    Cancer Other        prostate   Colon cancer Neg Hx    Colon polyps Neg Hx    Esophageal cancer Neg Hx    Rectal cancer Neg Hx    Stomach cancer Neg Hx     Past Surgical History:  Procedure Laterality Date   inguinal herniorrhapy     bilat   s/p incarcerated recurrent ventral hernia     s/p meckel's diverticulum     Social History   Occupational History   Not on file  Tobacco Use   Smoking status: Never   Smokeless tobacco: Never  Vaping Use   Vaping status: Never Used  Substance and Sexual Activity   Alcohol use: No   Drug use: No   Sexual activity: Not on file

## 2023-01-10 ENCOUNTER — Ambulatory Visit: Payer: Medicare PPO | Admitting: Internal Medicine

## 2023-01-10 ENCOUNTER — Encounter: Payer: Self-pay | Admitting: Internal Medicine

## 2023-01-10 VITALS — BP 122/70 | HR 79 | Temp 98.7°F | Ht 67.0 in | Wt 223.8 lb

## 2023-01-10 DIAGNOSIS — I10 Essential (primary) hypertension: Secondary | ICD-10-CM | POA: Diagnosis not present

## 2023-01-10 DIAGNOSIS — E559 Vitamin D deficiency, unspecified: Secondary | ICD-10-CM

## 2023-01-10 DIAGNOSIS — R7303 Prediabetes: Secondary | ICD-10-CM | POA: Diagnosis not present

## 2023-01-10 DIAGNOSIS — E538 Deficiency of other specified B group vitamins: Secondary | ICD-10-CM | POA: Diagnosis not present

## 2023-01-10 DIAGNOSIS — M545 Low back pain, unspecified: Secondary | ICD-10-CM

## 2023-01-10 DIAGNOSIS — R3129 Other microscopic hematuria: Secondary | ICD-10-CM | POA: Diagnosis not present

## 2023-01-10 MED ORDER — CELECOXIB 200 MG PO CAPS: 200.0000 mg | ORAL_CAPSULE | Freq: Two times a day (BID) | ORAL | 5 refills | Status: DC | PRN

## 2023-01-10 NOTE — Assessment & Plan Note (Signed)
No falls or truama or fever, likely underlying lumbar djd ddd - for celebrex 200 bid pr, f/u sport med

## 2023-01-10 NOTE — Progress Notes (Signed)
Patient ID: Joel King, male   DOB: 1950/12/28, 72 y.o.   MRN: [id number]        Chief Complaint: follow up worsennig low back pain       HPI:  EFREM King is a 72 y.o. male here with c/o 2 wks worsening lower back pain, with flares of pain with working his 20 hr job at [employer], really needs the job as o/w cannot afford to live; dull with sharp flares with bending and pushing; Pt denies chest pain, increased sob or doe, wheezing, orthopnea, PND, increased LE swelling, palpitations, dizziness or syncope.   Pt denies polydipsia, polyuria, or new focal neuro s/s.    Pt denies fever, wt loss, night sweats, loss of appetite, or other constitutional symptoms  No LE pain or weakness or falls. Denies urinary symptoms such as dysuria, frequency, urgency, flank pain, hematuria or n/v, fever, chills.  - has not seen blood in urine, has not been contacted per urology yet.       BP Readings from Last 3 Encounters:  01/10/23 122/70  11/08/22 128/72  10/04/22 (!) 116/54         Past Medical History:  Diagnosis Date   ANXIETY 11/21/2006   Cancer (HCC)    skin cancer left cheek   DEPRESSION 05/31/2008   DIABETES MELLITUS, TYPE II 07/22/2007   no meds   Diarrhea 05/31/2008   DVT (deep venous thrombosis) (HCC) 02/08/2014   HYPERLIPIDEMIA 11/21/2006   HYPERTENSION 11/17/2006   INSOMNIA-SLEEP DISORDER-UNSPEC 07/21/2007   OBESITY, MILD 11/17/2006   PEPTIC ULCER DISEASE 07/22/2007   PEPTIC ULCER DISEASE, HX OF 11/17/2006   Post-operative nausea and vomiting    past 2 surgeries   SINUSITIS- ACUTE-NOS 12/08/2007   SKIN LESION 12/08/2007   Past Surgical History:  Procedure Laterality Date   inguinal herniorrhapy     bilat   s/p incarcerated recurrent ventral hernia     s/p meckel's diverticulum      reports that he has never smoked. He has never used smokeless tobacco. He reports that he does not drink alcohol and does not use drugs. family history includes COPD in his brother; Cancer in an  other family member; Heart disease in his father and mother; Stroke in an other family member. Allergies  Allergen Reactions   Atorvastatin     Unsure of results   Pravastatin Sodium     Unsure of reaction   Rosuvastatin     Unsure of reaction   Current Outpatient Medications on File Prior to Visit  Medication Sig Dispense Refill   acetaminophen (TYLENOL) 500 MG tablet Take 500 mg by mouth every 6 (six) hours as needed.     amLODipine (NORVASC) 10 MG tablet Take 1 tablet (10 mg total) by mouth daily. 90 tablet 3   aspirin 81 MG tablet Take 81 mg by mouth daily.     atenolol-chlorthalidone (TENORETIC) 100-25 MG tablet Take 1 tablet by mouth daily. 90 tablet 3   benazepril (LOTENSIN) 40 MG tablet Take 1 tablet (40 mg total) by mouth daily. 90 tablet 3   calcium-vitamin D (OSCAL WITH D) 500-5 MG-MCG tablet Take 1 tablet by mouth.     famotidine (PEPCID) 20 MG tablet Take 1 tablet (20 mg total) by mouth 2 (two) times daily. 180 tablet 3   finasteride (PROSCAR) 5 MG tablet Take 1 tablet (5 mg total) by mouth daily. 90 tablet 3   gabapentin (NEURONTIN) 300 MG capsule Take 1 capsule (300 mg  total) by mouth 3 (three) times daily. 90 capsule 0   Multiple Vitamins-Minerals (CENTRUM SILVER PO) Take by mouth daily at 6 (six) AM.     Multiple Vitamins-Minerals (OCUVITE PRESERVISION PO) Take by mouth.     Na Sulfate-K Sulfate-Mg Sulf 17.5-3.13-1.6 GM/177ML SOLN See admin instructions.     potassium chloride (MICRO-K) 10 MEQ CR capsule Take 2 capsules (20 mEq total) by mouth daily. 180 capsule 3   simvastatin (ZOCOR) 40 MG tablet Take 1 tablet (40 mg total) by mouth at bedtime. 90 tablet 3   No current facility-administered medications on file prior to visit.        ROS:  All others reviewed and negative.  Objective        PE:  BP 122/70 (BP Location: Left Arm, Patient Position: Sitting, Cuff Size: Normal)   Pulse 79   Temp 98.7 F (37.1 C) (Oral)   Ht 5\' 7"  (1.702 m)   Wt 223 lb 12.8 oz  (101.5 kg)   SpO2 96%   BMI 35.05 kg/m                 Constitutional: Pt appears in NAD               HENT: Head: NCAT.                Right Ear: External ear normal.                 Left Ear: External ear normal.                Eyes: . Pupils are equal, round, and reactive to light. Conjunctivae and EOM are normal               Nose: without d/c or deformity               Neck: Neck supple. Gross normal ROM               Cardiovascular: Normal rate and regular rhythm.                 Pulmonary/Chest: Effort normal and breath sounds without rales or wheezing.                Abd:  Soft, NT, ND, + BS, no organomegaly               Neurological: Pt is alert. At baseline orientation, motor grossly intact               Skin: Skin is warm. No rashes, no other new lesions, LE edema - none               Psychiatric: Pt behavior is normal without agitation   Micro: none  Cardiac tracings I have personally interpreted today:  none  Pertinent Radiological findings (summarize): none   Lab Results  Component Value Date   WBC 11.0 (H) 11/01/2022   HGB 15.5 11/01/2022   HCT 45.4 11/01/2022   PLT 223.0 11/01/2022   GLUCOSE 146 (H) 11/01/2022   CHOL 122 11/01/2022   TRIG 214.0 (H) 11/01/2022   HDL 34.60 (L) 11/01/2022   LDLDIRECT 65.0 11/01/2022   LDLCALC 63 10/28/2021   ALT 13 11/01/2022   AST 16 11/01/2022   NA 141 11/01/2022   K 3.8 11/01/2022   CL 102 11/01/2022   CREATININE 1.01 11/01/2022   BUN 16 11/01/2022   CO2 29 11/01/2022   TSH 1.66 11/01/2022  PSA 1.24 11/01/2022   HGBA1C 5.6 11/01/2022   MICROALBUR 4.7 (H) 11/01/2022   Assessment/Plan:  Joel King is a 72 y.o. White or Caucasian [1] male with  has a past medical history of ANXIETY (11/21/2006), Cancer [facility]), DEPRESSION (05/31/2008), DIABETES MELLITUS, TYPE II (07/22/2007), Diarrhea (05/31/2008), DVT (deep venous thrombosis) (HCC) (02/08/2014), HYPERLIPIDEMIA (11/21/2006), HYPERTENSION (11/17/2006), INSOMNIA-SLEEP  DISORDER-UNSPEC (07/21/2007), OBESITY, MILD (11/17/2006), PEPTIC ULCER DISEASE (07/22/2007), PEPTIC ULCER DISEASE, HX OF (11/17/2006), Post-operative nausea and vomiting, SINUSITIS- ACUTE-NOS (12/08/2007), and SKIN LESION (12/08/2007).  Low back pain No falls or truama or fever, likely underlying lumbar djd ddd - for celebrex 200 bid pr, f/u sport med  Microhematuria Ok for CT renal, .refer urology  B12 deficiency Lab Results  Component Value Date   VITAMINB12 280 11/01/2022   Low, for start oral replacement - b12 1000 mcg qd   Vitamin D deficiency Last vitamin D Lab Results  Component Value Date   VD25OH 36.35 11/01/2022   Low, to start oral replacement   Essential hypertension BP Readings from Last 3 Encounters:  01/10/23 122/70  11/08/22 128/72  10/04/22 (!) 116/54   Stable, pt to continue medical treatment norvasc 10 every day, tenoretic 100 wt every day, lotensin 40 qd   Pre-diabetes Lab Results  Component Value Date   HGBA1C 5.6 11/01/2022   Stable, pt to continue current medical treatment  - diet,wt control  Followup: Return if symptoms worsen or fail to improve.  Oliver Barre, MD 01/10/2023 8:59 PM [facility]

## 2023-01-10 NOTE — Assessment & Plan Note (Signed)
Ok for CT renal, .refer urology

## 2023-01-10 NOTE — Assessment & Plan Note (Signed)
Lab Results  Component Value Date   HGBA1C 5.6 11/01/2022   Stable, pt to continue current medical treatment  - diet, wt control

## 2023-01-10 NOTE — Patient Instructions (Addendum)
Please take all new medication as prescribed - the celebrex 200 mg twice per day as needed for pain  Please continue all other medications as before, and refills have been done if requested.  Please have the pharmacy call with any other refills you may need.  Please keep your appointments with your specialists as you may have planned  You will be contacted regarding the referral for: CT renal (for microhematuria R31.29), and Urology -Dr Laverle Patter  Please also see Sports Medicine on the first floor in this building if your back pain persists

## 2023-01-10 NOTE — Assessment & Plan Note (Signed)
Lab Results  Component Value Date   VITAMINB12 280 11/01/2022   Low, for start oral replacement - b12 1000 mcg qd

## 2023-01-10 NOTE — Assessment & Plan Note (Signed)
BP Readings from Last 3 Encounters:  01/10/23 122/70  11/08/22 128/72  10/04/22 (!) 116/54   Stable, pt to continue medical treatment norvasc 10 every day, tenoretic 100 wt every day, lotensin 40 qd

## 2023-01-10 NOTE — Assessment & Plan Note (Signed)
Last vitamin D Lab Results  Component Value Date   VD25OH 36.35 11/01/2022   Low, to start oral replacement

## 2023-01-13 ENCOUNTER — Ambulatory Visit
Admission: RE | Admit: 2023-01-13 | Discharge: 2023-01-13 | Disposition: A | Discharge status: Home / Self Care | Payer: Medicare PPO | Source: Ambulatory Visit | Attending: Internal Medicine | Admitting: Internal Medicine

## 2023-01-13 DIAGNOSIS — E278 Other specified disorders of adrenal gland: Secondary | ICD-10-CM | POA: Diagnosis not present

## 2023-01-13 DIAGNOSIS — I700 Atherosclerosis of aorta: Secondary | ICD-10-CM | POA: Diagnosis not present

## 2023-01-13 DIAGNOSIS — R3129 Other microscopic hematuria: Secondary | ICD-10-CM

## 2023-02-09 ENCOUNTER — Other Ambulatory Visit: Payer: Self-pay | Admitting: Internal Medicine

## 2023-02-10 ENCOUNTER — Other Ambulatory Visit: Payer: Self-pay

## 2023-02-16 DIAGNOSIS — R3121 Asymptomatic microscopic hematuria: Secondary | ICD-10-CM | POA: Diagnosis not present

## 2023-02-16 DIAGNOSIS — R972 Elevated prostate specific antigen [PSA]: Secondary | ICD-10-CM | POA: Diagnosis not present

## 2023-02-16 DIAGNOSIS — N401 Enlarged prostate with lower urinary tract symptoms: Secondary | ICD-10-CM | POA: Diagnosis not present

## 2023-02-16 DIAGNOSIS — R351 Nocturia: Secondary | ICD-10-CM | POA: Diagnosis not present

## 2023-03-10 ENCOUNTER — Ambulatory Visit: Payer: Medicare PPO | Admitting: Orthopedic Surgery

## 2023-03-10 DIAGNOSIS — M170 Bilateral primary osteoarthritis of knee: Secondary | ICD-10-CM | POA: Diagnosis not present

## 2023-03-11 ENCOUNTER — Encounter: Payer: Self-pay | Admitting: Orthopedic Surgery

## 2023-03-11 DIAGNOSIS — M170 Bilateral primary osteoarthritis of knee: Secondary | ICD-10-CM | POA: Diagnosis not present

## 2023-03-11 MED ORDER — LIDOCAINE HCL 1 % IJ SOLN
5.0000 mL | INTRAMUSCULAR | Status: AC | PRN
  Administered 2023-03-11: 5 mL

## 2023-03-11 MED ORDER — METHYLPREDNISOLONE ACETATE 40 MG/ML IJ SUSP
40.0000 mg | INTRAMUSCULAR | Status: AC | PRN
  Administered 2023-03-11: 40 mg via INTRA_ARTICULAR

## 2023-03-11 MED ORDER — LIDOCAINE HCL 1 % IJ SOLN
5.0000 mL | INTRAMUSCULAR | Status: AC | PRN
Start: 2023-03-11 — End: 2023-03-11
  Administered 2023-03-11: 5 mL

## 2023-03-11 NOTE — Progress Notes (Signed)
Office Visit Note   Patient: Joel King           Date of Birth: Dec 29, 1950           MRN: [id number] Visit Date: 03/10/2023              Requested by: [name], MD [address] [place],  [place] [address] PCP: [name], MD  Chief Complaint  Patient presents with   Left Knee - Follow-up    S/p bilateral knee injections 11/15/2022   Right Knee - Follow-up      HPI: Patient is a 72 year old gentleman who is seen in follow-up for osteoarthritis both knees.  Patient has had good interval relief with steroid injections for the osteoarthritis.  Assessment & Plan: Visit Diagnoses:  1. Bilateral primary osteoarthritis of knee     Plan: Both knees were injected he tolerated this well.  Follow-Up Instructions: Return if symptoms worsen or fail to improve.   Ortho Exam  Patient is alert, oriented, no adenopathy, well-dressed, normal affect, normal respiratory effort. Examination patient has an antalgic gait he does have swelling of both knees there is no redness or cellulitis.  There is crepitation with range of motion of the patellofemoral joint.  Medial and lateral joint lines are tender to palpation.  No mechanical symptoms.  Imaging: No results found. No images are attached to the encounter.  Labs: Lab Results  Component Value Date   HGBA1C 5.6 11/01/2022   HGBA1C 5.8 10/28/2021   HGBA1C 5.6 10/27/2020     Lab Results  Component Value Date   ALBUMIN 4.6 11/01/2022   ALBUMIN 4.6 10/28/2021   ALBUMIN 4.3 10/27/2020    Lab Results  Component Value Date   MG 1.9 10/27/2020   MG 1.8 04/22/2020   Lab Results  Component Value Date   VD25OH 36.35 11/01/2022   VD25OH 33.31 10/28/2021   VD25OH 35.48 10/27/2020    No results found for: "PREALBUMIN"    Latest Ref Rng & Units 11/01/2022   12:54 PM 10/28/2021   11:22 AM 10/27/2020   12:20 PM  CBC EXTENDED  WBC 4.0 - 10.5 K/uL 11.0  11.2  8.0   RBC 4.22 - 5.81 Mil/uL 4.81  4.79  4.74   Hemoglobin  13.0 - 17.0 g/dL 19.1  47.8  29.5   HCT 39.0 - 52.0 % 45.4  44.8  43.8   Platelets 150.0 - 400.0 K/uL 223.0  201.0  196.0   NEUT# 1.4 - 7.7 K/uL 8.9  9.0  6.2   Lymph# 0.7 - 4.0 K/uL 0.9  1.0  0.9      There is no height or weight on file to calculate BMI.  Orders:  No orders of the defined types were placed in this encounter.  No orders of the defined types were placed in this encounter.    Procedures: Large Joint Inj: bilateral knee on 03/11/2023 2:33 PM Indications: pain and diagnostic evaluation Details: 22 G 1.5 in needle, anteromedial approach  Arthrogram: No  Medications (Right): 5 mL lidocaine 1 %; 40 mg methylPREDNISolone acetate 40 MG/ML Medications (Left): 5 mL lidocaine 1 %; 40 mg methylPREDNISolone acetate 40 MG/ML Outcome: tolerated well, no immediate complications Procedure, treatment alternatives, risks and benefits explained, specific risks discussed. Consent was given by the patient. Immediately prior to procedure a time out was called to verify the correct patient, procedure, equipment, support staff and site/side marked as required. Patient was prepped and draped in the  usual sterile fashion.      Clinical Data: No additional findings.  ROS:  All other systems negative, except as noted in the HPI. Review of Systems  Objective: Vital Signs: There were no vitals taken for this visit.  Specialty Comments:  No specialty comments available.  PMFS History: Patient Active Problem List   Diagnosis Date Noted   Low back pain 01/10/2023   B12 deficiency 11/08/2022   Microhematuria 11/08/2022   Vitamin D deficiency 10/23/2019   Vertigo 02/06/2019   Cough 03/15/2018   BPH (benign prostatic hyperplasia) 08/13/2017   Effusion, right knee 12/13/2016   Bilateral primary osteoarthritis of knee 08/24/2016   Plantar fasciitis 03/16/2016   Achilles tendinitis, right leg 03/16/2016   Rash 01/15/2016   Stasis dermatitis 01/15/2016   Chronic venous  insufficiency 12/30/2015   Cellulitis 12/30/2015   Itching 12/09/2015   Dizziness 03/25/2015   Skin lesion of face 03/25/2015   Viral upper respiratory tract infection 01/14/2015   Neuritis of right lower extremity 08/20/2014   DVT (deep venous thrombosis) (HCC) 02/08/2014   Right knee pain 12/22/2013   Fatigue 11/02/2010   Encounter for well adult exam with abnormal findings 11/01/2010   Diarrhea 05/31/2008   Pre-diabetes 07/22/2007   Peptic ulcer 07/22/2007   INSOMNIA-SLEEP DISORDER-UNSPEC 07/21/2007   Hyperlipidemia 11/21/2006   Anxiety state 11/21/2006   Overweight 11/17/2006   Essential hypertension 11/17/2006   Past Medical History:  Diagnosis Date   ANXIETY 11/21/2006   Cancer (HCC)    skin cancer left cheek   DEPRESSION 05/31/2008   DIABETES MELLITUS, TYPE II 07/22/2007   no meds   Diarrhea 05/31/2008   DVT (deep venous thrombosis) (HCC) 02/08/2014   HYPERLIPIDEMIA 11/21/2006   HYPERTENSION 11/17/2006   INSOMNIA-SLEEP DISORDER-UNSPEC 07/21/2007   OBESITY, MILD 11/17/2006   PEPTIC ULCER DISEASE 07/22/2007   PEPTIC ULCER DISEASE, HX OF 11/17/2006   Post-operative nausea and vomiting    past 2 surgeries   SINUSITIS- ACUTE-NOS 12/08/2007   SKIN LESION 12/08/2007    Family History  Problem Relation Age of Onset   Heart disease Mother    Heart disease Father    COPD Brother    Stroke Other    Cancer Other        prostate   Colon cancer Neg Hx    Colon polyps Neg Hx    Esophageal cancer Neg Hx    Rectal cancer Neg Hx    Stomach cancer Neg Hx     Past Surgical History:  Procedure Laterality Date   inguinal herniorrhapy     bilat   s/p incarcerated recurrent ventral hernia     s/p meckel's diverticulum     Social History   Occupational History   Not on file  Tobacco Use   Smoking status: Never   Smokeless tobacco: Never  Vaping Use   Vaping status: Never Used  Substance and Sexual Activity   Alcohol use: No   Drug use: No   Sexual activity: Not on file

## 2023-04-18 ENCOUNTER — Other Ambulatory Visit: Payer: Self-pay

## 2023-04-18 ENCOUNTER — Telehealth: Payer: Self-pay | Admitting: Internal Medicine

## 2023-04-18 MED ORDER — POTASSIUM CHLORIDE ER 10 MEQ PO CPCR: 20.0000 meq | ORAL_CAPSULE | Freq: Every day | ORAL | 3 refills | Status: AC

## 2023-04-18 MED ORDER — FAMOTIDINE 20 MG PO TABS: 20.0000 mg | ORAL_TABLET | Freq: Two times a day (BID) | ORAL | 3 refills | Status: AC

## 2023-04-18 MED ORDER — BENAZEPRIL HCL 40 MG PO TABS: 40.0000 mg | ORAL_TABLET | Freq: Every day | ORAL | 3 refills | Status: AC

## 2023-04-18 MED ORDER — SIMVASTATIN 40 MG PO TABS: 40.0000 mg | ORAL_TABLET | Freq: Every day | ORAL | 3 refills | Status: DC

## 2023-04-18 MED ORDER — AMLODIPINE BESYLATE 10 MG PO TABS: 10.0000 mg | ORAL_TABLET | Freq: Every day | ORAL | 3 refills | Status: AC

## 2023-04-18 MED ORDER — GABAPENTIN 300 MG PO CAPS: 300.0000 mg | ORAL_CAPSULE | Freq: Three times a day (TID) | ORAL | 11 refills | Status: AC

## 2023-04-18 MED ORDER — ATENOLOL-CHLORTHALIDONE 100-25 MG PO TABS: 1.0000 | ORAL_TABLET | Freq: Every day | ORAL | 3 refills | Status: AC

## 2023-04-18 MED ORDER — FINASTERIDE 5 MG PO TABS: 5.0000 mg | ORAL_TABLET | Freq: Every day | ORAL | 3 refills | Status: AC

## 2023-04-18 NOTE — Telephone Encounter (Signed)
Refills sent to new pharmacy.  

## 2023-04-18 NOTE — Telephone Encounter (Signed)
Patient has changed insurance and needs 90 day supplies of the below medications sent to the new mail order pharmacy.   Prescription Request  04/18/2023  LOV: 01/10/2023  What is the name of the medication or equipment? amLODipine (NORVASC) 10 MG tablet  famotidine (PEPCID) 20 MG tablet  benazepril (LOTENSIN) 40 MG tablet gabapentin (NEURONTIN) 300 MG capsule  finasteride (PROSCAR) 5 MG tablet   potassium chloride (MICRO-K) 10 MEQ CR capsule  atenolol-chlorthalidone (TENORETIC) 100-25 MG tablet simvastatin (ZOCOR) 40 MG tablet  Have you contacted your pharmacy to request a refill? No   Which pharmacy would you like this sent to?  [facility]    Patient notified that their request is being sent to the clinical staff for review and that they should receive a response within 2 business days.   Please advise at Mobile [phone] (mobile)

## 2023-04-20 ENCOUNTER — Other Ambulatory Visit: Payer: Self-pay | Admitting: Internal Medicine

## 2023-04-22 ENCOUNTER — Telehealth: Payer: Self-pay | Admitting: Internal Medicine

## 2023-04-22 LAB — HM DIABETES EYE EXAM

## 2023-04-22 NOTE — Telephone Encounter (Signed)
Oh yes, this should be fine - ok to dispense, thanks

## 2023-04-22 NOTE — Telephone Encounter (Signed)
Copied from CRM [id number]. Topic: Clinical - Prescription Issue >> Apr 22, 2023 11:53 AM Corin V wrote: Reason for CRM: Pharmacy tech with [facility] calling as patient has reported an allergy/sensitivity to statin medications but they received an Rx for simvastatin (ZOCOR) 40 MG tablet. Please confirm with Dr. Jonny Ruiz that patient is safe to take this Rx and let [facility] know. Reference number: [id number]

## 2023-04-25 NOTE — Telephone Encounter (Signed)
Called and gave okay to pharmacy.

## 2023-05-17 DIAGNOSIS — Z860100 Personal history of colon polyps, unspecified: Secondary | ICD-10-CM | POA: Diagnosis not present

## 2023-05-17 DIAGNOSIS — E669 Obesity, unspecified: Secondary | ICD-10-CM | POA: Diagnosis not present

## 2023-05-17 DIAGNOSIS — I10 Essential (primary) hypertension: Secondary | ICD-10-CM | POA: Diagnosis not present

## 2023-05-17 DIAGNOSIS — E785 Hyperlipidemia, unspecified: Secondary | ICD-10-CM | POA: Diagnosis not present

## 2023-05-17 DIAGNOSIS — N401 Enlarged prostate with lower urinary tract symptoms: Secondary | ICD-10-CM | POA: Diagnosis not present

## 2023-05-17 DIAGNOSIS — Z6833 Body mass index (BMI) 33.0-33.9, adult: Secondary | ICD-10-CM | POA: Diagnosis not present

## 2023-05-17 DIAGNOSIS — R7303 Prediabetes: Secondary | ICD-10-CM | POA: Diagnosis not present

## 2023-05-17 DIAGNOSIS — Z008 Encounter for other general examination: Secondary | ICD-10-CM | POA: Diagnosis not present

## 2023-07-05 ENCOUNTER — Encounter: Payer: Self-pay | Admitting: Internal Medicine

## 2023-07-05 ENCOUNTER — Ambulatory Visit (INDEPENDENT_AMBULATORY_CARE_PROVIDER_SITE_OTHER)

## 2023-07-05 ENCOUNTER — Ambulatory Visit (INDEPENDENT_AMBULATORY_CARE_PROVIDER_SITE_OTHER): Admitting: Internal Medicine

## 2023-07-05 VITALS — BP 124/68 | HR 60 | Temp 98.2°F | Ht 67.0 in | Wt 220.0 lb

## 2023-07-05 DIAGNOSIS — R7303 Prediabetes: Secondary | ICD-10-CM

## 2023-07-05 DIAGNOSIS — I10 Essential (primary) hypertension: Secondary | ICD-10-CM

## 2023-07-05 DIAGNOSIS — E538 Deficiency of other specified B group vitamins: Secondary | ICD-10-CM

## 2023-07-05 DIAGNOSIS — E559 Vitamin D deficiency, unspecified: Secondary | ICD-10-CM

## 2023-07-05 DIAGNOSIS — M25511 Pain in right shoulder: Secondary | ICD-10-CM | POA: Diagnosis not present

## 2023-07-05 DIAGNOSIS — M19011 Primary osteoarthritis, right shoulder: Secondary | ICD-10-CM | POA: Diagnosis not present

## 2023-07-05 NOTE — Assessment & Plan Note (Signed)
 Post fall, exam benign, for salon paz, xray, and consider PT but declines for now, consider also seeing sport med with shoulder ultrasound

## 2023-07-05 NOTE — Assessment & Plan Note (Signed)
Lab Results  Component Value Date   VITAMINB12 280 11/01/2022   Low, to start oral replacement - b12 1000 mcg qd

## 2023-07-05 NOTE — Patient Instructions (Addendum)
 Please continue all other medications as before, and refills have been done if requested.  Please have the pharmacy call with any other refills you may need.  Please continue your efforts at being more active, low cholesterol diet, and weight control.  Please keep your appointments with your specialists as you may have planned  Please go to the XRAY Department in the first floor for the x-ray testing  You will be contacted by phone if any changes need to be made immediately.  Otherwise, you will receive a letter about your results with an explanation, but please check with MyChart first.  Please make an Appointment to return in 4 months, or sooner if needed, also with Lab Appointment for testing done 3-5 days before at the FIRST FLOOR Lab (so this is for TWO appointments - please see the scheduling desk as you leave)

## 2023-07-05 NOTE — Assessment & Plan Note (Signed)
Lab Results  Component Value Date   HGBA1C 5.6 11/01/2022   Stable, pt to continue current medical treatment  - diet, wt control

## 2023-07-05 NOTE — Assessment & Plan Note (Signed)
Last vitamin D Lab Results  Component Value Date   VD25OH 36.35 11/01/2022   Low, to start oral replacement

## 2023-07-05 NOTE — Progress Notes (Signed)
 Patient ID: Joel King, male   DOB: 04-Aug-1950, 73 y.o.   MRN: [id number]        Chief Complaint: follow up right shoulder pain, preDM, htn, low vit d and b12       HPI:  Joel King is a 73 y.o. male here with c/o fall off a step stool only 1 foot but landed on lateral right upper arm and shoulder x 3 mo ago, had bruising now resolved, but still stiff and sore.  Does have FROM and has not missed any work.  Pt denies chest pain, increased sob or doe, wheezing, orthopnea, PND, increased LE swelling, palpitations, dizziness or syncope.   Pt denies polydipsia, polyuria, or new focal neuro s/s.    Pt denies fever, wt loss, night sweats, loss of appetite, or other constitutional symptoms         Wt Readings from Last 3 Encounters:  07/05/23 220 lb (99.8 kg)  01/10/23 223 lb 12.8 oz (101.5 kg)  11/08/22 227 lb (103 kg)   BP Readings from Last 3 Encounters:  07/05/23 124/68  01/10/23 122/70  11/08/22 128/72         Past Medical History:  Diagnosis Date   ANXIETY 11/21/2006   Cancer (HCC)    skin cancer left cheek   DEPRESSION 05/31/2008   DIABETES MELLITUS, TYPE II 07/22/2007   no meds   Diarrhea 05/31/2008   DVT (deep venous thrombosis) (HCC) 02/08/2014   HYPERLIPIDEMIA 11/21/2006   HYPERTENSION 11/17/2006   INSOMNIA-SLEEP DISORDER-UNSPEC 07/21/2007   OBESITY, MILD 11/17/2006   PEPTIC ULCER DISEASE 07/22/2007   PEPTIC ULCER DISEASE, HX OF 11/17/2006   Post-operative nausea and vomiting    past 2 surgeries   SINUSITIS- ACUTE-NOS 12/08/2007   SKIN LESION 12/08/2007   Past Surgical History:  Procedure Laterality Date   inguinal herniorrhapy     bilat   s/p incarcerated recurrent ventral hernia     s/p meckel's diverticulum      reports that he has never smoked. He has never used smokeless tobacco. He reports that he does not drink alcohol and does not use drugs. family history includes COPD in his brother; Cancer in an other family member; Heart disease in his father and  mother; Stroke in an other family member. Allergies  Allergen Reactions   Atorvastatin     Unsure of results   Pravastatin Sodium     Unsure of reaction   Rosuvastatin     Unsure of reaction   Current Outpatient Medications on File Prior to Visit  Medication Sig Dispense Refill   acetaminophen (TYLENOL) 500 MG tablet Take 500 mg by mouth every 6 (six) hours as needed.     amLODipine (NORVASC) 10 MG tablet Take 1 tablet (10 mg total) by mouth daily. 90 tablet 3   aspirin 81 MG tablet Take 81 mg by mouth daily.     atenolol-chlorthalidone (TENORETIC) 100-25 MG tablet Take 1 tablet by mouth daily. 90 tablet 3   benazepril (LOTENSIN) 40 MG tablet Take 1 tablet (40 mg total) by mouth daily. 90 tablet 3   calcium-vitamin D (OSCAL WITH D) 500-5 MG-MCG tablet Take 1 tablet by mouth.     celecoxib (CELEBREX) 200 MG capsule Take 1 capsule (200 mg total) by mouth 2 (two) times daily as needed. 60 capsule 5   famotidine (PEPCID) 20 MG tablet Take 1 tablet (20 mg total) by mouth 2 (two) times daily. 180 tablet 3   finasteride (PROSCAR) 5 MG  tablet Take 1 tablet (5 mg total) by mouth daily. 90 tablet 3   gabapentin (NEURONTIN) 300 MG capsule Take 1 capsule (300 mg total) by mouth 3 (three) times daily. 90 capsule 11   Multiple Vitamins-Minerals (CENTRUM SILVER PO) Take by mouth daily at 6 (six) AM.     Multiple Vitamins-Minerals (OCUVITE PRESERVISION PO) Take by mouth.     Na Sulfate-K Sulfate-Mg Sulf 17.5-3.13-1.6 GM/177ML SOLN See admin instructions.     potassium chloride (MICRO-K) 10 MEQ CR capsule Take 2 capsules (20 mEq total) by mouth daily. 180 capsule 3   simvastatin (ZOCOR) 40 MG tablet TAKE 1 TABLET AT BEDTIME. 90 tablet 3   No current facility-administered medications on file prior to visit.        ROS:  All others reviewed and negative.  Objective        PE:  BP 124/68 (BP Location: Left Arm, Patient Position: Sitting, Cuff Size: Normal)   Pulse 60   Temp 98.2 F (36.8 C) (Oral)    Ht 5\' 7"  (1.702 m)   Wt 220 lb (99.8 kg)   SpO2 98%   BMI 34.46 kg/m                 Constitutional: Pt appears in NAD               HENT: Head: NCAT.                Right Ear: External ear normal.                 Left Ear: External ear normal.                Eyes: . Pupils are equal, round, and reactive to light. Conjunctivae and EOM are normal               Nose: without d/c or deformity               Neck: Neck supple. Gross normal ROM               Cardiovascular: Normal rate and regular rhythm.                 Pulmonary/Chest: Effort normal and breath sounds without rales or wheezing.                Abd:  Soft, NT, ND, + BS, no organomegaly               Neurological: Pt is alert. At baseline orientation, motor grossly intact               Skin: Skin is warm. No rashes, no other new lesions, LE edema - none;    right shoulder with mild deltoid tender but no swelling , mass or overlying skin change; has near FROM which is baseline for him and o/w neurovasc intact               Psychiatric: Pt behavior is normal without agitation   Micro: none  Cardiac tracings I have personally interpreted today:  none  Pertinent Radiological findings (summarize): none   Lab Results  Component Value Date   WBC 11.0 (H) 11/01/2022   HGB 15.5 11/01/2022   HCT 45.4 11/01/2022   PLT 223.0 11/01/2022   GLUCOSE 146 (H) 11/01/2022   CHOL 122 11/01/2022   TRIG 214.0 (H) 11/01/2022   HDL 34.60 (L) 11/01/2022   LDLDIRECT 65.0 11/01/2022   LDLCALC 63  10/28/2021   ALT 13 11/01/2022   AST 16 11/01/2022   NA 141 11/01/2022   K 3.8 11/01/2022   CL 102 11/01/2022   CREATININE 1.01 11/01/2022   BUN 16 11/01/2022   CO2 29 11/01/2022   TSH 1.66 11/01/2022   PSA 1.24 11/01/2022   HGBA1C 5.6 11/01/2022   MICROALBUR 4.7 (H) 11/01/2022   Assessment/Plan:  Joel King is a 73 y.o. White or Caucasian [1] male with  has a past medical history of ANXIETY (11/21/2006), Cancer [facility]), DEPRESSION  (05/31/2008), DIABETES MELLITUS, TYPE II (07/22/2007), Diarrhea (05/31/2008), DVT (deep venous thrombosis) (HCC) (02/08/2014), HYPERLIPIDEMIA (11/21/2006), HYPERTENSION (11/17/2006), INSOMNIA-SLEEP DISORDER-UNSPEC (07/21/2007), OBESITY, MILD (11/17/2006), PEPTIC ULCER DISEASE (07/22/2007), PEPTIC ULCER DISEASE, HX OF (11/17/2006), Post-operative nausea and vomiting, SINUSITIS- ACUTE-NOS (12/08/2007), and SKIN LESION (12/08/2007).  Pre-diabetes Lab Results  Component Value Date   HGBA1C 5.6 11/01/2022   Stable, pt to continue current medical treatment  - diet, wt control   Essential hypertension BP Readings from Last 3 Encounters:  07/05/23 124/68  01/10/23 122/70  11/08/22 128/72   Stable, pt to continue medical treatment norvasc 10 every day, tenoretic 100 25 every day, lotensin 40 qd   Vitamin D deficiency Last vitamin D Lab Results  Component Value Date   VD25OH 36.35 11/01/2022   Low, to start oral replacement   B12 deficiency Lab Results  Component Value Date   VITAMINB12 280 11/01/2022   Low, to start oral replacement - b12 1000 mcg qd   Right shoulder pain Post fall, exam benign, for salon paz, xray, and consider PT but declines for now, consider also seeing sport med with shoulder ultrasound Followup: Return in about 4 months (around 11/04/2023).  Oliver Barre, MD 07/05/2023 1:01 PM [facility]

## 2023-07-05 NOTE — Assessment & Plan Note (Addendum)
 BP Readings from Last 3 Encounters:  07/05/23 124/68  01/10/23 122/70  11/08/22 128/72   Stable, pt to continue medical treatment norvasc 10 every day, tenoretic 100 25 every day, lotensin 40 qd

## 2023-07-07 ENCOUNTER — Encounter: Payer: Self-pay | Admitting: Internal Medicine

## 2023-07-12 NOTE — Progress Notes (Unsigned)
    Ben Jackson D.[facility] [address] [place] [address] Phone: [phone]   Assessment and Plan:     There are no diagnoses linked to this encounter.  ***   Pertinent previous records reviewed include ***    Follow Up: ***     Subjective:   I, Joel King, am serving as a Neurosurgeon for Doctor Ulysees Gander  Chief Complaint: shoulder pain   HPI:   07/13/2023 Patient is a 73 year old male with shoulder pain. Patient states   Relevant Historical Information: ***  Additional pertinent review of systems negative.   Current Outpatient Medications:    acetaminophen (TYLENOL) 500 MG tablet, Take 500 mg by mouth every 6 (six) hours as needed., Disp: , Rfl:    amLODipine (NORVASC) 10 MG tablet, Take 1 tablet (10 mg total) by mouth daily., Disp: 90 tablet, Rfl: 3   aspirin 81 MG tablet, Take 81 mg by mouth daily., Disp: , Rfl:    atenolol-chlorthalidone (TENORETIC) 100-25 MG tablet, Take 1 tablet by mouth daily., Disp: 90 tablet, Rfl: 3   benazepril (LOTENSIN) 40 MG tablet, Take 1 tablet (40 mg total) by mouth daily., Disp: 90 tablet, Rfl: 3   calcium-vitamin D (OSCAL WITH D) 500-5 MG-MCG tablet, Take 1 tablet by mouth., Disp: , Rfl:    celecoxib (CELEBREX) 200 MG capsule, Take 1 capsule (200 mg total) by mouth 2 (two) times daily as needed., Disp: 60 capsule, Rfl: 5   famotidine (PEPCID) 20 MG tablet, Take 1 tablet (20 mg total) by mouth 2 (two) times daily., Disp: 180 tablet, Rfl: 3   finasteride (PROSCAR) 5 MG tablet, Take 1 tablet (5 mg total) by mouth daily., Disp: 90 tablet, Rfl: 3   gabapentin (NEURONTIN) 300 MG capsule, Take 1 capsule (300 mg total) by mouth 3 (three) times daily., Disp: 90 capsule, Rfl: 11   Multiple Vitamins-Minerals (CENTRUM SILVER PO), Take by mouth daily at 6 (six) AM., Disp: , Rfl:    Multiple Vitamins-Minerals (OCUVITE PRESERVISION PO), Take by mouth., Disp: , Rfl:    Na Sulfate-K Sulfate-Mg Sulf  17.5-3.13-1.6 GM/177ML SOLN, See admin instructions., Disp: , Rfl:    potassium chloride (MICRO-K) 10 MEQ CR capsule, Take 2 capsules (20 mEq total) by mouth daily., Disp: 180 capsule, Rfl: 3   simvastatin (ZOCOR) 40 MG tablet, TAKE 1 TABLET AT BEDTIME., Disp: 90 tablet, Rfl: 3   Objective:     There were no vitals filed for this visit.    There is no height or weight on file to calculate BMI.    Physical Exam:    ***   Electronically signed by:  Marshall Skeeter D.[facility] 7:35 AM 07/12/23

## 2023-07-13 ENCOUNTER — Ambulatory Visit: Admitting: Sports Medicine

## 2023-07-13 VITALS — BP 126/78 | HR 73 | Ht 67.0 in | Wt 217.0 lb

## 2023-07-13 DIAGNOSIS — G8929 Other chronic pain: Secondary | ICD-10-CM | POA: Diagnosis not present

## 2023-07-13 DIAGNOSIS — M25511 Pain in right shoulder: Secondary | ICD-10-CM

## 2023-07-13 NOTE — Patient Instructions (Addendum)
 Tylenol [phone] mg 2-3 times a day for pain relief  Can use Celebrex 200 mg 2x a day as needed recommend limiting dose 1-2 times per week  Shoulder HEP  4 week follow up

## 2023-07-14 ENCOUNTER — Ambulatory Visit: Payer: Medicare PPO | Admitting: Orthopedic Surgery

## 2023-07-14 DIAGNOSIS — M170 Bilateral primary osteoarthritis of knee: Secondary | ICD-10-CM | POA: Diagnosis not present

## 2023-07-19 ENCOUNTER — Encounter: Payer: Self-pay | Admitting: Orthopedic Surgery

## 2023-07-19 DIAGNOSIS — M170 Bilateral primary osteoarthritis of knee: Secondary | ICD-10-CM

## 2023-07-19 MED ORDER — LIDOCAINE HCL 1 % IJ SOLN
5.0000 mL | INTRAMUSCULAR | Status: AC | PRN
Start: 2023-07-19 — End: 2023-07-19
  Administered 2023-07-19: 5 mL

## 2023-07-19 MED ORDER — LIDOCAINE HCL 1 % IJ SOLN
5.0000 mL | INTRAMUSCULAR | Status: AC | PRN
  Administered 2023-07-19: 5 mL

## 2023-07-19 MED ORDER — METHYLPREDNISOLONE ACETATE 40 MG/ML IJ SUSP
40.0000 mg | INTRAMUSCULAR | Status: AC | PRN
Start: 2023-07-19 — End: 2023-07-19
  Administered 2023-07-19: 40 mg via INTRA_ARTICULAR

## 2023-07-19 MED ORDER — METHYLPREDNISOLONE ACETATE 40 MG/ML IJ SUSP
40.0000 mg | INTRAMUSCULAR | Status: AC | PRN
  Administered 2023-07-19: 40 mg via INTRA_ARTICULAR

## 2023-07-19 NOTE — Progress Notes (Signed)
 Office Visit Note   Patient: Joel King           Date of Birth: 1950-04-30           MRN: [id number] Visit Date: 07/14/2023              Requested by: Roslyn Coombe, MD [address] [place],  [place] [address] PCP: Roslyn Coombe, MD  Chief Complaint  Patient presents with   Right Knee - Follow-up    S/p injections 10/2022   Left Knee - Follow-up      HPI: Patient is a 73 year old gentleman with osteoarthritis both knees.  Patient has had interval relief with steroid injections in both knees August 19.  Patient presents with recurrent pain in his knees.  Assessment & Plan: Visit Diagnoses:  1. Bilateral primary osteoarthritis of knee     Plan: Both knees were injected he tolerated this well.  Follow-Up Instructions: Return if symptoms worsen or fail to improve.   Ortho Exam  Patient is alert, oriented, no adenopathy, well-dressed, normal affect, normal respiratory effort. Patient is status post injection for the right shoulder from a fall he states this is doing better.  Examination of his knees there is no effusion no cellulitis.  Patient has crepitation with range of motion.  Imaging: No results found. No images are attached to the encounter.  Labs: Lab Results  Component Value Date   HGBA1C 5.6 11/01/2022   HGBA1C 5.8 10/28/2021   HGBA1C 5.6 10/27/2020     Lab Results  Component Value Date   ALBUMIN 4.6 11/01/2022   ALBUMIN 4.6 10/28/2021   ALBUMIN 4.3 10/27/2020    Lab Results  Component Value Date   MG 1.9 10/27/2020   MG 1.8 04/22/2020   Lab Results  Component Value Date   VD25OH 36.35 11/01/2022   VD25OH 33.31 10/28/2021   VD25OH 35.48 10/27/2020    No results found for: "PREALBUMIN"    Latest Ref Rng & Units 11/01/2022   12:54 PM 10/28/2021   11:22 AM 10/27/2020   12:20 PM  CBC EXTENDED  WBC 4.0 - 10.5 K/uL 11.0  11.2  8.0   RBC 4.22 - 5.81 Mil/uL 4.81  4.79  4.74   Hemoglobin 13.0 - 17.0 g/dL 78.4  69.6  29.5   HCT 39.0 -  52.0 % 45.4  44.8  43.8   Platelets 150.0 - 400.0 K/uL 223.0  201.0  196.0   NEUT# 1.4 - 7.7 K/uL 8.9  9.0  6.2   Lymph# 0.7 - 4.0 K/uL 0.9  1.0  0.9      There is no height or weight on file to calculate BMI.  Orders:  No orders of the defined types were placed in this encounter.  No orders of the defined types were placed in this encounter.    Procedures: Large Joint Inj: bilateral knee on 07/19/2023 12:39 PM Indications: pain and diagnostic evaluation Details: 22 G 1.5 in needle, anteromedial approach  Arthrogram: No  Medications (Right): 5 mL lidocaine  1 %; 40 mg methylPREDNISolone  acetate 40 MG/ML Medications (Left): 5 mL lidocaine  1 %; 40 mg methylPREDNISolone  acetate 40 MG/ML Outcome: tolerated well, no immediate complications Procedure, treatment alternatives, risks and benefits explained, specific risks discussed. Consent was given by the patient. Immediately prior to procedure a time out was called to verify the correct patient, procedure, equipment, support staff and site/side marked as required. Patient was prepped and draped in the usual sterile fashion.  Clinical Data: No additional findings.  ROS:  All other systems negative, except as noted in the HPI. Review of Systems  Objective: Vital Signs: There were no vitals taken for this visit.  Specialty Comments:  No specialty comments available.  PMFS History: Patient Active Problem List   Diagnosis Date Noted   Right shoulder pain 07/05/2023   Low back pain 01/10/2023   B12 deficiency 11/08/2022   Microhematuria 11/08/2022   Vitamin D  deficiency 10/23/2019   Vertigo 02/06/2019   Cough 03/15/2018   BPH (benign prostatic hyperplasia) 08/13/2017   Effusion, right knee 12/13/2016   Bilateral primary osteoarthritis of knee 08/24/2016   Plantar fasciitis 03/16/2016   Achilles tendinitis, right leg 03/16/2016   Rash 01/15/2016   Stasis dermatitis 01/15/2016   Chronic venous insufficiency 12/30/2015    Cellulitis 12/30/2015   Itching 12/09/2015   Dizziness 03/25/2015   Skin lesion of face 03/25/2015   Viral upper respiratory tract infection 01/14/2015   Neuritis of right lower extremity 08/20/2014   DVT (deep venous thrombosis) (HCC) 02/08/2014   Right knee pain 12/22/2013   Fatigue 11/02/2010   Encounter for well adult exam with abnormal findings 11/01/2010   Diarrhea 05/31/2008   Pre-diabetes 07/22/2007   Peptic ulcer 07/22/2007   INSOMNIA-SLEEP DISORDER-UNSPEC 07/21/2007   Hyperlipidemia 11/21/2006   Anxiety state 11/21/2006   Overweight 11/17/2006   Essential hypertension 11/17/2006   Past Medical History:  Diagnosis Date   ANXIETY 11/21/2006   Cancer (HCC)    skin cancer left cheek   DEPRESSION 05/31/2008   DIABETES MELLITUS, TYPE II 07/22/2007   no meds   Diarrhea 05/31/2008   DVT (deep venous thrombosis) (HCC) 02/08/2014   HYPERLIPIDEMIA 11/21/2006   HYPERTENSION 11/17/2006   INSOMNIA-SLEEP DISORDER-UNSPEC 07/21/2007   OBESITY, MILD 11/17/2006   PEPTIC ULCER DISEASE 07/22/2007   PEPTIC ULCER DISEASE, HX OF 11/17/2006   Post-operative nausea and vomiting    past 2 surgeries   SINUSITIS- ACUTE-NOS 12/08/2007   SKIN LESION 12/08/2007    Family History  Problem Relation Age of Onset   Heart disease Mother    Heart disease Father    COPD Brother    Stroke Other    Cancer Other        prostate   Colon cancer Neg Hx    Colon polyps Neg Hx    Esophageal cancer Neg Hx    Rectal cancer Neg Hx    Stomach cancer Neg Hx     Past Surgical History:  Procedure Laterality Date   inguinal herniorrhapy     bilat   s/p incarcerated recurrent ventral hernia     s/p meckel's diverticulum     Social History   Occupational History   Not on file  Tobacco Use   Smoking status: Never   Smokeless tobacco: Never  Vaping Use   Vaping status: Never Used  Substance and Sexual Activity   Alcohol use: No   Drug use: No   Sexual activity: Not on file

## 2023-07-21 NOTE — Progress Notes (Addendum)
 This encounter was created in error - please disregard.  Pt declined AWV.

## 2023-08-10 ENCOUNTER — Ambulatory Visit: Admitting: Sports Medicine

## 2023-08-24 ENCOUNTER — Telehealth: Payer: Self-pay

## 2023-08-24 NOTE — Telephone Encounter (Addendum)
 This patient is appearing on a report for being at risk of failing the adherence measure for hypertension (ACEi/ARB) medications this calendar year.   Medication: benazepril  40 mg Last fill date: 04/19/23 for 90 day supply  Spoke with patient who stated he has plenty of medication at home due to previously being on auto-refill with [facility] and [facility]. States he is good about taking his medications as prescribed and wants to be the one who will call the pharmacy when he is ready to refill benazepril . Patient notes that he's moving and he's hoping to wait until he gets moved to order refill from mail order to avoid medication getting lost in the mail.  Of note - patient is requesting a form from Dr. Autry Legions that allows him to get a walk-in shower for his new place as the bathtub is too high to get his legs over the tub. Advised patient to call the office to request form.  Abelina Abide, PharmD PGY1 Pharmacy Resident 08/24/2023 11:16 AM

## 2023-09-12 ENCOUNTER — Ambulatory Visit: Admitting: Podiatry

## 2023-09-12 ENCOUNTER — Encounter: Payer: Self-pay | Admitting: Podiatry

## 2023-09-12 VITALS — Ht 67.0 in | Wt 217.0 lb

## 2023-09-12 DIAGNOSIS — M76821 Posterior tibial tendinitis, right leg: Secondary | ICD-10-CM | POA: Diagnosis not present

## 2023-09-12 MED ORDER — TRIAMCINOLONE ACETONIDE 10 MG/ML IJ SUSP: 10.0000 mg | Freq: Once | INTRAMUSCULAR | Status: AC

## 2023-09-14 NOTE — Progress Notes (Signed)
 Subjective:   Patient ID: Joel King, male   DOB: 73 y.o.   MRN: [id number]   HPI Patient states that his right and left foot have been hurting on the side and making it hard to wear shoe gear.  States it has been about 10 months that he did very well for around 8 months   ROS      Objective:  Physical Exam  Neurovascular status intact with inflammation pain of the peroneal tendon and insertion right over left foot      Assessment:  Peroneal tendinitis right over left painful     Plan:  H&P discussed risk of injection he wants it done would just get a do the right 1.  Sterile prep and injected the tendon at insertion 3 mg dexamethasone Kenalog  5 mg Xylocaine  and applied sterile dressing  X-rays do indicate enlargement around the area no indication of fracture or arthritis

## 2023-10-03 ENCOUNTER — Ambulatory Visit (INDEPENDENT_AMBULATORY_CARE_PROVIDER_SITE_OTHER): Admitting: Internal Medicine

## 2023-10-03 ENCOUNTER — Ambulatory Visit: Payer: Self-pay | Admitting: Internal Medicine

## 2023-10-03 ENCOUNTER — Encounter: Payer: Self-pay | Admitting: Internal Medicine

## 2023-10-03 VITALS — BP 128/76 | HR 69 | Temp 97.9°F | Ht 67.0 in | Wt 213.0 lb

## 2023-10-03 DIAGNOSIS — J22 Unspecified acute lower respiratory infection: Secondary | ICD-10-CM | POA: Diagnosis not present

## 2023-10-03 DIAGNOSIS — Z125 Encounter for screening for malignant neoplasm of prostate: Secondary | ICD-10-CM

## 2023-10-03 DIAGNOSIS — E538 Deficiency of other specified B group vitamins: Secondary | ICD-10-CM

## 2023-10-03 DIAGNOSIS — E559 Vitamin D deficiency, unspecified: Secondary | ICD-10-CM | POA: Diagnosis not present

## 2023-10-03 DIAGNOSIS — I10 Essential (primary) hypertension: Secondary | ICD-10-CM

## 2023-10-03 DIAGNOSIS — R7303 Prediabetes: Secondary | ICD-10-CM | POA: Diagnosis not present

## 2023-10-03 DIAGNOSIS — E7849 Other hyperlipidemia: Secondary | ICD-10-CM

## 2023-10-03 LAB — HEMOGLOBIN A1C: Hgb A1c MFr Bld: 5.8 % (ref 4.6–6.5)

## 2023-10-03 LAB — CBC WITH DIFFERENTIAL/PLATELET
Basophils Absolute: 0 K/uL (ref 0.0–0.1)
Basophils Relative: 0.2 % (ref 0.0–3.0)
Eosinophils Absolute: 0.2 K/uL (ref 0.0–0.7)
Eosinophils Relative: 1.6 % (ref 0.0–5.0)
HCT: 44.4 % (ref 39.0–52.0)
Hemoglobin: 15.4 g/dL (ref 13.0–17.0)
Lymphocytes Relative: 5.3 % — ABNORMAL LOW (ref 12.0–46.0)
Lymphs Abs: 0.6 K/uL — ABNORMAL LOW (ref 0.7–4.0)
MCHC: 34.8 g/dL (ref 30.0–36.0)
MCV: 92.6 fl (ref 78.0–100.0)
Monocytes Absolute: 1.1 K/uL — ABNORMAL HIGH (ref 0.1–1.0)
Monocytes Relative: 9.1 % (ref 3.0–12.0)
Neutro Abs: 9.7 K/uL — ABNORMAL HIGH (ref 1.4–7.7)
Neutrophils Relative %: 83.8 % — ABNORMAL HIGH (ref 43.0–77.0)
Platelets: 201 K/uL (ref 150.0–400.0)
RBC: 4.79 Mil/uL (ref 4.22–5.81)
RDW: 13.2 % (ref 11.5–15.5)
WBC: 11.6 K/uL — ABNORMAL HIGH (ref 4.0–10.5)

## 2023-10-03 LAB — PSA: PSA: 1 ng/mL (ref 0.10–4.00)

## 2023-10-03 LAB — HEPATIC FUNCTION PANEL
ALT: 15 U/L (ref 0–53)
AST: 18 U/L (ref 0–37)
Albumin: 4.4 g/dL (ref 3.5–5.2)
Alkaline Phosphatase: 87 U/L (ref 39–117)
Bilirubin, Direct: 0.2 mg/dL (ref 0.0–0.3)
Total Bilirubin: 1.2 mg/dL (ref 0.2–1.2)
Total Protein: 6.9 g/dL (ref 6.0–8.3)

## 2023-10-03 LAB — TSH: TSH: 0.97 u[IU]/mL (ref 0.35–5.50)

## 2023-10-03 LAB — BASIC METABOLIC PANEL WITH GFR
BUN: 15 mg/dL (ref 6–23)
CO2: 28 meq/L (ref 19–32)
Calcium: 9.5 mg/dL (ref 8.4–10.5)
Chloride: 98 meq/L (ref 96–112)
Creatinine, Ser: 0.72 mg/dL (ref 0.40–1.50)
GFR: 90.77 mL/min (ref >60.00)
Glucose, Bld: 116 mg/dL — ABNORMAL HIGH (ref 70–99)
Potassium: 3.2 meq/L — ABNORMAL LOW (ref 3.5–5.1)
Sodium: 137 meq/L (ref 135–145)

## 2023-10-03 LAB — LIPID PANEL
Cholesterol: 134 mg/dL (ref 0–200)
HDL: 35.1 mg/dL — ABNORMAL LOW (ref >39.00)
LDL Cholesterol: 81 mg/dL (ref 0–99)
NonHDL: 98.68
Total CHOL/HDL Ratio: 4
Triglycerides: 89 mg/dL (ref 0.0–149.0)
VLDL: 17.8 mg/dL (ref 0.0–40.0)

## 2023-10-03 LAB — VITAMIN B12: Vitamin B-12: 269 pg/mL (ref 211–911)

## 2023-10-03 LAB — VITAMIN D 25 HYDROXY (VIT D DEFICIENCY, FRACTURES): VITD: 49.57 ng/mL (ref 30.00–100.00)

## 2023-10-03 MED ORDER — HYDROCODONE BIT-HOMATROP MBR 5-1.5 MG/5ML PO SOLN: 5.0000 mL | Freq: Four times a day (QID) | ORAL | 0 refills | Status: AC | PRN

## 2023-10-03 MED ORDER — AZITHROMYCIN 250 MG PO TABS: ORAL_TABLET | ORAL | 1 refills | Status: AC

## 2023-10-03 NOTE — Assessment & Plan Note (Signed)
Last vitamin D Lab Results  Component Value Date   VD25OH 36.35 11/01/2022   Low, to start oral replacement

## 2023-10-03 NOTE — Assessment & Plan Note (Signed)
Mild to mod, for antibx course zpack, and cough med prn,  to f/u any worsening symptoms or concerns

## 2023-10-03 NOTE — Assessment & Plan Note (Signed)
 Lab Results  Component Value Date   LDLCALC 63 10/28/2021   Stable, pt to continue current statin zocor  40 mg at bedtime and f/u lab today

## 2023-10-03 NOTE — Progress Notes (Signed)
 Patient ID: Joel King, male   DOB: November 07, 1950, 73 y.o.   MRN: [id number]        Chief Complaint: follow up URI, low b12, hld, preDM, low vit d       HPI:  Joel King is a 73 y.o. male  Here with 2-3 days acute onset fever, facial pain, pressure, headache, general weakness and malaise, and greenish d/c, with mild ST and cough, but pt denies chest pain, wheezing, increased sob or doe, orthopnea, PND, increased LE swelling, palpitations, dizziness or syncope.   Pt denies polydipsia, polyuria, or new focal neuro s/s.    Pt denies fever, wt loss, night sweats, loss of appetite, or other constitutional symptoms         Wt Readings from Last 3 Encounters:  10/03/23 213 lb (96.6 kg)  09/12/23 217 lb (98.4 kg)  07/13/23 217 lb (98.4 kg)   BP Readings from Last 3 Encounters:  10/03/23 128/76  07/13/23 126/78  07/05/23 124/68         Past Medical History:  Diagnosis Date   ANXIETY 11/21/2006   Cancer (HCC)    skin cancer left cheek   DEPRESSION 05/31/2008   DIABETES MELLITUS, TYPE II 07/22/2007   no meds   Diarrhea 05/31/2008   DVT (deep venous thrombosis) (HCC) 02/08/2014   HYPERLIPIDEMIA 11/21/2006   HYPERTENSION 11/17/2006   INSOMNIA-SLEEP DISORDER-UNSPEC 07/21/2007   OBESITY, MILD 11/17/2006   PEPTIC ULCER DISEASE 07/22/2007   PEPTIC ULCER DISEASE, HX OF 11/17/2006   Post-operative nausea and vomiting    past 2 surgeries   SINUSITIS- ACUTE-NOS 12/08/2007   SKIN LESION 12/08/2007   Past Surgical History:  Procedure Laterality Date   inguinal herniorrhapy     bilat   s/p incarcerated recurrent ventral hernia     s/p meckel's diverticulum      reports that he has never smoked. He has never used smokeless tobacco. He reports that he does not drink alcohol and does not use drugs. family history includes COPD in his brother; Cancer in an other family member; Heart disease in his father and mother; Stroke in an other family member. Allergies  Allergen Reactions   Atorvastatin      Unsure of results   Pravastatin Sodium     Unsure of reaction   Rosuvastatin      Unsure of reaction   Current Outpatient Medications on File Prior to Visit  Medication Sig Dispense Refill   acetaminophen (TYLENOL) 500 MG tablet Take 500 mg by mouth every 6 (six) hours as needed.     amLODipine  (NORVASC ) 10 MG tablet Take 1 tablet (10 mg total) by mouth daily. 90 tablet 3   aspirin 81 MG tablet Take 81 mg by mouth daily.     atenolol -chlorthalidone  (TENORETIC ) 100-25 MG tablet Take 1 tablet by mouth daily. 90 tablet 3   benazepril  (LOTENSIN ) 40 MG tablet Take 1 tablet (40 mg total) by mouth daily. 90 tablet 3   calcium -vitamin D  (OSCAL WITH D) 500-5 MG-MCG tablet Take 1 tablet by mouth.     celecoxib  (CELEBREX ) 200 MG capsule Take 1 capsule (200 mg total) by mouth 2 (two) times daily as needed. 60 capsule 5   famotidine  (PEPCID ) 20 MG tablet Take 1 tablet (20 mg total) by mouth 2 (two) times daily. 180 tablet 3   finasteride  (PROSCAR ) 5 MG tablet Take 1 tablet (5 mg total) by mouth daily. 90 tablet 3   gabapentin  (NEURONTIN ) 300 MG capsule Take 1 capsule (  300 mg total) by mouth 3 (three) times daily. 90 capsule 11   Multiple Vitamins-Minerals (CENTRUM SILVER PO) Take by mouth daily at 6 (six) AM.     Multiple Vitamins-Minerals (OCUVITE PRESERVISION PO) Take by mouth.     Na Sulfate-K Sulfate-Mg Sulf 17.5-3.13-1.6 GM/177ML SOLN See admin instructions.     potassium chloride  (MICRO-K ) 10 MEQ CR capsule Take 2 capsules (20 mEq total) by mouth daily. 180 capsule 3   simvastatin  (ZOCOR ) 40 MG tablet TAKE 1 TABLET AT BEDTIME. 90 tablet 3   No current facility-administered medications on file prior to visit.        ROS:  All others reviewed and negative.  Objective        PE:  BP 128/76 (BP Location: Left Arm, Patient Position: Sitting)   Pulse 69   Temp 97.9 F (36.6 C) (Temporal)   Ht 5' 7 (1.702 m)   Wt 213 lb (96.6 kg)   SpO2 98%   BMI 33.36 kg/m                  Constitutional: Pt appears in NAD               HENT: Head: NCAT.                Right Ear: External ear normal.                 Left Ear: External ear normal. Bilat tm's with mild erythema.  Max sinus areas mild tender.  Pharynx with mild erythema, no exudate               Eyes: . Pupils are equal, round, and reactive to light. Conjunctivae and EOM are normal               Nose: without d/c or deformity               Neck: Neck supple. Gross normal ROM               Cardiovascular: Normal rate and regular rhythm.                 Pulmonary/Chest: Effort normal and breath sounds without rales or wheezing.                          Neurological: Pt is alert. At baseline orientation, motor grossly intact               Skin: Skin is warm. No rashes, no other new lesions, LE edema - none               Psychiatric: Pt behavior is normal without agitation   Micro: none  Cardiac tracings I have personally interpreted today:  none  Pertinent Radiological findings (summarize): none   Lab Results  Component Value Date   WBC 11.0 (H) 11/01/2022   HGB 15.5 11/01/2022   HCT 45.4 11/01/2022   PLT 223.0 11/01/2022   GLUCOSE 146 (H) 11/01/2022   CHOL 122 11/01/2022   TRIG 214.0 (H) 11/01/2022   HDL 34.60 (L) 11/01/2022   LDLDIRECT 65.0 11/01/2022   LDLCALC 63 10/28/2021   ALT 13 11/01/2022   AST 16 11/01/2022   NA 141 11/01/2022   K 3.8 11/01/2022   CL 102 11/01/2022   CREATININE 1.01 11/01/2022   BUN 16 11/01/2022   CO2 29 11/01/2022   TSH 1.66 11/01/2022   PSA 1.24 11/01/2022  HGBA1C 5.6 11/01/2022   Assessment/Plan:  Joel King is a 73 y.o. White or Caucasian [1] male with  has a past medical history of ANXIETY (11/21/2006), Cancer [facility]), DEPRESSION (05/31/2008), DIABETES MELLITUS, TYPE II (07/22/2007), Diarrhea (05/31/2008), DVT (deep venous thrombosis) (HCC) (02/08/2014), HYPERLIPIDEMIA (11/21/2006), HYPERTENSION (11/17/2006), INSOMNIA-SLEEP DISORDER-UNSPEC (07/21/2007), OBESITY, MILD  (11/17/2006), PEPTIC ULCER DISEASE (07/22/2007), PEPTIC ULCER DISEASE, HX OF (11/17/2006), Post-operative nausea and vomiting, SINUSITIS- ACUTE-NOS (12/08/2007), and SKIN LESION (12/08/2007).  Acute respiratory infection Mild to mod, for antibx course zpack, and cough med prn,  to f/u any worsening symptoms or concerns  B12 deficiency Lab Results  Component Value Date   VITAMINB12 280 11/01/2022   Low, to start oral replacement - b12 1000 mcg qd   Essential hypertension BP Readings from Last 3 Encounters:  10/03/23 128/76  07/13/23 126/78  07/05/23 124/68   Stable, pt to continue medical treatment norvasc  10 every day, tenoretic  100 25 every day, lostensin 40 qd   Hyperlipidemia Lab Results  Component Value Date   LDLCALC 63 10/28/2021   Stable, pt to continue current statin zocor  40 mg at bedtime and f/u lab today   Pre-diabetes Lab Results  Component Value Date   HGBA1C 5.6 11/01/2022   Stable, pt to continue current medical treatment  - diet, wt control   Vitamin D  deficiency Last vitamin D  Lab Results  Component Value Date   VD25OH 36.35 11/01/2022   Low, to start oral replacement  Followup: Return if symptoms worsen or fail to improve.  Lynwood Rush, MD 10/03/2023 9:33 AM [facility]

## 2023-10-03 NOTE — Patient Instructions (Signed)
 Please take all new medication as prescribed - the antibiotic, and cough medicine as needed  Please continue all other medications as before, and refills have been done if requested.  Please have the pharmacy call with any other refills you may need.  Please continue your efforts at being more active, low cholesterol diet, and weight control.  You are otherwise up to date with prevention measures today.  Please keep your appointments with your specialists as you may have planned  Please go to the LAB at the blood drawing area for the tests to be done  You will be contacted by phone if any changes need to be made immediately.  Otherwise, you will receive a letter about your results with an explanation, but please check with MyChart first.  Please make an Appointment to return in Aug 2025, or sooner if needed, also with Lab Appointment for testing done 3-5 days before at the FIRST FLOOR Lab (so this is for TWO appointments - please see the scheduling desk as you leave)

## 2023-10-03 NOTE — Assessment & Plan Note (Signed)
Lab Results  Component Value Date   HGBA1C 5.6 11/01/2022   Stable, pt to continue current medical treatment  - diet, wt control

## 2023-10-03 NOTE — Assessment & Plan Note (Signed)
 BP Readings from Last 3 Encounters:  10/03/23 128/76  07/13/23 126/78  07/05/23 124/68   Stable, pt to continue medical treatment norvasc  10 every day, tenoretic  100 25 every day, lostensin 40 qd

## 2023-10-03 NOTE — Assessment & Plan Note (Signed)
Lab Results  Component Value Date   VITAMINB12 280 11/01/2022   Low, to start oral replacement - b12 1000 mcg qd

## 2023-10-03 NOTE — Progress Notes (Signed)
 The test results show that your current treatment is OK, as the tests are stable.  Please continue the same plan.  There is no other need for change of treatment or further evaluation based on these results, at this time.  thanks

## 2023-10-04 LAB — URINALYSIS, ROUTINE W REFLEX MICROSCOPIC
Hgb urine dipstick: NEGATIVE
Leukocytes,Ua: NEGATIVE
Nitrite: NEGATIVE
RBC / HPF: NONE SEEN (ref 0–?)
Specific Gravity, Urine: 1.01 (ref 1.000–1.030)
Total Protein, Urine: 100 — AB
Urine Glucose: NEGATIVE
Urobilinogen, UA: 2 — AB (ref 0.0–1.0)
pH: 7 (ref 5.0–8.0)

## 2023-11-09 ENCOUNTER — Encounter: Payer: Self-pay | Admitting: Internal Medicine

## 2023-11-09 ENCOUNTER — Ambulatory Visit: Payer: Medicare PPO | Admitting: Internal Medicine

## 2023-11-09 VITALS — BP 128/62 | HR 68 | Temp 97.9°F | Ht 67.0 in | Wt 226.2 lb

## 2023-11-09 DIAGNOSIS — Z0000 Encounter for general adult medical examination without abnormal findings: Secondary | ICD-10-CM

## 2023-11-09 DIAGNOSIS — R7303 Prediabetes: Secondary | ICD-10-CM

## 2023-11-09 DIAGNOSIS — E7849 Other hyperlipidemia: Secondary | ICD-10-CM

## 2023-11-09 DIAGNOSIS — I10 Essential (primary) hypertension: Secondary | ICD-10-CM | POA: Diagnosis not present

## 2023-11-09 DIAGNOSIS — E538 Deficiency of other specified B group vitamins: Secondary | ICD-10-CM | POA: Diagnosis not present

## 2023-11-09 DIAGNOSIS — E559 Vitamin D deficiency, unspecified: Secondary | ICD-10-CM | POA: Diagnosis not present

## 2023-11-09 DIAGNOSIS — Z0001 Encounter for general adult medical examination with abnormal findings: Secondary | ICD-10-CM

## 2023-11-09 NOTE — Assessment & Plan Note (Signed)
 Last vitamin D  Lab Results  Component Value Date   VD25OH 49.57 10/03/2023   Stable, cont oral replacement

## 2023-11-09 NOTE — Assessment & Plan Note (Signed)
 BP Readings from Last 3 Encounters:  11/09/23 128/62  10/03/23 128/76  07/13/23 126/78   Stable, pt to continue medical treatment norvasc  10 every day, tenoretic  100 25 every day, lotensin  40 qd

## 2023-11-09 NOTE — Assessment & Plan Note (Signed)
 Lab Results  Component Value Date   VITAMINB12 269 10/03/2023   Low, to start oral replacement - b12 1000 mcg qd

## 2023-11-09 NOTE — Progress Notes (Signed)
 Patient ID: Joel King, male   DOB: 1950-11-11, 73 y.o.   MRN: [id number]         Chief Complaint:: wellness exam and low vit d, preDM, htn, hld, low b12       HPI:  Joel King is a 73 y.o. male here for wellness exam, o/w up to date                Also Pt denies chest pain, increased sob or doe, wheezing, orthopnea, PND, increased LE swelling, palpitations, dizziness or syncope.   Pt denies polydipsia, polyuria, or new focal neuro s/s.    Pt denies fever, wt loss, night sweats, loss of appetite, or other constitutional symptoms  Did have recent cortisone per ortho Dr Harden to both knees now improved.  No falls.     Wt Readings from Last 3 Encounters:  11/09/23 226 lb 3.2 oz (102.6 kg)  10/03/23 213 lb (96.6 kg)  09/12/23 217 lb (98.4 kg)   BP Readings from Last 3 Encounters:  11/09/23 128/62  10/03/23 128/76  07/13/23 126/78   Immunization History  Administered Date(s) Administered   DTP 09/05/1997   Fluad Quad(high Dose 65+) 11/11/2018   Influenza Split 12/20/2012   Influenza, High Dose Seasonal PF 12/11/2020   Influenza,inj,Quad PF,6+ Mos 11/29/2013   Influenza-Unspecified 11/20/2015, 11/28/2016, 11/26/2018   PFIZER(Purple Top)SARS-COV-2 Vaccination 04/22/2019, 05/14/2019, 12/21/2019, 07/18/2020, 12/06/2020   Pneumococcal Conjugate-13 09/27/2015   Pneumococcal Polysaccharide-23 03/30/1995, 07/29/2008, 12/09/2015, 11/28/2016   Td 07/29/2008   Tdap 11/21/2014   Zoster Recombinant(Shingrix) 06/28/2022, 09/06/2022   Zoster, Live 11/21/2014   Health Maintenance Due  Topic Date Due   Diabetic kidney evaluation - Urine ACR  Never done   Medicare Annual Wellness (AWV)  07/12/2023   INFLUENZA VACCINE  10/28/2023      Past Medical History:  Diagnosis Date   ANXIETY 11/21/2006   Cancer (HCC)    skin cancer left cheek   DEPRESSION 05/31/2008   DIABETES MELLITUS, TYPE II 07/22/2007   no meds   Diarrhea 05/31/2008   DVT (deep venous thrombosis) (HCC) 02/08/2014    HYPERLIPIDEMIA 11/21/2006   HYPERTENSION 11/17/2006   INSOMNIA-SLEEP DISORDER-UNSPEC 07/21/2007   OBESITY, MILD 11/17/2006   PEPTIC ULCER DISEASE 07/22/2007   PEPTIC ULCER DISEASE, HX OF 11/17/2006   Post-operative nausea and vomiting    past 2 surgeries   SINUSITIS- ACUTE-NOS 12/08/2007   SKIN LESION 12/08/2007   Past Surgical History:  Procedure Laterality Date   inguinal herniorrhapy     bilat   s/p incarcerated recurrent ventral hernia     s/p meckel's diverticulum      reports that he has never smoked. He has never used smokeless tobacco. He reports that he does not drink alcohol and does not use drugs. family history includes COPD in his brother; Cancer in an other family member; Heart disease in his father and mother; Stroke in an other family member. Allergies  Allergen Reactions   Atorvastatin     Unsure of results   Pravastatin Sodium     Unsure of reaction   Rosuvastatin      Unsure of reaction   Current Outpatient Medications on File Prior to Visit  Medication Sig Dispense Refill   acetaminophen (TYLENOL) 500 MG tablet Take 500 mg by mouth every 6 (six) hours as needed.     amLODipine  (NORVASC ) 10 MG tablet Take 1 tablet (10 mg total) by mouth daily. 90 tablet 3   aspirin 81 MG tablet Take 81  mg by mouth daily.     atenolol -chlorthalidone  (TENORETIC ) 100-25 MG tablet Take 1 tablet by mouth daily. 90 tablet 3   benazepril  (LOTENSIN ) 40 MG tablet Take 1 tablet (40 mg total) by mouth daily. 90 tablet 3   calcium -vitamin D  (OSCAL WITH D) 500-5 MG-MCG tablet Take 1 tablet by mouth.     celecoxib  (CELEBREX ) 200 MG capsule Take 1 capsule (200 mg total) by mouth 2 (two) times daily as needed. 60 capsule 5   famotidine  (PEPCID ) 20 MG tablet Take 1 tablet (20 mg total) by mouth 2 (two) times daily. 180 tablet 3   finasteride  (PROSCAR ) 5 MG tablet Take 1 tablet (5 mg total) by mouth daily. 90 tablet 3   gabapentin  (NEURONTIN ) 300 MG capsule Take 1 capsule (300 mg total) by mouth 3  (three) times daily. 90 capsule 11   Multiple Vitamins-Minerals (CENTRUM SILVER PO) Take by mouth daily at 6 (six) AM.     Multiple Vitamins-Minerals (OCUVITE PRESERVISION PO) Take by mouth.     Na Sulfate-K Sulfate-Mg Sulf 17.5-3.13-1.6 GM/177ML SOLN See admin instructions.     potassium chloride  (MICRO-K ) 10 MEQ CR capsule Take 2 capsules (20 mEq total) by mouth daily. 180 capsule 3   simvastatin  (ZOCOR ) 40 MG tablet TAKE 1 TABLET AT BEDTIME. 90 tablet 3   No current facility-administered medications on file prior to visit.        ROS:  All others reviewed and negative.  Objective        PE:  BP 128/62   Pulse 68   Temp 97.9 F (36.6 C)   Ht 5' 7 (1.702 m)   Wt 226 lb 3.2 oz (102.6 kg)   SpO2 97%   BMI 35.43 kg/m                 Constitutional: Pt appears in NAD               HENT: Head: NCAT.                Right Ear: External ear normal.                 Left Ear: External ear normal.                Eyes: . Pupils are equal, round, and reactive to light. Conjunctivae and EOM are normal               Nose: without d/c or deformity               Neck: Neck supple. Gross normal ROM               Cardiovascular: Normal rate and regular rhythm.                 Pulmonary/Chest: Effort normal and breath sounds without rales or wheezing.                Abd:  Soft, NT, ND, + BS, no organomegaly               Neurological: Pt is alert. At baseline orientation, motor grossly intact               Skin: Skin is warm. No rashes, no other new lesions, LE edema - none               Psychiatric: Pt behavior is normal without agitation   Micro: none  Cardiac tracings I have personally  interpreted today:  none  Pertinent Radiological findings (summarize): none   Lab Results  Component Value Date   WBC 11.6 (H) 10/03/2023   HGB 15.4 10/03/2023   HCT 44.4 10/03/2023   PLT 201.0 10/03/2023   GLUCOSE 116 (H) 10/03/2023   CHOL 134 10/03/2023   TRIG 89.0 10/03/2023   HDL 35.10 (L)  10/03/2023   LDLDIRECT 65.0 11/01/2022   LDLCALC 81 10/03/2023   ALT 15 10/03/2023   AST 18 10/03/2023   NA 137 10/03/2023   K 3.2 (L) 10/03/2023   CL 98 10/03/2023   CREATININE 0.72 10/03/2023   BUN 15 10/03/2023   CO2 28 10/03/2023   TSH 0.97 10/03/2023   PSA 1.00 10/03/2023   HGBA1C 5.8 10/03/2023   Assessment/Plan:  Joel King is a 73 y.o. White or Caucasian [1] male with  has a past medical history of ANXIETY (11/21/2006), Cancer [facility]), DEPRESSION (05/31/2008), DIABETES MELLITUS, TYPE II (07/22/2007), Diarrhea (05/31/2008), DVT (deep venous thrombosis) (HCC) (02/08/2014), HYPERLIPIDEMIA (11/21/2006), HYPERTENSION (11/17/2006), INSOMNIA-SLEEP DISORDER-UNSPEC (07/21/2007), OBESITY, MILD (11/17/2006), PEPTIC ULCER DISEASE (07/22/2007), PEPTIC ULCER DISEASE, HX OF (11/17/2006), Post-operative nausea and vomiting, SINUSITIS- ACUTE-NOS (12/08/2007), and SKIN LESION (12/08/2007).  Encounter for well adult exam with abnormal findings Age and sex appropriate education and counseling updated with regular exercise and diet Referrals for preventative services - none needed Immunizations addressed - none needed Smoking counseling  - none needed Evidence for depression or other mood disorder - none significant Most recent labs reviewed. I have personally reviewed and have noted: 1) the patient's medical and social history 2) The patient's current medications and supplements 3) The patient's height, weight, and BMI have been recorded in the chart   Vitamin D  deficiency Last vitamin D  Lab Results  Component Value Date   VD25OH 49.57 10/03/2023   Stable, cont oral replacement   Pre-diabetes Lab Results  Component Value Date   HGBA1C 5.8 10/03/2023   Stable, pt to continue current medical treatment  - diet, wt control   Hyperlipidemia Lab Results  Component Value Date   LDLCALC 81 10/03/2023   Stable, pt to continue current statin zocor  40 mg qd   Essential hypertension BP  Readings from Last 3 Encounters:  11/09/23 128/62  10/03/23 128/76  07/13/23 126/78   Stable, pt to continue medical treatment norvasc  10 every day, tenoretic  100 25 every day, lotensin  40 qd   B12 deficiency Lab Results  Component Value Date   VITAMINB12 269 10/03/2023   Low, to start oral replacement - b12 1000 mcg qd  Followup: Return in about 6 months (around 05/11/2024).  Lynwood Rush, MD 11/09/2023 7:53 PM [facility] [place] [facility]

## 2023-11-09 NOTE — Assessment & Plan Note (Signed)

## 2023-11-09 NOTE — Assessment & Plan Note (Signed)
 Lab Results  Component Value Date   HGBA1C 5.8 10/03/2023   Stable, pt to continue current medical treatment  - diet, wt control

## 2023-11-09 NOTE — Patient Instructions (Signed)
 Please continue all other medications as before, and refills have been done if requested.  Please have the pharmacy call with any other refills you may need.  Please continue your efforts at being more active, low cholesterol diet, and weight control.  You are otherwise up to date with prevention measures today.  Please keep your appointments with your specialists as you may have planned - Dr Harden for the knees  Your lab work was good !  Please make an Appointment to return in 6 months, or sooner if needed, also with Lab Appointment for testing done 3-5 days before at the FIRST FLOOR Lab (so this is for TWO appointments - please see the scheduling desk as you leave)

## 2023-11-09 NOTE — Assessment & Plan Note (Signed)
 Lab Results  Component Value Date   LDLCALC 81 10/03/2023   Stable, pt to continue current statin zocor  40 mg qd

## 2023-11-17 ENCOUNTER — Ambulatory Visit: Admitting: Orthopedic Surgery

## 2023-11-17 DIAGNOSIS — M170 Bilateral primary osteoarthritis of knee: Secondary | ICD-10-CM | POA: Diagnosis not present

## 2023-11-17 DIAGNOSIS — I872 Venous insufficiency (chronic) (peripheral): Secondary | ICD-10-CM

## 2023-11-18 ENCOUNTER — Encounter: Payer: Self-pay | Admitting: Orthopedic Surgery

## 2023-11-18 DIAGNOSIS — M170 Bilateral primary osteoarthritis of knee: Secondary | ICD-10-CM | POA: Diagnosis not present

## 2023-11-18 MED ORDER — LIDOCAINE HCL 1 % IJ SOLN
5.0000 mL | INTRAMUSCULAR | Status: AC | PRN
  Administered 2023-11-18: 5 mL

## 2023-11-18 MED ORDER — METHYLPREDNISOLONE ACETATE 40 MG/ML IJ SUSP
10.0000 mg | INTRAMUSCULAR | Status: AC | PRN
  Administered 2023-11-18: 10 mg via INTRA_ARTICULAR

## 2023-11-18 NOTE — Progress Notes (Signed)
 Office Visit Note   Patient: Joel King           Date of Birth: 24-Dec-1950           MRN: [id number] Visit Date: 11/17/2023              Requested by: Norleen Lynwood ORN, MD [address] [place],  [place] [address] PCP: Norleen Lynwood ORN, MD  Chief Complaint  Patient presents with   Right Knee - Follow-up   Left Knee - Follow-up      HPI: Patient is a 73 year old gentleman who presents with osteoarthritis both knees.  He states he has had good relief with previous injections.  Assessment & Plan: Visit Diagnoses:  1. Bilateral primary osteoarthritis of knee     Plan: Both knees were injected he tolerated this well.  Follow-Up Instructions: Return if symptoms worsen or fail to improve.   Ortho Exam  Patient is alert, oriented, no adenopathy, well-dressed, normal affect, normal respiratory effort. Examination patient has crepitation and pain with range of motion of both knees.  Collaterals and cruciates are stable.  He does have venous stasis changes in both legs with pitting edema but no ulcers.    Imaging: No results found. No images are attached to the encounter.  Labs: Lab Results  Component Value Date   HGBA1C 5.8 10/03/2023   HGBA1C 5.6 11/01/2022   HGBA1C 5.8 10/28/2021     Lab Results  Component Value Date   ALBUMIN 4.4 10/03/2023   ALBUMIN 4.6 11/01/2022   ALBUMIN 4.6 10/28/2021    Lab Results  Component Value Date   MG 1.9 10/27/2020   MG 1.8 04/22/2020   Lab Results  Component Value Date   VD25OH 49.57 10/03/2023   VD25OH 36.35 11/01/2022   VD25OH 33.31 10/28/2021    No results found for: PREALBUMIN    Latest Ref Rng & Units 10/03/2023    9:16 AM 11/01/2022   12:54 PM 10/28/2021   11:22 AM  CBC EXTENDED  WBC 4.0 - 10.5 K/uL 11.6  11.0  11.2   RBC 4.22 - 5.81 Mil/uL 4.79  4.81  4.79   Hemoglobin 13.0 - 17.0 g/dL 84.5  84.4  84.3   HCT 39.0 - 52.0 % 44.4  45.4  44.8   Platelets 150.0 - 400.0 K/uL 201.0  223.0  201.0   NEUT# 1.4 -  7.7 K/uL 9.7  8.9  9.0   Lymph# 0.7 - 4.0 K/uL 0.6  0.9  1.0      There is no height or weight on file to calculate BMI.  Orders:  No orders of the defined types were placed in this encounter.  No orders of the defined types were placed in this encounter.    Procedures: Large Joint Inj: bilateral knee on 11/18/2023 3:09 PM Indications: pain and diagnostic evaluation Details: 22 G 1.5 in needle, anteromedial approach  Arthrogram: No  Medications (Right): 5 mL lidocaine  1 %; 10 mg methylPREDNISolone  acetate 40 MG/ML Medications (Left): 5 mL lidocaine  1 %; 10 mg methylPREDNISolone  acetate 40 MG/ML Outcome: tolerated well, no immediate complications Procedure, treatment alternatives, risks and benefits explained, specific risks discussed. Consent was given by the patient. Immediately prior to procedure a time out was called to verify the correct patient, procedure, equipment, support staff and site/side marked as required. Patient was prepped and draped in the usual sterile fashion.      Clinical Data: No additional findings.  ROS:  All other systems negative,  except as noted in the HPI. Review of Systems  Objective: Vital Signs: There were no vitals taken for this visit.  Specialty Comments:  No specialty comments available.  PMFS History: Patient Active Problem List   Diagnosis Date Noted   Acute respiratory infection 10/03/2023   Right shoulder pain 07/05/2023   Low back pain 01/10/2023   B12 deficiency 11/08/2022   Microhematuria 11/08/2022   Vitamin D  deficiency 10/23/2019   Vertigo 02/06/2019   Cough 03/15/2018   BPH (benign prostatic hyperplasia) 08/13/2017   Effusion, right knee 12/13/2016   Bilateral primary osteoarthritis of knee 08/24/2016   Plantar fasciitis 03/16/2016   Achilles tendinitis, right leg 03/16/2016   Rash 01/15/2016   Stasis dermatitis 01/15/2016   Chronic venous insufficiency 12/30/2015   Cellulitis 12/30/2015   Itching 12/09/2015    Dizziness 03/25/2015   Skin lesion of face 03/25/2015   Viral upper respiratory tract infection 01/14/2015   Neuritis of right lower extremity 08/20/2014   DVT (deep venous thrombosis) (HCC) 02/08/2014   Right knee pain 12/22/2013   Fatigue 11/02/2010   Encounter for well adult exam with abnormal findings 11/01/2010   Diarrhea 05/31/2008   Pre-diabetes 07/22/2007   Peptic ulcer 07/22/2007   INSOMNIA-SLEEP DISORDER-UNSPEC 07/21/2007   Hyperlipidemia 11/21/2006   Anxiety state 11/21/2006   Overweight 11/17/2006   Essential hypertension 11/17/2006   Past Medical History:  Diagnosis Date   ANXIETY 11/21/2006   Cancer (HCC)    skin cancer left cheek   DEPRESSION 05/31/2008   DIABETES MELLITUS, TYPE II 07/22/2007   no meds   Diarrhea 05/31/2008   DVT (deep venous thrombosis) (HCC) 02/08/2014   HYPERLIPIDEMIA 11/21/2006   HYPERTENSION 11/17/2006   INSOMNIA-SLEEP DISORDER-UNSPEC 07/21/2007   OBESITY, MILD 11/17/2006   PEPTIC ULCER DISEASE 07/22/2007   PEPTIC ULCER DISEASE, HX OF 11/17/2006   Post-operative nausea and vomiting    past 2 surgeries   SINUSITIS- ACUTE-NOS 12/08/2007   SKIN LESION 12/08/2007    Family History  Problem Relation Age of Onset   Heart disease Mother    Heart disease Father    COPD Brother    Stroke Other    Cancer Other        prostate   Colon cancer Neg Hx    Colon polyps Neg Hx    Esophageal cancer Neg Hx    Rectal cancer Neg Hx    Stomach cancer Neg Hx     Past Surgical History:  Procedure Laterality Date   inguinal herniorrhapy     bilat   s/p incarcerated recurrent ventral hernia     s/p meckel's diverticulum     Social History   Occupational History   Not on file  Tobacco Use   Smoking status: Never   Smokeless tobacco: Never  Vaping Use   Vaping status: Never Used  Substance and Sexual Activity   Alcohol use: No   Drug use: No   Sexual activity: Not on file

## 2023-11-24 DIAGNOSIS — B078 Other viral warts: Secondary | ICD-10-CM | POA: Diagnosis not present

## 2023-12-05 ENCOUNTER — Ambulatory Visit

## 2023-12-06 ENCOUNTER — Ambulatory Visit

## 2024-01-03 ENCOUNTER — Telehealth: Payer: Self-pay

## 2024-01-03 NOTE — Telephone Encounter (Signed)
 Patient dropped off CFO's for refurbish patient is pd in full  Can pu when in  Joel King

## 2024-01-12 ENCOUNTER — Telehealth: Payer: Self-pay

## 2024-01-12 NOTE — Telephone Encounter (Signed)
 Patient called and LVM but didn't leave a reason for call. I called back and LVM to call back  If this is in reference to his orthotics, they are not here yet and I will call him when they're in

## 2024-01-23 ENCOUNTER — Telehealth: Payer: Self-pay

## 2024-01-23 NOTE — Telephone Encounter (Signed)
 LVM- refurb orthotics are here. Pt has paid in full- no appt needed

## 2024-01-25 ENCOUNTER — Other Ambulatory Visit: Payer: Self-pay | Admitting: Internal Medicine

## 2024-01-25 MED ORDER — CELECOXIB 200 MG PO CAPS: 200.0000 mg | ORAL_CAPSULE | Freq: Two times a day (BID) | ORAL | 5 refills | Status: AC | PRN

## 2024-01-25 NOTE — Telephone Encounter (Signed)
 Copied from CRM [id number]. Topic: Clinical - Medication Refill >> Jan 25, 2024  9:09 AM Aleatha C wrote: Medication: celecoxib  (CELEBREX ) 200 MG capsule  Has the patient contacted their pharmacy? Yes (Agent: If no, request that the patient contact the pharmacy for the refill. If patient does not wish to contact the pharmacy document the reason why and proceed with request.) (Agent: If yes, when and what did the pharmacy advise?)  This is the patient's preferred pharmacy:  [facility] [place], [place] - [facility] AT Portal to Registered [facility] [address] [place] [address] Phone: [phone] Fax: [phone]    Is this the correct pharmacy for this prescription? Yes If no, delete pharmacy and type the correct one.   Has the prescription been filled recently? No  Is the patient out of the medication? Yes  Has the patient been seen for an appointment in the last year OR does the patient have an upcoming appointment? Yes  Can we respond through MyChart? No  Agent: Please be advised that Rx refills may take up to 3 business days. We ask that you follow-up with your pharmacy.

## 2024-01-30 ENCOUNTER — Encounter: Payer: Self-pay | Admitting: Radiology

## 2024-02-02 ENCOUNTER — Encounter: Payer: Self-pay | Admitting: Pharmacist

## 2024-02-02 NOTE — Progress Notes (Signed)
 Pharmacy Quality Measure Review  This patient is appearing on a report for being at risk of failing the adherence measure for cholesterol (statin) medications this calendar year.   Medication: Simvastatin  Last fill date: 12/20/23 for 90 day supply  Per fill history review, pt is currently up to date on refill of simvastatin , however previous fills this year have been late. Current PDC is 79%. Will need to get next refill on time to pass adherence measure for 2025.   Darrelyn Drum, PharmD, BCPS, CPP Clinical Pharmacist Practitioner [facility] [phone]

## 2024-02-27 NOTE — Progress Notes (Unsigned)
 Joel King D.Joel King [facility] [address] [place] [address] Phone: [phone]   Assessment and Plan:     1.  Chronic midline low back pain without sciatica (Primary) 2. Closed compression fracture of body of L1 vertebra (HCC) 3. Fall, initial encounter -Chronic with exacerbation, initial visit - Recurrence of low back pain worsened from fall 1 month ago with new L1 compression fracture compared with lumbar x-ray from 2023 - Due to new compression fracture, recent fall, continued pain that is not well-controlled with conservative therapy, recommend further evaluation with lumbar MRI - Start meloxicam 15 mg daily x2 weeks.  If still having pain after 2 weeks, complete 3rd-week of NSAID. May use remaining NSAID as needed once daily for pain control.  Do not to use additional over-the-counter NSAIDs (ibuprofen, naproxen , Advil, Aleve , etc.) while taking prescription NSAIDs.  May use Tylenol [phone] mg 2 to 3 times a day for breakthrough pain. - X-ray obtained in clinic.  My interpretation: L1 anterior vertebral compression fracture new when compared with x-ray from 2023.  Degenerative changes in lumbar spine, most significant at L4-L5 and L5-S1    Pertinent previous records reviewed include lumbar x-ray 2023   Follow Up: 1 week after MRI to review results and discuss treatment plan.  Could further discuss pain management versus neurosurgery referral.  Patient has seen Dr. Georgina in the past for neurosurgery   Subjective:   I, Joel King am a scribe for Dr. Leonce.   Chief Complaint: right shoulder pain   HPI:   07/13/2023 Patient is a 73 year old male with shoulder pain. Patient states was seen by PCP 07/05/2023 Joel King is a 73 y.o. male here with c/o fall off a step stool only 1 foot but landed on lateral right upper arm and shoulder x 3 mo ago, had bruising now resolved, but still stiff and sore.  Does have FROM and has not missed  any work.  Pt denies chest pain, increased sob or doe, wheezing, orthopnea, PND, increased LE swelling, palpitations, dizziness or syncope.   Pt denies polydipsia, polyuria, or new focal neuro s/s.    Pt denies fever, wt loss, night sweats, loss of appetite, or other constitutional symptoms    Pain patches and cream help with his pain. Has some pain with ROM .  Would like to discuss CSI. He can lift things up to 20 pounds   02/28/24 Today patient states that the ortho surgeon wants to put a rod in his back. Taking Advil. He was turning around in the kitchen and fell. He did a twisting motion on the way down. His fall was about about a month on a Wednesday.  Relevant Historical Information: Hypertension, history of DVT, bilateral pitting edema, prediabetes   Additional pertinent review of systems negative.   Current Outpatient Medications:    meloxicam (MOBIC) 15 MG tablet, Take 1 tablet (15 mg total) by mouth daily., Disp: 30 tablet, Rfl: 0   acetaminophen (TYLENOL) 500 MG tablet, Take 500 mg by mouth every 6 (six) hours as needed., Disp: , Rfl:    amLODipine  (NORVASC ) 10 MG tablet, Take 1 tablet (10 mg total) by mouth daily., Disp: 90 tablet, Rfl: 3   aspirin 81 MG tablet, Take 81 mg by mouth daily., Disp: , Rfl:    atenolol -chlorthalidone  (TENORETIC ) 100-25 MG tablet, Take 1 tablet by mouth daily., Disp: 90 tablet, Rfl: 3   benazepril  (LOTENSIN ) 40 MG tablet, Take 1 tablet (40  mg total) by mouth daily., Disp: 90 tablet, Rfl: 3   calcium -vitamin D  (OSCAL WITH D) 500-5 MG-MCG tablet, Take 1 tablet by mouth., Disp: , Rfl:    celecoxib  (CELEBREX ) 200 MG capsule, Take 1 capsule (200 mg total) by mouth 2 (two) times daily as needed., Disp: 60 capsule, Rfl: 5   famotidine  (PEPCID ) 20 MG tablet, Take 1 tablet (20 mg total) by mouth 2 (two) times daily., Disp: 180 tablet, Rfl: 3   finasteride  (PROSCAR ) 5 MG tablet, Take 1 tablet (5 mg total) by mouth daily., Disp: 90 tablet, Rfl: 3   gabapentin   (NEURONTIN ) 300 MG capsule, Take 1 capsule (300 mg total) by mouth 3 (three) times daily., Disp: 90 capsule, Rfl: 11   Multiple Vitamins-Minerals (CENTRUM SILVER PO), Take by mouth daily at 6 (six) AM., Disp: , Rfl:    Multiple Vitamins-Minerals (OCUVITE PRESERVISION PO), Take by mouth., Disp: , Rfl:    Na Sulfate-K Sulfate-Mg Sulf 17.5-3.13-1.6 GM/177ML SOLN, See admin instructions., Disp: , Rfl:    potassium chloride  (MICRO-K ) 10 MEQ CR capsule, Take 2 capsules (20 mEq total) by mouth daily., Disp: 180 capsule, Rfl: 3   simvastatin  (ZOCOR ) 40 MG tablet, TAKE 1 TABLET AT BEDTIME., Disp: 90 tablet, Rfl: 3   Objective:     Vitals:   02/28/24 1031  BP: 110/60  Pulse: 61  SpO2: 98%  Height: 5' 7 (1.702 m)      Body mass index is 35.43 kg/m.    Physical Exam:    Gen: Appears well, nad, nontoxic and pleasant Psych: Alert and oriented, appropriate mood and affect Neuro: sensation intact, strength is 5/5 in upper and lower extremities, muscle tone wnl Skin: no susupicious lesions or rashes  Back - Normal skin, Spine with thoracic kyphosis alignment   tenderness to lumbar vertebral process palpation.   L1-L5 paraspinous muscles are   tender and without spasm NTTP gluteal musculature Straight leg raise negative Trendelenberg negative Piriformis Test negative Gait antalgic with slow steps   Electronically signed by:  [name] D.Joel King [facility] 11:57 AM 02/28/24

## 2024-02-28 ENCOUNTER — Ambulatory Visit

## 2024-02-28 ENCOUNTER — Ambulatory Visit: Admitting: Sports Medicine

## 2024-02-28 VITALS — BP 110/60 | HR 61 | Ht 67.0 in

## 2024-02-28 DIAGNOSIS — M545 Low back pain, unspecified: Secondary | ICD-10-CM

## 2024-02-28 DIAGNOSIS — W19XXXA Unspecified fall, initial encounter: Secondary | ICD-10-CM

## 2024-02-28 DIAGNOSIS — S32010A Wedge compression fracture of first lumbar vertebra, initial encounter for closed fracture: Secondary | ICD-10-CM | POA: Diagnosis not present

## 2024-02-28 DIAGNOSIS — G8929 Other chronic pain: Secondary | ICD-10-CM

## 2024-02-28 MED ORDER — MELOXICAM 15 MG PO TABS: 15.0000 mg | ORAL_TABLET | Freq: Every day | ORAL | 0 refills | Status: AC

## 2024-02-28 NOTE — Patient Instructions (Addendum)
 Good to see you   Lumbar MRI ordered to [facility] on [address] [phone]  - Start meloxicam 15 mg daily x2 weeks.  If still having pain after 2 weeks, complete 3rd-week of NSAID. May use remaining NSAID as needed once daily for pain control.  Do not to use additional over-the-counter NSAIDs (ibuprofen, naproxen , Advil, Aleve , etc.) while taking prescription NSAIDs.  May use Tylenol [phone] mg 2 to 3 times a day for breakthrough pain.  Follow up 1 week after MRI

## 2024-02-29 ENCOUNTER — Telehealth: Payer: Self-pay | Admitting: Sports Medicine

## 2024-02-29 NOTE — Telephone Encounter (Signed)
 Patient called and stated that he took meloxicam last night and it helped and he got up for work this morning and then he got a pain in his back like a knife sticking in him. He called out of work again today. He wants to know if he can take meloxicam every 12 hours. He would like to take one in the morning and one at night. He took tylenol as well and it helped a little but not much. He asked if he can not do that, is there anything else that can help him because he has got to go to work. Please advise.

## 2024-02-29 NOTE — Telephone Encounter (Signed)
 Messaged patient via mychart and called

## 2024-03-06 ENCOUNTER — Ambulatory Visit: Payer: Self-pay | Admitting: Sports Medicine

## 2024-03-15 ENCOUNTER — Inpatient Hospital Stay: Admission: RE | Admit: 2024-03-15 | Discharge: 2024-03-15 | Attending: Sports Medicine

## 2024-03-15 DIAGNOSIS — G8929 Other chronic pain: Secondary | ICD-10-CM

## 2024-03-16 ENCOUNTER — Encounter: Payer: Self-pay | Admitting: Pharmacist

## 2024-03-16 NOTE — Progress Notes (Signed)
 Pharmacy Quality Measure Review  This patient is appearing on a report for being at risk of failing the adherence measure for cholesterol (statin) and hypertension (ACEi/ARB) medications this calendar year.   Medication: Benazepril  Last fill date: 02/19/24 for 90 day supply  Medication: Simvastatin  Last fill date: 02/26/24 for 90 day supply  Insurance report was not up to date. No action needed at this time.   Darrelyn Drum, PharmD, BCPS, CPP Clinical Pharmacist Practitioner [facility] [phone]

## 2024-03-19 ENCOUNTER — Encounter: Payer: Self-pay | Admitting: Internal Medicine

## 2024-03-19 ENCOUNTER — Ambulatory Visit: Admitting: Internal Medicine

## 2024-03-19 VITALS — BP 116/66 | HR 66 | Temp 98.5°F | Ht 67.0 in | Wt 208.0 lb

## 2024-03-19 DIAGNOSIS — R062 Wheezing: Secondary | ICD-10-CM | POA: Diagnosis not present

## 2024-03-19 DIAGNOSIS — E559 Vitamin D deficiency, unspecified: Secondary | ICD-10-CM

## 2024-03-19 DIAGNOSIS — I10 Essential (primary) hypertension: Secondary | ICD-10-CM | POA: Diagnosis not present

## 2024-03-19 DIAGNOSIS — R058 Other specified cough: Secondary | ICD-10-CM

## 2024-03-19 DIAGNOSIS — R7303 Prediabetes: Secondary | ICD-10-CM

## 2024-03-19 MED ORDER — HYDROCODONE BIT-HOMATROP MBR 5-1.5 MG/5ML PO SOLN: 5.0000 mL | Freq: Four times a day (QID) | ORAL | 0 refills | Status: AC | PRN

## 2024-03-19 MED ORDER — AZITHROMYCIN 250 MG PO TABS: ORAL_TABLET | ORAL | 1 refills | Status: AC

## 2024-03-19 MED ORDER — ALBUTEROL SULFATE HFA 108 (90 BASE) MCG/ACT IN AERS: 2.0000 | INHALATION_SPRAY | Freq: Four times a day (QID) | RESPIRATORY_TRACT | 0 refills | Status: AC | PRN

## 2024-03-19 MED ORDER — PREDNISONE 10 MG PO TABS: ORAL_TABLET | ORAL | 0 refills | Status: AC

## 2024-03-19 NOTE — Patient Instructions (Signed)
 Please take all new medication as prescribed - the antibiotic, cough medicine as needed, prednisone , inhaler  Please continue all other medications as before  Please have the pharmacy call with any other refills you may need.  Please keep your appointments with your specialists as you may have planned

## 2024-03-19 NOTE — Progress Notes (Unsigned)
 "               Joel King [name] [address] [place] [address] Phone: [phone]   Assessment and Plan:     1. Chronic midline low back pain without sciatica (Primary) 2. Closed compression fracture of body of L1 vertebra (HCC) 3. Fall, subsequent encounter -Chronic with exacerbation, subsequent visit - Overall improvement in low back pain from fall sustained 6 to 7 weeks ago leading to compression fracture at L1 with 30% vertebral height loss as seen on lumbar MRI.  Consistent with stabilizing L1 compression fracture based on patient's decreasing pain levels - Reviewed patient's MRI in clinic.  Discussed 30% vertebral body height loss of L1 due to acute/subacute compression fracture as well as moderate neuroforaminal narrowing at L4-L5 bilaterally and L3-L4 on right, L5-S1 on the left -Will continue with conservative therapy with goal of pain management and prevention of falls - If patient's pain significantly worsens, or if patient should have additional follow-up, would recommend neurosurgery evaluation - Use meloxicam  15 mg daily as needed for breakthrough pain.  Recommend limiting chronic NSAIDs to 1-2 doses per week to prevent long-term side effects. Use Tylenol 500 to 1000 mg tablets 2-3 times a day as needed for day-to-day pain relief.    - Patient has had benefit from meloxicam  use and Tylenol use, but he is interested in chronic pain medication to better control chronic pain that was present even before his more recent fall.  I provided the option of using Tylenol for day-to-day pain relief with NSAIDs as needed, but he does not feel this will be beneficial in controlling pain long-term and is interested in pain management referral.  Referral sent  Pertinent previous records reviewed include lumbar MRI 03/15/2024 IMPRESSION: 1. Acute/subacute L1 compression fracture with 30% vertebral body height loss. 2. Moderate neural foraminal narrowing at  L4-5 bilaterally, L3-4 on the right, and L5-S1 on the left. 3. Mild spinal stenosis at L2-L3.   Follow Up: As needed   Subjective:   I, Joel King, am serving as a neurosurgeon for Doctor Morene King  Chief Complaint: right shoulder pain    HPI:    07/13/2023 Patient is a 73 year old male with shoulder pain. Patient states was seen by PCP 07/05/2023 RAI Joel King is a 73 y.o. male here with c/o fall off a step stool only 1 foot but landed on lateral right upper arm and shoulder x 3 mo ago, had bruising now resolved, but still stiff and sore.  Does have FROM and has not missed any work.  Pt denies chest pain, increased sob or doe, wheezing, orthopnea, PND, increased LE swelling, palpitations, dizziness or syncope.   Pt denies polydipsia, polyuria, or new focal neuro s/s.    Pt denies fever, wt loss, night sweats, loss of appetite, or other constitutional symptoms    Pain patches and cream help with his pain. Has some pain with ROM .  Would like to discuss CSI. He can lift things up to 20 pounds    02/28/24 Today patient states that the ortho surgeon wants to put a rod in his back. Taking Advil. He was turning around in the kitchen and fell. He did a twisting motion on the way down. His fall was about about a month on a Wednesday.  03/20/2024 Patient states back pain he had an MRI 03/15/2024 meds really helped   Relevant Historical Information: Hypertension, history of DVT, bilateral pitting  edema, prediabetes   Additional pertinent review of systems negative.  Current Medications[1]   Objective:     Vitals:   03/20/24 1301  BP: 116/66  Pulse: 66  SpO2: 95%  Weight: 208 lb (94.3 kg)  Height: 5' 7 (1.702 m)      Body mass index is 32.58 kg/m.    Physical Exam:    Gen: Appears well, nad, nontoxic and pleasant Psych: Alert and oriented, appropriate mood and affect Neuro: sensation intact, strength is 5/5 in upper and lower extremities, muscle tone wnl Skin: no  susupicious lesions or rashes   Back - Normal skin, Spine with thoracic kyphosis alignment   tenderness to lumbar vertebral process palpation.   L1-L5 paraspinous muscles are mildly tender and without spasm NTTP gluteal musculature Straight leg raise negative Trendelenberg negative Piriformis Test negative Gait antalgic with slow steps    Electronically signed by:  Joel King [name] 1:34 PM 03/20/2024     [1]  Current Outpatient Medications:    acetaminophen (TYLENOL) 500 MG tablet, Take 500 mg by mouth every 6 (six) hours as needed., Disp: , Rfl:    amLODipine  (NORVASC ) 10 MG tablet, Take 1 tablet (10 mg total) by mouth daily., Disp: 90 tablet, Rfl: 3   aspirin 81 MG tablet, Take 81 mg by mouth daily., Disp: , Rfl:    atenolol -chlorthalidone  (TENORETIC ) 100-25 MG tablet, Take 1 tablet by mouth daily., Disp: 90 tablet, Rfl: 3   benazepril  (LOTENSIN ) 40 MG tablet, Take 1 tablet (40 mg total) by mouth daily., Disp: 90 tablet, Rfl: 3   calcium -vitamin D  (OSCAL WITH D) 500-5 MG-MCG tablet, Take 1 tablet by mouth., Disp: , Rfl:    celecoxib  (CELEBREX ) 200 MG capsule, Take 1 capsule (200 mg total) by mouth 2 (two) times daily as needed., Disp: 60 capsule, Rfl: 5   famotidine  (PEPCID ) 20 MG tablet, Take 1 tablet (20 mg total) by mouth 2 (two) times daily., Disp: 180 tablet, Rfl: 3   finasteride  (PROSCAR ) 5 MG tablet, Take 1 tablet (5 mg total) by mouth daily., Disp: 90 tablet, Rfl: 3   gabapentin  (NEURONTIN ) 300 MG capsule, Take 1 capsule (300 mg total) by mouth 3 (three) times daily., Disp: 90 capsule, Rfl: 11   meloxicam  (MOBIC ) 15 MG tablet, Take 1 tablet (15 mg total) by mouth daily., Disp: 30 tablet, Rfl: 0   meloxicam  (MOBIC ) 15 MG tablet, Take 1 tablet (15 mg total) by mouth daily as needed for pain., Disp: 30 tablet, Rfl: 0   Multiple Vitamins-Minerals (CENTRUM SILVER PO), Take by mouth daily at 6 (six) AM., Disp: , Rfl:    Multiple Vitamins-Minerals  (OCUVITE PRESERVISION PO), Take by mouth., Disp: , Rfl:    Na Sulfate-K Sulfate-Mg Sulf 17.5-3.13-1.6 GM/177ML SOLN, See admin instructions., Disp: , Rfl:    potassium chloride  (MICRO-K ) 10 MEQ CR capsule, Take 2 capsules (20 mEq total) by mouth daily., Disp: 180 capsule, Rfl: 3   simvastatin  (ZOCOR ) 40 MG tablet, TAKE 1 TABLET AT BEDTIME., Disp: 90 tablet, Rfl: 3   albuterol  (VENTOLIN  HFA) 108 (90 Base) MCG/ACT inhaler, Inhale 2 puffs into the lungs every 6 (six) hours as needed for wheezing or shortness of breath., Disp: 8 g, Rfl: 0   azithromycin  (ZITHROMAX ) 250 MG tablet, Take 2 tablets on day 1, then 1 tablet daily on days 2 through 5, Disp: 6 tablet, Rfl: 1   HYDROcodone  bit-homatropine (HYCODAN) 5-1.5 MG/5ML syrup, Take 5 mLs by mouth every 6 (six) hours  as needed for up to 10 days., Disp: 180 mL, Rfl: 0   predniSONE  (DELTASONE ) 10 MG tablet, 3 tabs by mouth per day for 3 days,2tabs per day for 3 days,1tab per day for 3 days, Disp: 18 tablet, Rfl: 0  "

## 2024-03-19 NOTE — Progress Notes (Unsigned)
 Patient ID: Joel King, male   DOB: May 29, 1950, 73 y.o.   MRN: [id number]        Chief Complaint: follow up prod cough, wheezing with sob, preDM, low vit d       HPI:  Joel King is a 73 y.o. male Here with acute onset mild to mod 2-3 days ST, HA, general weakness and malaise, with prod cough greenish sputum, but Pt denies chest pain,orthopnea, PND, increased LE swelling, palpitations, dizziness or syncope, except for onset mild wheezing with sob yesterday.    Pt denies polydipsia, polyuria, or new focal neuro s/s.    Pt denies recent wt loss, night sweats, loss of appetite, or other constitutional symptoms       Wt Readings from Last 3 Encounters:  03/20/24 208 lb (94.3 kg)  03/19/24 208 lb (94.3 kg)  11/09/23 226 lb 3.2 oz (102.6 kg)   BP Readings from Last 3 Encounters:  03/20/24 116/66  03/19/24 116/66  02/28/24 110/60         Past Medical History:  Diagnosis Date   ANXIETY 11/21/2006   Cancer (HCC)    skin cancer left cheek   DEPRESSION 05/31/2008   DIABETES MELLITUS, TYPE II 07/22/2007   no meds   Diarrhea 05/31/2008   DVT (deep venous thrombosis) (HCC) 02/08/2014   HYPERLIPIDEMIA 11/21/2006   HYPERTENSION 11/17/2006   INSOMNIA-SLEEP DISORDER-UNSPEC 07/21/2007   OBESITY, MILD 11/17/2006   PEPTIC ULCER DISEASE 07/22/2007   PEPTIC ULCER DISEASE, HX OF 11/17/2006   Post-operative nausea and vomiting    past 2 surgeries   SINUSITIS- ACUTE-NOS 12/08/2007   SKIN LESION 12/08/2007   Past Surgical History:  Procedure Laterality Date   inguinal herniorrhapy     bilat   s/p incarcerated recurrent ventral hernia     s/p meckel's diverticulum      reports that he has never smoked. He has never used smokeless tobacco. He reports that he does not drink alcohol and does not use drugs. family history includes COPD in his brother; Cancer in an other family member; Heart disease in his father and mother; Stroke in an other family member. Allergies[1] Medications Ordered Prior  to Encounter[2]      ROS:  All others reviewed and negative.  Objective        PE:  BP 116/66 (BP Location: Left Arm, Patient Position: Sitting, Cuff Size: Normal)   Pulse 66   Temp 98.5 F (36.9 C) (Oral)   Ht 5' 7 (1.702 m)   Wt 208 lb (94.3 kg)   SpO2 98%   BMI 32.58 kg/m                 Constitutional: Pt appears mild ill               HENT: Head: NCAT.                Right Ear: External ear normal.                 Left Ear: External ear normal. Bilat tm's with mild erythema.  Max sinus areas mild tender.  Pharynx with mild erythema, no exudate               Eyes: . Pupils are equal, round, and reactive to light. Conjunctivae and EOM are normal               Nose: without d/c or deformity  Neck: Neck supple. Gross normal ROM               Cardiovascular: Normal rate and regular rhythm.                 Pulmonary/Chest: Effort normal and breath sounds without rales but with few mild bilateral wheezing.                Abd:  Soft, NT, ND, + BS, no organomegaly               Neurological: Pt is alert. At baseline orientation, motor grossly intact               Skin: Skin is warm. No rashes, no other new lesions, LE edema - none               Psychiatric: Pt behavior is normal without agitation   Micro: none  Cardiac tracings I have personally interpreted today:  none  Pertinent Radiological findings (summarize): none   Lab Results  Component Value Date   WBC 11.6 (H) 10/03/2023   HGB 15.4 10/03/2023   HCT 44.4 10/03/2023   PLT 201.0 10/03/2023   GLUCOSE 116 (H) 10/03/2023   CHOL 134 10/03/2023   TRIG 89.0 10/03/2023   HDL 35.10 (L) 10/03/2023   LDLDIRECT 65.0 11/01/2022   LDLCALC 81 10/03/2023   ALT 15 10/03/2023   AST 18 10/03/2023   NA 137 10/03/2023   K 3.2 (L) 10/03/2023   CL 98 10/03/2023   CREATININE 0.72 10/03/2023   BUN 15 10/03/2023   CO2 28 10/03/2023   TSH 0.97 10/03/2023   PSA 1.00 10/03/2023   HGBA1C 5.8 10/03/2023    Assessment/Plan:  Joel King is a 73 y.o. White or Caucasian [1] male with  has a past medical history of ANXIETY (11/21/2006), Cancer [facility]), DEPRESSION (05/31/2008), DIABETES MELLITUS, TYPE II (07/22/2007), Diarrhea (05/31/2008), DVT (deep venous thrombosis) (HCC) (02/08/2014), HYPERLIPIDEMIA (11/21/2006), HYPERTENSION (11/17/2006), INSOMNIA-SLEEP DISORDER-UNSPEC (07/21/2007), OBESITY, MILD (11/17/2006), PEPTIC ULCER DISEASE (07/22/2007), PEPTIC ULCER DISEASE, HX OF (11/17/2006), Post-operative nausea and vomiting, SINUSITIS- ACUTE-NOS (12/08/2007), and SKIN LESION (12/08/2007).  Vitamin D  deficiency Last vitamin D  Lab Results  Component Value Date   VD25OH 49.57 10/03/2023   Stable, cont oral replacement   Pre-diabetes Lab Results  Component Value Date   HGBA1C 5.8 10/03/2023   Stable, pt to continue current medical treatment  - diet, wt control   Essential hypertension BP Readings from Last 3 Encounters:  03/20/24 116/66  03/19/24 116/66  02/28/24 110/60   Stable, pt to continue medical treatment norvasc  10 every day, tenoretic  100 25 every day, lotensin  40 qd    Productive cough Mild to mod, c/w bronchitis vs pna,  declines cxr, for antibx course zpack, cough med prn,  to f/u any worsening symptoms or concerns   Wheezing Mild to mod, for prednisone  taper, inhaler prn,  to f/u any worsening symptoms or concerns  Followup: Return if symptoms worsen or fail to improve.  Lynwood Rush, MD 03/20/2024 2:47 PM [facility]  Primary Care - [facility]     [1]  Allergies Allergen Reactions   Atorvastatin     Unsure of results   Pravastatin Sodium     Unsure of reaction   Rosuvastatin      Unsure of reaction  [2]  Current Outpatient Medications on File Prior to Visit  Medication Sig Dispense Refill   acetaminophen (TYLENOL) 500 MG tablet Take 500 mg  by mouth every 6 (six) hours as needed.     amLODipine  (NORVASC ) 10 MG tablet  Take 1 tablet (10 mg total) by mouth daily. 90 tablet 3   aspirin 81 MG tablet Take 81 mg by mouth daily.     atenolol -chlorthalidone  (TENORETIC ) 100-25 MG tablet Take 1 tablet by mouth daily. 90 tablet 3   benazepril  (LOTENSIN ) 40 MG tablet Take 1 tablet (40 mg total) by mouth daily. 90 tablet 3   calcium -vitamin D  (OSCAL WITH D) 500-5 MG-MCG tablet Take 1 tablet by mouth.     celecoxib  (CELEBREX ) 200 MG capsule Take 1 capsule (200 mg total) by mouth 2 (two) times daily as needed. 60 capsule 5   famotidine  (PEPCID ) 20 MG tablet Take 1 tablet (20 mg total) by mouth 2 (two) times daily. 180 tablet 3   finasteride  (PROSCAR ) 5 MG tablet Take 1 tablet (5 mg total) by mouth daily. 90 tablet 3   gabapentin  (NEURONTIN ) 300 MG capsule Take 1 capsule (300 mg total) by mouth 3 (three) times daily. 90 capsule 11   meloxicam  (MOBIC ) 15 MG tablet Take 1 tablet (15 mg total) by mouth daily. 30 tablet 0   Multiple Vitamins-Minerals (CENTRUM SILVER PO) Take by mouth daily at 6 (six) AM.     Multiple Vitamins-Minerals (OCUVITE PRESERVISION PO) Take by mouth.     Na Sulfate-K Sulfate-Mg Sulf 17.5-3.13-1.6 GM/177ML SOLN See admin instructions.     potassium chloride  (MICRO-K ) 10 MEQ CR capsule Take 2 capsules (20 mEq total) by mouth daily. 180 capsule 3   simvastatin  (ZOCOR ) 40 MG tablet TAKE 1 TABLET AT BEDTIME. 90 tablet 3   No current facility-administered medications on file prior to visit.

## 2024-03-20 ENCOUNTER — Ambulatory Visit: Admitting: Sports Medicine

## 2024-03-20 ENCOUNTER — Encounter: Payer: Self-pay | Admitting: Internal Medicine

## 2024-03-20 VITALS — BP 116/66 | HR 66 | Ht 67.0 in | Wt 208.0 lb

## 2024-03-20 DIAGNOSIS — G8929 Other chronic pain: Secondary | ICD-10-CM | POA: Diagnosis not present

## 2024-03-20 DIAGNOSIS — R062 Wheezing: Secondary | ICD-10-CM | POA: Insufficient documentation

## 2024-03-20 DIAGNOSIS — M545 Low back pain, unspecified: Secondary | ICD-10-CM

## 2024-03-20 DIAGNOSIS — S32010A Wedge compression fracture of first lumbar vertebra, initial encounter for closed fracture: Secondary | ICD-10-CM | POA: Diagnosis not present

## 2024-03-20 DIAGNOSIS — W19XXXD Unspecified fall, subsequent encounter: Secondary | ICD-10-CM | POA: Diagnosis not present

## 2024-03-20 DIAGNOSIS — R058 Other specified cough: Secondary | ICD-10-CM | POA: Insufficient documentation

## 2024-03-20 MED ORDER — MELOXICAM 15 MG PO TABS: 15.0000 mg | ORAL_TABLET | Freq: Every day | ORAL | 0 refills | Status: AC | PRN

## 2024-03-20 NOTE — Patient Instructions (Addendum)
 Pain management referral  - Use meloxicam  15 mg daily as needed for breakthrough pain.  Recommend limiting chronic NSAIDs to 1-2 doses per week to prevent long-term side effects. Use Tylenol 500 to 1000 mg tablets 2-3 times a day as needed for day-to-day pain relief.    Refill meloxicam    As needed follow up

## 2024-03-20 NOTE — Assessment & Plan Note (Signed)
 BP Readings from Last 3 Encounters:  03/20/24 116/66  03/19/24 116/66  02/28/24 110/60   Stable, pt to continue medical treatment norvasc  10 every day, tenoretic  100 25 every day, lotensin  40 qd

## 2024-03-20 NOTE — Assessment & Plan Note (Signed)
 Lab Results  Component Value Date   HGBA1C 5.8 10/03/2023   Stable, pt to continue current medical treatment  - diet, wt control

## 2024-03-20 NOTE — Assessment & Plan Note (Signed)
 Last vitamin D  Lab Results  Component Value Date   VD25OH 49.57 10/03/2023   Stable, cont oral replacement

## 2024-03-20 NOTE — Assessment & Plan Note (Signed)
 Mild to mod, for prednisone taper, inhaler prn,  to f/u any worsening symptoms or concerns

## 2024-03-20 NOTE — Assessment & Plan Note (Signed)
 Mild to mod, c/w  bronchitis vs pna, declines cxr,  for antibx course zpack, cough med prn,  to f/u any worsening symptoms or concerns

## 2024-03-26 ENCOUNTER — Ambulatory Visit: Admitting: Orthopedic Surgery

## 2024-05-14 ENCOUNTER — Ambulatory Visit: Admitting: Internal Medicine

## 2024-06-15 ENCOUNTER — Encounter: Admitting: Family Medicine

## 2024-07-20 ENCOUNTER — Ambulatory Visit

## 2024-11-09 ENCOUNTER — Encounter: Admitting: Internal Medicine
